# Patient Record
Sex: Female | Born: 1970 | Race: Black or African American | Hispanic: No | Smoking: Never smoker
Health system: Southern US, Community
[De-identification: ages and names within clinical notes are randomized; demographics above are authoritative.]

## PROBLEM LIST (undated history)

## (undated) DIAGNOSIS — B009 Herpesviral infection, unspecified: Secondary | ICD-10-CM

## (undated) DIAGNOSIS — Z9109 Other allergy status, other than to drugs and biological substances: Secondary | ICD-10-CM

## (undated) DIAGNOSIS — F419 Anxiety disorder, unspecified: Secondary | ICD-10-CM

## (undated) DIAGNOSIS — T7840XA Allergy, unspecified, initial encounter: Secondary | ICD-10-CM

## (undated) DIAGNOSIS — M1990 Unspecified osteoarthritis, unspecified site: Secondary | ICD-10-CM

## (undated) DIAGNOSIS — Z8619 Personal history of other infectious and parasitic diseases: Secondary | ICD-10-CM

## (undated) DIAGNOSIS — N898 Other specified noninflammatory disorders of vagina: Secondary | ICD-10-CM

## (undated) DIAGNOSIS — T192XXA Foreign body in vulva and vagina, initial encounter: Secondary | ICD-10-CM

## (undated) DIAGNOSIS — K429 Umbilical hernia without obstruction or gangrene: Secondary | ICD-10-CM

## (undated) DIAGNOSIS — Z862 Personal history of diseases of the blood and blood-forming organs and certain disorders involving the immune mechanism: Secondary | ICD-10-CM

## (undated) DIAGNOSIS — N926 Irregular menstruation, unspecified: Secondary | ICD-10-CM

## (undated) DIAGNOSIS — Z309 Encounter for contraceptive management, unspecified: Secondary | ICD-10-CM

## (undated) DIAGNOSIS — D649 Anemia, unspecified: Secondary | ICD-10-CM

## (undated) HISTORY — DX: Other specified noninflammatory disorders of vagina: N89.8

## (undated) HISTORY — DX: Other allergy status, other than to drugs and biological substances: Z91.09

## (undated) HISTORY — DX: Allergy, unspecified, initial encounter: T78.40XA

## (undated) HISTORY — DX: Foreign body in vulva and vagina, initial encounter: T19.2XXA

## (undated) HISTORY — DX: Anxiety disorder, unspecified: F41.9

## (undated) HISTORY — PX: HERNIA REPAIR: SHX51

## (undated) HISTORY — PX: CERVICAL CERCLAGE: SHX1329

## (undated) HISTORY — DX: Unspecified osteoarthritis, unspecified site: M19.90

## (undated) HISTORY — DX: Umbilical hernia without obstruction or gangrene: K42.9

## (undated) HISTORY — DX: Irregular menstruation, unspecified: N92.6

## (undated) HISTORY — DX: Personal history of diseases of the blood and blood-forming organs and certain disorders involving the immune mechanism: Z86.2

## (undated) HISTORY — DX: Personal history of other infectious and parasitic diseases: Z86.19

## (undated) HISTORY — DX: Encounter for contraceptive management, unspecified: Z30.9

## (undated) HISTORY — DX: Herpesviral infection, unspecified: B00.9

## (undated) HISTORY — DX: Anemia, unspecified: D64.9

---

## 2001-09-03 ENCOUNTER — Emergency Department (HOSPITAL_COMMUNITY): Admission: EM | Admit: 2001-09-03 | Discharge: 2001-09-03 | Payer: Self-pay | Admitting: *Deleted

## 2001-12-27 ENCOUNTER — Emergency Department (HOSPITAL_COMMUNITY): Admission: EM | Admit: 2001-12-27 | Discharge: 2001-12-27 | Payer: Self-pay | Admitting: *Deleted

## 2002-01-31 ENCOUNTER — Inpatient Hospital Stay (HOSPITAL_COMMUNITY): Admission: EM | Admit: 2002-01-31 | Discharge: 2002-01-31 | Payer: Self-pay | Admitting: *Deleted

## 2002-06-12 ENCOUNTER — Encounter: Payer: Self-pay | Admitting: Emergency Medicine

## 2002-06-12 ENCOUNTER — Emergency Department (HOSPITAL_COMMUNITY): Admission: EM | Admit: 2002-06-12 | Discharge: 2002-06-12 | Payer: Self-pay | Admitting: Emergency Medicine

## 2002-07-26 ENCOUNTER — Emergency Department (HOSPITAL_COMMUNITY): Admission: EM | Admit: 2002-07-26 | Discharge: 2002-07-26 | Payer: Self-pay | Admitting: Internal Medicine

## 2003-05-07 ENCOUNTER — Observation Stay (HOSPITAL_COMMUNITY): Admission: AD | Admit: 2003-05-07 | Discharge: 2003-05-08 | Payer: Self-pay | Admitting: Obstetrics and Gynecology

## 2003-05-30 ENCOUNTER — Ambulatory Visit (HOSPITAL_COMMUNITY): Admission: RE | Admit: 2003-05-30 | Discharge: 2003-05-31 | Payer: Self-pay | Admitting: Obstetrics and Gynecology

## 2003-05-31 ENCOUNTER — Ambulatory Visit (HOSPITAL_COMMUNITY): Admission: AD | Admit: 2003-05-31 | Discharge: 2003-05-31 | Payer: Self-pay | Admitting: Obstetrics and Gynecology

## 2003-07-11 ENCOUNTER — Ambulatory Visit (HOSPITAL_COMMUNITY): Admission: AD | Admit: 2003-07-11 | Discharge: 2003-07-11 | Payer: Self-pay | Admitting: Obstetrics and Gynecology

## 2003-08-15 ENCOUNTER — Ambulatory Visit (HOSPITAL_COMMUNITY): Admission: RE | Admit: 2003-08-15 | Discharge: 2003-08-15 | Payer: Self-pay | Admitting: Obstetrics and Gynecology

## 2003-08-31 ENCOUNTER — Inpatient Hospital Stay (HOSPITAL_COMMUNITY): Admission: AD | Admit: 2003-08-31 | Discharge: 2003-09-02 | Payer: Self-pay | Admitting: Obstetrics and Gynecology

## 2004-09-20 ENCOUNTER — Ambulatory Visit (HOSPITAL_COMMUNITY): Admission: RE | Admit: 2004-09-20 | Discharge: 2004-09-20 | Payer: Self-pay | Admitting: Obstetrics and Gynecology

## 2004-10-02 ENCOUNTER — Emergency Department (HOSPITAL_COMMUNITY): Admission: EM | Admit: 2004-10-02 | Discharge: 2004-10-02 | Payer: Self-pay | Admitting: *Deleted

## 2005-02-20 ENCOUNTER — Inpatient Hospital Stay (HOSPITAL_COMMUNITY): Admission: RE | Admit: 2005-02-20 | Discharge: 2005-02-22 | Payer: Self-pay | Admitting: Obstetrics and Gynecology

## 2007-07-25 ENCOUNTER — Other Ambulatory Visit: Admission: RE | Admit: 2007-07-25 | Discharge: 2007-07-25 | Payer: Self-pay | Admitting: Obstetrics and Gynecology

## 2007-12-12 ENCOUNTER — Other Ambulatory Visit: Admission: RE | Admit: 2007-12-12 | Discharge: 2007-12-12 | Payer: Self-pay | Admitting: Obstetrics and Gynecology

## 2009-02-04 ENCOUNTER — Other Ambulatory Visit: Admission: RE | Admit: 2009-02-04 | Discharge: 2009-02-04 | Payer: Self-pay | Admitting: Obstetrics and Gynecology

## 2009-02-13 ENCOUNTER — Emergency Department (HOSPITAL_COMMUNITY): Admission: EM | Admit: 2009-02-13 | Discharge: 2009-02-13 | Payer: Self-pay | Admitting: Emergency Medicine

## 2009-04-16 ENCOUNTER — Ambulatory Visit (HOSPITAL_COMMUNITY): Admission: RE | Admit: 2009-04-16 | Discharge: 2009-04-16 | Payer: Self-pay | Admitting: Obstetrics and Gynecology

## 2009-05-17 ENCOUNTER — Ambulatory Visit: Payer: Self-pay | Admitting: Obstetrics and Gynecology

## 2009-05-17 ENCOUNTER — Inpatient Hospital Stay (HOSPITAL_COMMUNITY): Admission: AD | Admit: 2009-05-17 | Discharge: 2009-05-18 | Payer: Self-pay | Admitting: Obstetrics and Gynecology

## 2009-09-14 ENCOUNTER — Ambulatory Visit: Payer: Self-pay | Admitting: Obstetrics & Gynecology

## 2009-09-14 ENCOUNTER — Inpatient Hospital Stay (HOSPITAL_COMMUNITY): Admission: AD | Admit: 2009-09-14 | Discharge: 2009-09-17 | Payer: Self-pay | Admitting: Obstetrics & Gynecology

## 2010-06-23 ENCOUNTER — Other Ambulatory Visit: Admission: RE | Admit: 2010-06-23 | Discharge: 2010-06-23 | Payer: Self-pay | Admitting: Obstetrics and Gynecology

## 2011-01-11 LAB — CBC
HCT: 31.1 % — ABNORMAL LOW (ref 36.0–46.0)
HCT: 36.5 % (ref 36.0–46.0)
Hemoglobin: 10.3 g/dL — ABNORMAL LOW (ref 12.0–15.0)
MCHC: 32.9 g/dL (ref 30.0–36.0)
Platelets: 197 10*3/uL (ref 150–400)
RBC: 3.54 MIL/uL — ABNORMAL LOW (ref 3.87–5.11)
RBC: 4.2 MIL/uL (ref 3.87–5.11)
WBC: 11.4 10*3/uL — ABNORMAL HIGH (ref 4.0–10.5)

## 2011-01-16 LAB — CBC
Hemoglobin: 12.6 g/dL (ref 12.0–15.0)
MCHC: 34.3 g/dL (ref 30.0–36.0)
MCV: 83.3 fL (ref 78.0–100.0)
RBC: 4.39 MIL/uL (ref 3.87–5.11)

## 2011-01-16 LAB — PROTIME-INR: Prothrombin Time: 13.2 seconds (ref 11.6–15.2)

## 2011-01-18 LAB — URINALYSIS, ROUTINE W REFLEX MICROSCOPIC
Bilirubin Urine: NEGATIVE
Glucose, UA: NEGATIVE mg/dL
Hgb urine dipstick: NEGATIVE
Ketones, ur: NEGATIVE mg/dL
Protein, ur: NEGATIVE mg/dL
Urobilinogen, UA: 0.2 mg/dL (ref 0.0–1.0)

## 2011-02-22 NOTE — Op Note (Signed)
NAMELUCIEL, BRICKMAN NO.:  [id number]   MEDICAL RECORD NO.:  [id number]          PATIENT TYPE:  AMB   LOCATION:  DAY                           FACILITY:  [facility]   PHYSICIAN:  Tilda Burrow, M.D. DATE OF BIRTH:  September 27, 1971   DATE OF PROCEDURE:  04/16/2009  DATE OF DISCHARGE:                               OPERATIVE REPORT   PREOPERATIVE DIAGNOSES:  Pregnancy at [redacted] weeks gestation, history of  cervical incompetency, status post prior pregnancies with McDonald  cerclage.   POSTOPERATIVE DIAGNOSES:  Pregnancy at 37 weeks' gestation, history of  cervical incompetency, status post prior pregnancies with McDonald  cerclage.   PROCEDURE:  McDonald cerclage placement.   SURGEON:  Tilda Burrow, MD   ASSISTANTMarlinda Mike, RN   ANESTHESIA:  Spinal.   COMPLICATIONS:  None.   FINDINGS:  Soft, bulbous, multiparous cervix, visually appears closed.   DETAILS OF THE PROCEDURE:  The patient was taken to the operating room,  prepped and draped for vaginal procedure after spinal anesthesia  introduced.  The legs were placed in high-lithotomy support.  Perineum  was prepped and draped, and after time-out was conducted, procedure was  confirmed, agreed upon by all parties, and IV antibiotics administered.  A Cowles speculum was inserted in the vagina, allowing visualization of  the cervix, which was soft, multiparous, and bulbous.  The cervix was  circumscribed with a clockwise placed suture of 0 Vicryl.  The 0 Vicryl  was tied down at approximately 11 o'clock around the cervix with some  reduction in the multiparous size of the cervix.  The patient tolerated  this well.  Once the suture had been tied down, we inspected for  hemostasis, confirmed once again, there was no significant additional  bleeding.  The amount of light bleeding that occurred less  than 25 mL had been cleansed out of the surgical field.  The patient  tolerated the procedure well with no discomfort  noted in recovery.  Fetal Rh will be confirmed in the recovery prior to discharge.  Received  a single dose of Procardia XL 30 mg prior to discharge.      Tilda Burrow, M.D.  Electronically Signed     JVF/MEDQ  D:  04/16/2009  T:  04/17/2009  Job:  [id number]   cc:   [facility]

## 2011-02-22 NOTE — H&P (Signed)
Donna [name], TAKEMOTO NO.:  [id number]   MEDICAL RECORD NO.:  [id number]          PATIENT TYPE:  AMB   LOCATION:  DAY                           FACILITY:  [facility]   PHYSICIAN:  Tilda Burrow, M.D. DATE OF BIRTH:  Feb 14, 1971   DATE OF ADMISSION:  DATE OF DISCHARGE:  LH                              HISTORY & PHYSICAL   ADMITTING DIAGNOSES:  Pregnancy 17 weeks' gestation, history of cervical  incompetency scheduled for McDonald cerclage.   HISTORY OF PRESENT ILLNESS:  This 40 year old female gravida 6, para 2,  AB 4 with 2 living children is admitted at this time for placement of  McDonald cerclage.  Prenatal course has been followed through [facility] with pregnancy course notable for blood type O positive.  Rubella immunity present.  Varicella immunity present.  Cystic fibrosis  screen negative.  Hemoglobin 12, hematocrit 37, platelets 289,000.  Hepatitis, HIV, RPR, GC, and Chlamydia all negative.  Sickledex is also  negative.  Due to the history of cervical incompetency, the patient is  scheduled for McDonald cerclage.  Prior pregnancies have had the  cerclage placed at 22 weeks with 2004 pregnancy when she was presented  with funneling of the cervix.  She had had a prior late first trimester  miscarriage which presented with bulging bag of waters with minimal  discomfort.  The final pregnancy in 2006 was addressed with McDonald  cerclage prophylactically at 37 weeks and she had the cerclage removed  at 37 weeks and delivered at 38-1/2 weeks.  She is admitted at this time  for McDonald cerclage placement.  The procedure was delayed 2 weeks due  to ultrasound at 14 to 15 weeks which showed the placenta remained low  lying position, not involving the previa.  Ultrasound on April 15, 2009  shows the placenta is at least 4 cm away from the internal os and there  is no funneling present.  The cervical length was 3.73 cm at this time.  McDonald cerclage is  scheduled.   PAST MEDICAL HISTORY:  Benign.   SURGICAL HISTORY:  McDonald cerclage x2.   PHYSICAL EXAMINATION:  GENERAL:  A healthy African American female  alert, oriented x3.  VITAL SIGNS:  Height 5.1, weight 156, blood pressure 120/80, pulse 80.  EYES:  Pupils equal, round, and reactive.  CARDIOVASCULAR:  Unremarkable.  ABDOMEN:  Nontender.  Uterine size consistent 17 weeks.  Ultrasound  confirms vertex presentation.  Fetal heart position noted and feral  activity noted.  The cervix is closed at 3 cm length.   PLAN:  McDonald cerclage single stitch to be placed on April 16, 2009, at  11 a.m.      Tilda Burrow, M.D.  Electronically Signed     JVF/MEDQ  D:  04/15/2009  T:  04/16/2009  Job:  [id number]   cc:   [facility]

## 2011-02-25 NOTE — Op Note (Signed)
NAMEADILENY, Donna [name]                         ACCOUNT NO.:  [id number]   MEDICAL RECORD NO.:  [id number]                   PATIENT TYPE:  INP   LOCATION:  A416                                 FACILITY:  [facility]   PHYSICIAN:  Tilda Burrow, M.D.              DATE OF BIRTH:  Feb 17, 1971   DATE OF PROCEDURE:  08/31/2003  DATE OF DISCHARGE:                                 OPERATIVE REPORT   DELIVERY TIME:  Noon, approximately.   LABOR SUMMARY AND DELIVERY NOTE:  [name] progressed steadily through the  morning in spontaneous labor.  She had a single variable deceleration  shortly after her membrane rupture but otherwise, the heart rate tracing  remained excellent with only mild variables, not clinically of concern.  The  patient progressed to completely dilated and pushed through a brief second  stage of less than 30 minutes, delivering over an intact perineum, direct OA  position, a healthy female infant, weight _______, Apgars 9 and 9.  Amniotic  fluid was clear.  Placenta was then delivered easily, Schultze presentation,  membranes apparently intact.  First-degree lacerations did not require  suture repair.  Delivery was attended by the patient's partner, Dorann Ou  and one other family member with good support and good family interaction.      ___________________________________________                                            Tilda Burrow, M.D.   JVF/MEDQ  D:  08/31/2003  T:  08/31/2003  Job:  [id number]   cc:   Francoise Schaumann. Halm, D.O.  [address]  [place]  [place] [address]  Fax: [phone]

## 2011-02-25 NOTE — Op Note (Signed)
NAMEKEVA, DARTY NO.:  [id number]   MEDICAL RECORD NO.:  [id number]          PATIENT TYPE:  AMB   LOCATION:  DAY                           FACILITY:  [facility]   PHYSICIAN:  Tilda Burrow, M.D. DATE OF BIRTH:  Feb 11, 1971   DATE OF PROCEDURE:  09/20/2004  DATE OF DISCHARGE:                                 OPERATIVE REPORT   PREOPERATIVE DIAGNOSES:  1.  Pregnancy of 17 weeks.  2.  History of cervix incompetency.   POSTOPERATIVE DIAGNOSES:  1.  Pregnancy of 17 weeks.  2.  History of cervix incompetency.   PROCEDURE:  McDonald's cerclage.   SURGEON:  Tilda Burrow, M.D.   ASSISTANT:  Chinita Pester, C.S.T.   ANESTHESIA:  Spinal, Tyrone Nine.   COMPLICATIONS:  None.   ESTIMATED BLOOD LOSS:  Minimal.   FINDINGS:  Cervix soft, closed.   INDICATIONS FOR PROCEDURE:  A 40 year old female with a history of cervical  incompetency requiring a cerclage in the past.   BLOOD TYPE:  O-positive.   DESCRIPTION OF PROCEDURE:  The patient was taken to the operating room.  Spinal anesthesia was introduced.  The patient was prepped and draped with  low lithotomy leg support.  A large Duskin speculum was inserted into the  vagina which had been previously prepped with Betadine solution.  The cervix  was then encircled with an elliptical stitch of #1 Prolene thrown in a  series of pursestring-type sutures, with knot tied down, snugging  up the cervix and showing minimal bleeding associated with the procedure.  It was felt that one stitch was sufficient, and the patient was therefore  allowed to go to the recovery room.  Again, the blood type is O-positive.     John   JVF/MEDQ  D:  09/20/2004  T:  09/20/2004  Job:  [id number]

## 2011-02-25 NOTE — H&P (Signed)
NAME:  Donna [name], Donna [name] NO.:  [id number]   MEDICAL RECORD NO.:  [id number]                   PATIENT TYPE:  INP   LOCATION:  LDR2                                 FACILITY:  [facility]   PHYSICIAN:  Tilda Burrow, M.D.              DATE OF BIRTH:  April 29, 1971   DATE OF ADMISSION:  08/31/2003  DATE OF DISCHARGE:                                HISTORY & PHYSICAL   ADMISSION DIAGNOSES:  Pregnancy 39 weeks' gestation, latent phase labor,  history of McDonald's cerclage removed at 36 weeks.   HISTORY:  This 40 year old female, gravida 4, para 3, AB0 with suspected  cervical incompetency based on funneling noted on ultrasound at 35 weeks'  gestation treated with McDonald cerclage, which was maintained until 36  weeks and removed on August 06, 2003, was admitted on the morning of  August 31, 2003 after presenting with mild uterine contractions, intact  membranes.  Her prenatal course otherwise did well.   The patient was admitted at 3:00 a.m. with admitting evaluation including  cervical dilation to 1 cm, 50%, -2 with irregular contractions noted.  She  has since then progressed nicely.   PAST MEDICAL HISTORY:  Benign surgical history.   ALLERGIES:  No known drug allergies.   SOCIAL HISTORY:  Single, supportive father of the baby with numerous support  of family members.   OBSTETRICAL HISTORY:  Notable for pregnancy loses in the first trimester in  '92, 2002 and 2003.   PHYSICAL EXAMINATION:  GENERAL:  A healthy-appearing African-American  female, alert and oriented x3.  HEENT:  Pupils are equal, round and reactive to light.  NECK:  Supple.  PELVIC:  Nontender uterus at 36 cm at last visit.  Estimated fetal weight 6  pounds. Cervix by initial nursing evaluation was 170, 50%, -2, progressing  to 6 cm, 80%, -2 with bulging bag of waters well past the vertex.  At the  time of my initial examination at 8:50 a.m. where clear fluid was obtained,  fetal heart  rate tracing shows diminished beat to beat but no decelerations  and regular uterine contractions.   PRENATAL LABORATORY DATA:  Blood type 0 positive, Rubella immunity present.  Hemoglobin and hematocrit 12.8 and 39.1.  Hepatitis, HIV, GC, Chlamydia, RPR  all negative.   PLAN:  Vaginal delivery.     ___________________________________________                                         Tilda Burrow, M.D.   JVF/MEDQ  D:  08/31/2003  T:  08/31/2003  Job:  [id number]

## 2011-02-25 NOTE — H&P (Signed)
NAMEDANEAN, MARNER NO.:  [id number]   MEDICAL RECORD NO.:  [id number]          PATIENT TYPE:  INP   LOCATION:  LDR2                          FACILITY:  [facility]   PHYSICIAN:  Lazaro Arms, M.D.   DATE OF BIRTH:  24-Sep-1971   DATE OF ADMISSION:  02/20/2005  DATE OF DISCHARGE:  LH                                HISTORY & PHYSICAL   Donna [name] is a 40 year old African-American female, gravida 5, para 1, abortus  3, with estimated date of delivery of Feb 28, 2005, currently at 38-6/[redacted]  weeks gestation with antepartum course complicated by positive chlamydia  culture on March 24 which was treated.  She also had placement of a McDonald  cerclage on September 20, 2004, due to a history of questionable incompetent  cervix and cervical shortening.  She again had the McDonald cerclage removed  on January 26, 2005.  She comes in today complaining of uterine activity.  She  initially was having uterine activity about every 15 minutes, and her cervix  was about 1-1/2 to 2 cm and thin.  She really did not appear all that  uncomfortable.  Then she had a couple of variable decelerations, and we  decided to keep her.  She then sat up in a chair, and her labor quickly  became more regular, and she was 5 cm.  She is admitted for routine labor  management.   PAST MEDICAL HISTORY:  Negative.   PAST SURGICAL HISTORY:  Negative.   PAST OBSTETRICAL HISTORY:  She had miscarriages in 1992, 2002, and 2003, and  a spontaneous vaginal delivery in 2004 at 39-1/2 weeks, 6 pounds 4 ounces.  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   PRENATAL LABORATORY DATA:  Blood type is O positive. Antibody screen is  negative.  Urine drug screen was negative.  Rubella immune.  Hepatitis B was  negative.  HIV was nonreactive.  Serology was nonreactive.  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  Her Glucola was normal at 120.  Her hemoglobin and  hematocrit were 11.3 and 35.6, respectively.   PHYSICAL EXAMINATION:  HEENT: Unremarkable.  NECK:  Thyroid clear.  HEART:  Regular rate and rhythm without regurgitation or gallop.  BREASTS:  Deferred.  ABDOMEN:  Fundal height in the office was 34 cm.  PELVIC:  Cervix is now 5, 100, and 0 to 1+ station.  EXTREMITIES:  Warm with no edema.   IMPRESSION:  1.  Intrauterine pregnancy at 38-6/[redacted] weeks gestation.  2.  Active labor.   PLAN:  The patient is admitted for labor management.  She will receive IV  pain medications and is requesting an epidural as well.      LHE/MEDQ  D:  02/20/2005  T:  02/20/2005  Job:  [id number]

## 2011-02-25 NOTE — Op Note (Signed)
   NAMEKATANA, BERTHOLD                         ACCOUNT NO.:  [id number]   MEDICAL RECORD NO.:  [id number]                   PATIENT TYPE:  OBV   LOCATION:  A423                                 FACILITY:  [facility]   PHYSICIAN:  Tilda Burrow, M.D.              DATE OF BIRTH:  1971/01/31   DATE OF PROCEDURE:  05/08/2003  DATE OF DISCHARGE:                                 OPERATIVE REPORT   PREOPERATIVE DIAGNOSIS:  Suspected cervical incompetence.   POSTOPERATIVE DIAGNOSIS:  Suspected cervical incompetence.   PROCEDURE:  McDonald's Cerclage   SURGEON:  Tilda Burrow, M.D.   ASSISTANT:  None   ANESTHESIA:  Spinal   COMPLICATIONS:  None.   FINDINGS:  Short cervix, soft with easily inserted once McDonald's Cerclage.   DETAILS OR PROCEDURE:  The patient was taken to the operating room, prepped  and draped after spinal anesthesia was placed and patient allowed to sit for  three minutes to achieve a caudal Dermatome analgesic level.  The patient  had vaginal prepping, and draping, then we placed a large bivalve Vallery  speculum inside the vagina allowing identification of the cervix.  The  cervical edge could be grasped with the Allis clamp sufficiently to begin to  place the stitches using a #1 Prolene suture on a small half circle needle.  We proceeded to weave a pursestring around the cervix as high as possible in  the cervix in the vagina.  We were able to tie the string on and constrict  the cervical tissues nicely.  We tied this down with five or six knots.  The  patient had no suspicion of membrane rupture, no additional problems and was  stable for transfer to the recovery room without difficulty.   The patient remained quite anxious throughout the procedure even after  completed but she had no contractions perceived.                                               Tilda Burrow, M.D.    JVF/MEDQ  D:  05/08/2003  T:  05/09/2003  Job:  [id number]

## 2011-02-25 NOTE — H&P (Signed)
NAMECHARON, Donna [name] NO.:  [id number]   MEDICAL RECORD NO.:  [id number]           PATIENT TYPE:   LOCATION:                                 FACILITY:   PHYSICIAN:  Tilda Burrow, M.D.      DATE OF BIRTH:   DATE OF ADMISSION:  DATE OF DISCHARGE:  LH                                HISTORY & PHYSICAL   ADMITTING DIAGNOSES:  1.  Pregnancy 17 weeks 2 days.  2.  History of preterm cervical dilation necessitating McDonald cerclage.  3.  Recurrent cervical incompentency.   HISTORY OF PRESENT ILLNESS:  This 40 year old female, gravida 5, para 1, AB  3 had her McDonald cerclage, originally scheduled for 09/09/2004,  rescheduled for 09/20/2004.  The patient cancelled in order to keep an  appointment for a job interview and has dome back at this time for  rescheduling. She is seen today 09/17/2004 confirming her continued desired  for cerclage. She has had internal exam showing cervix is unchanged,  posteriorly oriented 2-3 cm long cervix protruding into the vaginal vault  with no visible dilation of the external os.  The risks of the procedure are  reviewed.  Fetal heart tones are 154 and abdomen is nontender.  Will proceed  with McDonald cerclage on 09/20/2004.   PRENATAL LABS:  Include HIV negative, hepatitis B negative, rubella immunity  present, RPR nonreactive, hemoglobin 12, hematocrit 38, blood type A  positive, antibody screen negative, sickle index negative.     John   JVF/MEDQ  D:  09/17/2004  T:  09/17/2004  Job:  [id number]   cc:   Tilda Burrow, M.D.  [address] [place]  [place] [address]  Fax: [phone]

## 2011-02-25 NOTE — H&P (Signed)
Donna [name], RADER NO.:  [id number]   MEDICAL RECORD NO.:  [id number]          PATIENT TYPE:  AMB   LOCATION:  DAY                           FACILITY:  [facility]   PHYSICIAN:  Tilda Burrow, M.D. DATE OF BIRTH:  August 11, 1971   DATE OF ADMISSION:  DATE OF DISCHARGE:  LH                                HISTORY & PHYSICAL   ADMITTING DIAGNOSES:  1.  Pregnancy, 15-1/[redacted] weeks gestation.  2.  History of preterm cervical dilation precipitating McDonald's cerclage.  3.  Recurrent cervical incompetency.   HISTORY OF PRESENT ILLNESS:  This 40 year old female, gravida 5, para 1, AB  3 with a history of McDonald's cerclage, required during her last pregnancy,  which ended at [redacted] weeks gestation after cerclage removal, is admitted at 5-  1/[redacted] weeks gestation with her current pregnancy, for placement of McDonald's  cerclage.  The patient has been seen in our office on November 23 and  September 06, 2004, and cervical ultrasound on September 06, 2004, shows  already a small degree of funneling present at the internal os, with  approximately 2.9 cm of cervix remaining closed.  The opening of the  funneling effect is 1.5 cm in transverse diameter.  The amount of cervix  funneling is less than 1 cm depth.  The patient is aware I believe that  cerclage represents her best chance at carrying the pregnancy to term.  Alternatives such as prolonged bedrest are mentioned, but cerclage is  recommended.  The patient understands this recommendation, including the  specific mention of the risk of membrane rupture and pregnancy loss which is  an inherent risk associated with the surgical procedure.   PAST MEDICAL HISTORY:  Benign.  Surgical history negative.   ALLERGIES:  None.   SOCIAL HISTORY:  She is single.  She lives with the baby's father, Ardine Eng.   PHYSICAL EXAMINATION:  GENERAL:  A health African American female who  appears her stated age.  VITAL SIGNS:  Height 5 feet, 3  inches. Weight 126.  Blood pressure 120/42.  HEENT:  Pupils are equal, round and reactive.  Extraocular movements intact.  NECK:  Supple.  Trachea midline.  CHEST:  Clear to auscultation.  ABDOMEN:  Nontender.  Uterus mid way to the umbilicus.  Fetal heart tones  are noted.   LABORATORY DATA:  Prenatal labs ordered September 01, 2004.   PLAN:  McDonald cerclage, September 09, 2004.     John   JVF/MEDQ  D:  09/07/2004  T:  09/07/2004  Job:  [id number]   cc:   Tilda Burrow, M.D.  [address] [place]  [place] [address]  Fax: [phone]

## 2011-02-25 NOTE — Op Note (Signed)
Donna, [name] NO.:  [id number]   MEDICAL RECORD NO.:  [id number]          PATIENT TYPE:  INP   LOCATION:  A413                          FACILITY:  [facility]   PHYSICIAN:  Lazaro Arms, M.D.   DATE OF BIRTH:  10-12-1970   DATE OF PROCEDURE:  02/20/2005  DATE OF DISCHARGE:                                 OPERATIVE REPORT   PROCEDURE:  Epidural placement note.   INDICATIONS:  Donna [name] is a 40 year old gravida 5, para 1, AB 3, 38-/6/[redacted] weeks  gestation,active phase of labor,  requesting an epidural be placed.   DESCRIPTION OF PROCEDURE:  She was placed in the sitting position.  Betadine  prep was used.  L2 and L3 interspaces identified.  One percent lidocaine was  injected and area sterilely draped.  A 17-gauge Tuohy needle was used and  loss-of-resistance technique employed.  The epidural space was found with  one pass without difficulty.  Bupivacaine 0.125% , 10 mL, was given through  the epidural catheter.  An  additional 10 mL was given to dose the epidural.  The epidural catheter is taped down 5 cm from the epidural space.  Continuous infusion is begun at 12 mL an hour of  0.125% bupivacaine with 4  mcg per mL of fentanyl . The patient is tolerating it well, getting  comfortable.  Fetal heart tracing is stable.      LHE/MEDQ  D:  02/20/2005  T:  02/21/2005  Job:  [id number]

## 2011-02-25 NOTE — Op Note (Signed)
NAMEGIAH, FICKETT NO.:  [id number]   MEDICAL RECORD NO.:  [id number]          PATIENT TYPE:  INP   LOCATION:  A413                          FACILITY:  [facility]   PHYSICIAN:  Lazaro Arms, M.D.   DATE OF BIRTH:  19-Oct-1970   DATE OF PROCEDURE:  02/20/2005  DATE OF DISCHARGE:                                 OPERATIVE REPORT   DELIVERY NOTE:  Norris progressed well through the active phase of labor.  Really did not have the urge to push. We let her just kind of rest for  awhile and finally had her push and she had a great pushing effort.  She  delivered over an intact perineum a viable female at 2118 with Apgars of 9  and 9, weighing 5 pounds 9 ounces.  There was three-vessel cord.  Cord blood  and cord gas were sent.  The perineum was intact.  Interestingly the  placenta did not separate.  It had no gushes of blood.  No lengthening of  cord.  Nothing.  As a result, I injected 100 units of Pitocin into umbilical  cord after 30 minutes of no separation at all.  At that point I waited five  minutes and when that did not work, I proceeded with a manual extraction of  the placenta and, in fact, placenta was anterior and had not separated at  all.  I got a plane between the placenta and the uterus and little by  little, separated placenta off and delivered the placenta.  The uterus was  firm.  There was not excessive bleeding and the placenta was examined  closely and all cotyledons were attached and it appeared to be complete.  The patient will get Rocephin 1 g IV after delivery because of the manual  extraction of the placenta.  The patient will undergo routine postpartum  care.      LHE/MEDQ  D:  02/20/2005  T:  02/21/2005  Job:  [id number]

## 2011-02-25 NOTE — Discharge Summary (Signed)
[facility]  Patient:    Donna [name], Donna [name] Visit Number: [id number] MRN: [id number]          Service Type: OBS Location: 4A A427 [id number] Attending Physician:  Tilda Burrow Dictated by:   Christin Bach, M.D. Admit Date:  01/31/2002 Discharge Date: 01/31/2002                             Discharge Summary  ADMISSION DIAGNOSIS:  Incomplete abortion.  DISCHARGE DIAGNOSIS:  Incomplete abortion, resolved.  CONSULTATIONS:  ER consult, 4 a.m., January 31, 2002, Dr. Zeb Comfort. PROCEDURES:  Removal of retained tissue from uterus in labor and delivery, 10 a.m., on January 31, 2002, Dr. Zeb Comfort.  DISCHARGE MEDICATIONS:  Birth control pills to be begun Sunday.  The patient has three packs at home.  FOLLOW-UP:  In one week, Zerita Boers.  HISTORY OF PRESENT ILLNESS:  This 40 year old female, gravida 2, para 1, at 12+ weeks gestation having sought prenatal care through our office and seen for initial prenatal assessment three days prior to the emergency room visit, was admitted after being evaluated in the emergency room where she was complaining of bleeding.  Seen in the ER by the emergency room physician and accompanying nurse.  At the time of speculum exam she had bulging bag of waters which ruptured upon speculum insertion and expelled a fetus consistent with [redacted] weeks gestation.  The patient had denied any problems and had had full GYN exam including ultrasound and speculum exam three days ago in the office which showed no evidence of cervical dilation or bleeding.  She was found to have fetal heart motion present on ultrasound December 28, 2001.  She was admitted at 4 a.m. for IV oxytocin and consideration of D&C.  PAST MEDICAL HISTORY:  Benign.  ALLERGIES:  No known drug allergies.  PAST SURGICAL HISTORY:  None.  OBSTETRICAL HISTORY:  Spontaneous miscarriage at 10-11 weeks not requiring D&C, felt to be early first-trimester loss, significantly  earlier than this gestation.  PHYSICAL EXAMINATION:  GENERAL:  Slim African-American female.  Alert and oriented x3.  Accompanied by her boyfriend, who is significantly upset by the procedure.  CHEST:  Clear.  ABDOMEN:  Soft.  PELVIC:  External genitalia shows heavy blood.  Cervix 1 cm, with generous blood, 200 cc removed from vault.  Uterus 8-10 weeks size.  LABORATORY DATA:  Blood type O positive.  Hemoglobin 12, hematocrit 37.  ASSESSMENT:  Incomplete abortion.  HOSPITAL COURSE:  During observation after admission at 4 a.m., the patient received IV oxytocin x6 hours and was examined, and the cervix was approximately 2 cm dilated.  Speculum exam allowed visualization of tissue just inside the os, and ring forceps passed inside the cervix would allow grasping the tissue, and the patient was able to Valsalva, with the tissue gradually extracted.  Ring forceps probing of the uterus confirmed that only minimal tissue fragments remained, and those were extracted.  An additional 100 cc of bleeding occurred.  The patient was then observed for two more hours, with cessation of bleeding, resolution of any cramping, and she was discharged home.  She plans birth control pills for at least three months before resuming efforts at conception.  She may require progesterone levels early in pregnancy to assist with evaluation of pregnancy viability.  The possibility of an extremely preterm cervical incompetency cannot be ruled out based on the history of this visit and would  need to be considered in the future.Dictated by:   Christin Bach, M.D. Attending Physician:  Tilda Burrow DD:  01/31/02 TD:  01/31/02 Job: [id number]

## 2011-11-21 ENCOUNTER — Other Ambulatory Visit: Payer: Self-pay | Admitting: Adult Health

## 2011-11-21 ENCOUNTER — Other Ambulatory Visit (HOSPITAL_COMMUNITY)
Admission: RE | Admit: 2011-11-21 | Discharge: 2011-11-21 | Disposition: A | Discharge status: Home / Self Care | Payer: Medicaid Other | Source: Ambulatory Visit | Attending: Obstetrics and Gynecology | Admitting: Obstetrics and Gynecology

## 2011-11-21 DIAGNOSIS — Z01419 Encounter for gynecological examination (general) (routine) without abnormal findings: Secondary | ICD-10-CM | POA: Insufficient documentation

## 2012-01-20 ENCOUNTER — Encounter (HOSPITAL_COMMUNITY)
Admission: RE | Admit: 2012-01-20 | Discharge: 2012-01-20 | Disposition: A | Discharge status: Home / Self Care | Payer: Medicaid Other | Source: Ambulatory Visit | Attending: General Surgery | Admitting: General Surgery

## 2012-01-20 ENCOUNTER — Encounter (HOSPITAL_COMMUNITY): Payer: Self-pay

## 2012-01-20 LAB — BASIC METABOLIC PANEL
BUN: 7 mg/dL (ref 6–23)
CO2: 25 mEq/L (ref 19–32)
Chloride: 106 mEq/L (ref 96–112)
GFR calc non Af Amer: 76 mL/min — ABNORMAL LOW (ref >90)
Glucose, Bld: 95 mg/dL (ref 70–99)
Potassium: 3.8 mEq/L (ref 3.5–5.1)
Sodium: 138 mEq/L (ref 135–145)

## 2012-01-20 LAB — CBC
HCT: 33.3 % — ABNORMAL LOW (ref 36.0–46.0)
Hemoglobin: 10.4 g/dL — ABNORMAL LOW (ref 12.0–15.0)
MCH: 23.9 pg — ABNORMAL LOW (ref 26.0–34.0)
MCV: 76.4 fL — ABNORMAL LOW (ref 78.0–100.0)
RBC: 4.36 MIL/uL (ref 3.87–5.11)

## 2012-01-20 LAB — DIFFERENTIAL
Eosinophils Absolute: 0.2 10*3/uL (ref 0.0–0.7)
Lymphs Abs: 1.8 10*3/uL (ref 0.7–4.0)
Monocytes Absolute: 0.6 10*3/uL (ref 0.1–1.0)
Monocytes Relative: 8 % (ref 3–12)
Neutrophils Relative %: 63 % (ref 43–77)

## 2012-01-20 NOTE — Patient Instructions (Signed)
20 ROYALE LENNARTZ  01/20/2012   Your procedure is scheduled on:   01/30/2012  Report to [facility] at  615  AM.  Call this number if you have problems the morning of surgery: [phone]   Remember:   Do not eat food:After Midnight.  May have clear liquids:until Midnight .  Clear liquids include soda, tea, black coffee, apple or grape juice, broth.  Take these medicines the morning of surgery with A SIP OF WATER:  none   Do not wear jewelry, make-up or nail polish.  Do not wear lotions, powders, or perfumes. You may wear deodorant.  Do not shave 48 hours prior to surgery.  Do not bring valuables to the hospital.  Contacts, dentures or bridgework may not be worn into surgery.  Leave suitcase in the car. After surgery it may be brought to your room.  For patients admitted to the hospital, checkout time is 11:00 AM the day of discharge.   Patients discharged the day of surgery will not be allowed to drive home.  Name and phone number of your driver: family Special Instructions: CHG Shower Use Special Wash: 1/2 bottle night before surgery and 1/2 bottle morning of surgery.   Please read over the following fact sheets that you were given: Pain Booklet, MRSA Information, Surgical Site Infection Prevention, Anesthesia Post-op Instructions and Care and Recovery After Surgery Hernia, Surgical Repair A hernia occurs when an internal organ pushes out through a weak spot in the belly (abdominal) wall muscles. Hernias commonly occur in the groin and around the navel. Hernias often can be pushed back into place (reduced). Most hernias tend to get worse over time. Problems occur when abdominal contents get stuck in the opening (incarcerated hernia). The blood supply gets cut off (strangulated hernia). This is an emergency and needs surgery. Otherwise, hernia repair can be an elective procedure. This means you can schedule this at your convenience when an emergency is not present. Because complications can  occur, if you decide to repair the hernia, it is best to do it soon. When it becomes an emergency procedure, there is increased risk of complications after surgery. CAUSES   Heavy lifting.   Obesity.   Prolonged coughing.   Straining to move your bowels.   Hernias can also occur through a cut (incision) by a surgeonafter an abdominal operation.  HOME CARE INSTRUCTIONS Before the repair:  Bed rest is not required. You may continue your normal activities, but avoid heavy lifting (more than 10 pounds) or straining. Cough gently. If you are a smoker, it is best to stop. Even the best hernia repair can break down with the continual strain of coughing.   Do not wear anything tight over your hernia. Do not try to keep it in with an outside bandage or truss. These can damage abdominal contents if they are trapped in the hernia sac.   Eat a normal diet. Avoid constipation. Straining over long periods of time to have a bowel movement will increase hernia size. It also can breakdown repairs. If you cannot do this with diet alone, laxatives or stool softeners may be used.  PRIOR TO SURGERY, SEEK IMMEDIATE MEDICAL CARE IF: You have problems (symptoms) of a trapped (incarcerated) hernia. Symptoms include:  An oral temperature above 102 F (38.9 C) develops, or as your caregiver suggests.   Increasing abdominal pain.   Feeling sick to your stomach(nausea) and vomiting.   You stop passing gas or stool.   The hernia is  stuck outside the abdomen, looks discolored, feels hard, or is tender.   You have any changes in your bowel habits or in the hernia that is unusual for you.  LET YOUR CAREGIVERS KNOW ABOUT THE FOLLOWING:  Allergies.   Medications taken including herbs, eye drops, over the counter medications, and creams.   Use of steroids (by mouth or creams).   Family or personal history of problems with anesthetics or Novocaine.   Possibility of pregnancy, if this applies.   Personal  history of blood clots (thrombophlebitis).   Family or personal history of bleeding or blood problems.   Previous surgery.   Other health problems.  BEFORE THE PROCEDURE You should be present 1 hour prior to your procedure, or as directed by your caregiver.  AFTER THE PROCEDURE After surgery, you will be taken to the recovery area. A nurse will watch and check your progress there. Once you are awake, stable, and taking fluids well, you will be allowed to go home as long as there are no problems. Once home, an ice pack (wrapped in a light towel) applied to your operative site may help with discomfort. It may also keep the swelling down. Do not lift anything heavier than 10 pounds (4.55 kilograms). Take showers not baths. Do not drive while taking narcotics. Follow instructions as suggested by your caregiver.  SEEK IMMEDIATE MEDICAL CARE IF: After surgery:  There is redness, swelling, or increasing pain in the wound.   There is pus coming from the wound.   There is drainage from a wound lasting longer than 1 day.   An unexplained oral temperature above 102 F (38.9 C) develops.   You notice a foul smell coming from the wound or dressing.   There is a breaking open of a wound (edged not staying together) after the sutures have been removed.   You notice increasing pain in the shoulders (shoulder strap areas).   You develop dizzy episodes or fainting while standing.   You develop persistent nausea or vomiting.   You develop a rash.   You have difficulty breathing.   You develop any reaction or side effects to medications given.  MAKE SURE YOU:   Understand these instructions.   Will watch your condition.   Will get help right away if you are not doing well or get worse.  Document Released: 03/22/2001 Document Revised: 09/15/2011 Document Reviewed: 02/12/2008 [facility] Patient Information 2012 [place], [place].PATIENT INSTRUCTIONS POST-ANESTHESIA  IMMEDIATELY FOLLOWING  SURGERY:  Do not drive or operate machinery for the first twenty four hours after surgery.  Do not make any important decisions for twenty four hours after surgery or while taking narcotic pain medications or sedatives.  If you develop intractable nausea and vomiting or a severe headache please notify your doctor immediately.  FOLLOW-UP:  Please make an appointment with your surgeon as instructed. You do not need to follow up with anesthesia unless specifically instructed to do so.  WOUND CARE INSTRUCTIONS (if applicable):  Keep a dry clean dressing on the anesthesia/puncture wound site if there is drainage.  Once the wound has quit draining you may leave it open to air.  Generally you should leave the bandage intact for twenty four hours unless there is drainage.  If the epidural site drains for more than 36-48 hours please call the anesthesia department.  QUESTIONS?:  Please feel free to call your physician or the hospital operator if you have any questions, and they will be happy to assist you.  [facility] Anesthesia Department [address] [place] [phone]

## 2012-01-22 NOTE — H&P (Signed)
  NTS SOAP Note  Vital Signs:  Vitals as of: 12/06/2011: Systolic 134: Diastolic 78: Heart Rate 78: Temp 96.35F: Height 10ft 0in: Weight 161Lbs 0 Ounces: OFC 0in: Respiratory Rate 0: O2 Saturation 0: Pain Level 0: BMI 31  BMI : 31.44 kg/m2  Subjective: This 41 Years 39 Months old Female presents forof Abdominal bulge at her bellybutton.Patient states she had presented to her OB/GYN for routine examination. At that time he had identified a possible umbilical hernia. She does state occasionally noting a bulge in this area with some discomfort. She denies any significant increase in size. No fevers or chills. No significant symptomatology consistent with incarceration or strangulation. Patient is otherwise healthy in discussion.  Review of Symptoms:  Constitutional:unremarkable Headaches Eyes:unremarkable Rhinorrhea Cardiovascular:unremarkable Respiratory:unremarkable Gastrointestinal:unremarkable Genitourinary:unremarkable Back pain Skin:unremarkable Breast:unremarkable Hematolgic/Lymphatic:unremarkable Allergic/Immunologic:unremarkable   Past Medical History:Obtained   Past Medical History  Pregnancy Gravida: 3 Pregnancy Para: 1 Surgical History: Cesarean Medical Problems: none Psychiatric History: none Allergies: no known drug allergies Medications: multivitamin   Social History:Obtained   Social History  Preferred Language: English ([place]) Race:  Black or African American Ethnicity: Not Hispanic / Latino Age: 41 Years 10 Months Marital Status:  S Alcohol: None Recreational drug(s): none   Smoking Status: Never smoker reviewed on 12/07/2011  Family History:Obtained   Family History  Is there a family history WU:[id number]    Objective Information: General:Well appearing, well nourished in no distress.Moderately obese Skin:no rash or prominent lesions Head:Atraumatic; no masses; no  abnormalities Eyes:conjunctiva clear, EOM intact, PERRL Mouth:Mucous membranes moist, no mucosal lesions. Throat:no erythema, exudates or lesions. Neck:Supple without lymphadenopathy.  Heart:RRR, no murmur Lungs:CTA bilaterally, no wheezes, rhonchi, rales.  Breathing unlabored. Abdomen:Soft, NT/ND, no HSM, no masses.Reducible small umbilical hernia. Extremities:No deformities, clubbing, cyanosis, or edema.   Assessment:  Diagnosis &amp; Procedure: DiagnosisCode: 553.1, ProcedureCode: 82956,    Plan: Umbilical hernia. Surgical options were discussed at length the patient. Patient will plan to proceed at her convenience. Signs and symptoms of incarceration and strangulation were discussed at length the patient. She understands presented to the emergency department should she develop any symptoms.  Patient Education:Alternative treatments to surgery were discussed with patient (and family).Risks and benefits  of procedure were fully explained to the patient (and family) who gave informed consent. Patient/family questions were addressed.  Follow-up:Pending Surgery                                     Active Diagnosis and Procedures: 553.1 Umbilical hernia without mention of obstruction or gangrene   21308 - OFFICE OUTPATIENT NEW 30 MINUTES

## 2012-01-30 ENCOUNTER — Ambulatory Visit (HOSPITAL_COMMUNITY): Payer: Medicaid Other | Admitting: Anesthesiology

## 2012-01-30 ENCOUNTER — Encounter (HOSPITAL_COMMUNITY): Payer: Self-pay | Admitting: Anesthesiology

## 2012-01-30 ENCOUNTER — Encounter (HOSPITAL_COMMUNITY)
Admission: RE | Disposition: A | Discharge status: Home / Self Care | Payer: Self-pay | Source: Ambulatory Visit | Attending: General Surgery

## 2012-01-30 ENCOUNTER — Ambulatory Visit (HOSPITAL_COMMUNITY)
Admission: RE | Admit: 2012-01-30 | Discharge: 2012-01-30 | Disposition: A | Discharge status: Home / Self Care | Payer: Medicaid Other | Source: Ambulatory Visit | Attending: General Surgery | Admitting: General Surgery

## 2012-01-30 ENCOUNTER — Encounter (HOSPITAL_COMMUNITY): Payer: Self-pay | Admitting: *Deleted

## 2012-01-30 DIAGNOSIS — Z01812 Encounter for preprocedural laboratory examination: Secondary | ICD-10-CM | POA: Insufficient documentation

## 2012-01-30 DIAGNOSIS — K429 Umbilical hernia without obstruction or gangrene: Secondary | ICD-10-CM | POA: Insufficient documentation

## 2012-01-30 HISTORY — PX: UMBILICAL HERNIA REPAIR: SHX196

## 2012-01-30 SURGERY — REPAIR, HERNIA, UMBILICAL, ADULT
Anesthesia: General | Wound class: Clean

## 2012-01-30 MED ORDER — FENTANYL CITRATE 0.05 MG/ML IJ SOLN
INTRAMUSCULAR | Status: AC
  Filled 2012-01-30: qty 2

## 2012-01-30 MED ORDER — FENTANYL CITRATE 0.05 MG/ML IJ SOLN
25.0000 ug | INTRAMUSCULAR | Status: DC | PRN
  Administered 2012-01-30 (×2): 50 ug via INTRAVENOUS

## 2012-01-30 MED ORDER — MIDAZOLAM HCL 2 MG/2ML IJ SOLN
INTRAMUSCULAR | Status: AC
  Administered 2012-01-30: 2 mg via INTRAVENOUS
  Filled 2012-01-30: qty 2

## 2012-01-30 MED ORDER — FENTANYL CITRATE 0.05 MG/ML IJ SOLN
INTRAMUSCULAR | Status: DC | PRN
  Administered 2012-01-30: 50 ug via INTRAVENOUS
  Administered 2012-01-30 (×2): 25 ug via INTRAVENOUS
  Administered 2012-01-30: 50 ug via INTRAVENOUS
  Administered 2012-01-30 (×2): 25 ug via INTRAVENOUS

## 2012-01-30 MED ORDER — SODIUM CHLORIDE 0.9 % IR SOLN
Status: DC | PRN
  Administered 2012-01-30: 1000 mL

## 2012-01-30 MED ORDER — MIDAZOLAM HCL 5 MG/5ML IJ SOLN
INTRAMUSCULAR | Status: DC | PRN
  Administered 2012-01-30: 2 mg via INTRAVENOUS

## 2012-01-30 MED ORDER — FENTANYL CITRATE 0.05 MG/ML IJ SOLN
INTRAMUSCULAR | Status: AC
  Filled 2012-01-30: qty 5

## 2012-01-30 MED ORDER — FENTANYL CITRATE 0.05 MG/ML IJ SOLN
INTRAMUSCULAR | Status: AC
  Administered 2012-01-30: 50 ug via INTRAVENOUS
  Filled 2012-01-30: qty 2

## 2012-01-30 MED ORDER — CEFAZOLIN SODIUM 1-5 GM-% IV SOLN
INTRAVENOUS | Status: DC | PRN
  Administered 2012-01-30: 1 g via INTRAVENOUS

## 2012-01-30 MED ORDER — BUPIVACAINE HCL (PF) 0.5 % IJ SOLN
INTRAMUSCULAR | Status: AC
  Filled 2012-01-30: qty 30

## 2012-01-30 MED ORDER — MIDAZOLAM HCL 2 MG/2ML IJ SOLN
1.0000 mg | INTRAMUSCULAR | Status: DC | PRN
  Administered 2012-01-30: 2 mg via INTRAVENOUS

## 2012-01-30 MED ORDER — ROCURONIUM BROMIDE 50 MG/5ML IV SOLN
INTRAVENOUS | Status: AC
  Filled 2012-01-30: qty 1

## 2012-01-30 MED ORDER — LACTATED RINGERS IV SOLN
INTRAVENOUS | Status: DC
  Administered 2012-01-30: 1000 mL via INTRAVENOUS

## 2012-01-30 MED ORDER — PROPOFOL 10 MG/ML IV EMUL
INTRAVENOUS | Status: AC
  Filled 2012-01-30: qty 20

## 2012-01-30 MED ORDER — HYDROCODONE-ACETAMINOPHEN 5-325 MG PO TABS: 1.0000 | ORAL_TABLET | ORAL | Status: AC | PRN

## 2012-01-30 MED ORDER — CHLORHEXIDINE GLUCONATE 4 % EX LIQD
1.0000 "application " | Freq: Once | CUTANEOUS | Status: DC
  Filled 2012-01-30: qty 15

## 2012-01-30 MED ORDER — CEFAZOLIN SODIUM 1-5 GM-% IV SOLN
INTRAVENOUS | Status: AC
  Filled 2012-01-30: qty 50

## 2012-01-30 MED ORDER — PROPOFOL 10 MG/ML IV BOLUS
INTRAVENOUS | Status: DC | PRN
  Administered 2012-01-30: 150 mg via INTRAVENOUS

## 2012-01-30 MED ORDER — ONDANSETRON HCL 4 MG/2ML IJ SOLN: 4.0000 mg | Freq: Once | INTRAMUSCULAR | Status: DC | PRN

## 2012-01-30 MED ORDER — CEFAZOLIN SODIUM 1-5 GM-% IV SOLN: 1.0000 g | INTRAVENOUS | Status: DC

## 2012-01-30 MED ORDER — MIDAZOLAM HCL 2 MG/2ML IJ SOLN
INTRAMUSCULAR | Status: AC
  Filled 2012-01-30: qty 2

## 2012-01-30 MED ORDER — BUPIVACAINE HCL (PF) 0.5 % IJ SOLN
INTRAMUSCULAR | Status: DC | PRN
  Administered 2012-01-30: 10 mL

## 2012-01-30 MED ORDER — LIDOCAINE HCL 1 % IJ SOLN
INTRAMUSCULAR | Status: DC | PRN
  Administered 2012-01-30: 50 mg via INTRADERMAL

## 2012-01-30 MED ORDER — LIDOCAINE HCL (PF) 1 % IJ SOLN
INTRAMUSCULAR | Status: AC
  Filled 2012-01-30: qty 5

## 2012-01-30 SURGICAL SUPPLY — 36 items
APL SKNCLS STERI-STRIP NONHPOA (GAUZE/BANDAGES/DRESSINGS) ×1
BAG HAMPER (MISCELLANEOUS) ×2 IMPLANT
BENZOIN TINCTURE PRP APPL 2/3 (GAUZE/BANDAGES/DRESSINGS) ×2 IMPLANT
CLOTH BEACON ORANGE TIMEOUT ST (SAFETY) ×2 IMPLANT
COVER SURGICAL LIGHT HANDLE (MISCELLANEOUS) ×4 IMPLANT
DECANTER SPIKE VIAL GLASS SM (MISCELLANEOUS) ×2 IMPLANT
DURAPREP 26ML APPLICATOR (WOUND CARE) ×2 IMPLANT
ELECT REM PT RETURN 9FT ADLT (ELECTROSURGICAL) ×2
ELECTRODE REM PT RTRN 9FT ADLT (ELECTROSURGICAL) ×1 IMPLANT
GLOVE BIOGEL PI IND STRL 7.0 (GLOVE) IMPLANT
GLOVE BIOGEL PI IND STRL 7.5 (GLOVE) ×1 IMPLANT
GLOVE BIOGEL PI INDICATOR 7.0 (GLOVE) ×2
GLOVE BIOGEL PI INDICATOR 7.5 (GLOVE) ×1
GLOVE ECLIPSE 6.5 STRL STRAW (GLOVE) ×2 IMPLANT
GLOVE ECLIPSE 7.0 STRL STRAW (GLOVE) ×1 IMPLANT
GOWN STRL REIN XL XLG (GOWN DISPOSABLE) ×6 IMPLANT
INST SET MINOR GENERAL (KITS) ×2 IMPLANT
KIT ROOM TURNOVER APOR (KITS) ×2 IMPLANT
MANIFOLD NEPTUNE II (INSTRUMENTS) ×2 IMPLANT
NDL HYPO 25X1 1.5 SAFETY (NEEDLE) ×1 IMPLANT
NEEDLE HYPO 25X1 1.5 SAFETY (NEEDLE) ×2 IMPLANT
NS IRRIG 1000ML POUR BTL (IV SOLUTION) ×2 IMPLANT
PACK MINOR (CUSTOM PROCEDURE TRAY) ×2 IMPLANT
PAD ARMBOARD 7.5X6 YLW CONV (MISCELLANEOUS) ×2 IMPLANT
PATCH VENTRAL SMALL 4.3 (Mesh Specialty) ×2 IMPLANT
SET BASIN LINEN APH (SET/KITS/TRAYS/PACK) ×2 IMPLANT
STRIP CLOSURE SKIN 1/2X4 (GAUZE/BANDAGES/DRESSINGS) ×2 IMPLANT
SUT ETHIBOND CT1 BRD 2-0 30IN (SUTURE) IMPLANT
SUT ETHIBOND NAB MO 7 #0 18IN (SUTURE) ×1 IMPLANT
SUT MNCRL AB 4-0 PS2 18 (SUTURE) ×2 IMPLANT
SUT VIC AB 2-0 CT2 27 (SUTURE) IMPLANT
SUT VIC AB 3-0 SH 27 (SUTURE) ×2
SUT VIC AB 3-0 SH 27X BRD (SUTURE) ×1 IMPLANT
SUT VICRYL AB 3 0 TIES (SUTURE) IMPLANT
SYR BULB IRRIGATION 50ML (SYRINGE) IMPLANT
SYR CONTROL 10ML LL (SYRINGE) ×2 IMPLANT

## 2012-01-30 NOTE — Interval H&P Note (Signed)
History and Physical Interval Note:  01/30/2012 7:43 AM  Donna [name]  has presented today for surgery, with the diagnosis of Umbilical hernia  The various methods of treatment have been discussed with the patient and family. After consideration of risks, benefits and other options for treatment, the patient has consented to  Procedure(s) (LRB): HERNIA REPAIR UMBILICAL ADULT (N/A) as a surgical intervention .  The patients' history has been reviewed, patient examined, no change in status, stable for surgery.  I have reviewed the patients' chart and labs.  Questions were answered to the patient's satisfaction.     [name]

## 2012-01-30 NOTE — Op Note (Signed)
Patient:  Donna [name]  DOB:  02/06/1971  MRN:  [id number]   Preop Diagnosis:  Umbilical hernia  Postop Diagnosis:  The same  Procedure:  Umbilical hernia repair with mesh  Surgeon:  Dr. Tilford Pillar  Anes:  General via laryngeal mask airway  Indications:  Patient is a 41 year old female presented to my office with a history of a abdominal wall just her bellybutton. Workup and evaluation was consistent with an umbilical hernia risks benefits alternatives of umbilical hernia repair with mesh were discussed at length the patient including but not limited to risk of bleeding, infection, infection and mesh requiring removal and subsequent repair, intraoperative cardiac and pulmonary events. Patient's questions and concerns addressed the patient was consented for the planned procedure.  Procedure note:  Patient is taken to the operating room was placed in the supine position on the OR table which time the general anesthesia was administered. At this point a larger mask airway placed by the nurse anesthetist. Patient's abdomen is prepped with DuraPrep solution and draped in standard fashion. An infraumbilical semilunar incision was created with a 15 blade scalpel with additional dissection down to subcuticular tissue carried out using electrocautery. This is carried out down to the fascia. The hemostat was then utilized to dissect around the umbilical stalk and hernia sac. Careful dissection is carried out to dissect the umbilical stalk off the hernia sac. During this dissection I did enter into the hernia sac and encountered a large amount of omental fat. I attempted to reduce his however being unable to completely reduce the omental fat I did excise some of the excess tissue and disposed of. Hemostasis is excellent and the remaining omental fat was teased back into the abdominal cavity. A medium ventral patch was brought to the field and was placed into the fascial defect. 0 Ethibond sutures were  utilized in simple or fashion to reapproximate the fascia laterally over the ventral plug. The mesh plug was secured to the fascia with several additional 0 Ethibond sutures. The repair was satisfactory and the wound was irrigated. Local anesthetic was instilled. A 3-0 Vicryl was then utilized to reapproximate the umbilical stalk to the fascia. A 3-0 Vicryl was then utilized to reapproximate the deep subcuticular tissue. A 4-0 Monocryl utilized reapproximate the skin edges in a running subcutaneous suture. The skin was washed dried moist dry towel. Benzoin is applied around incision. Half-inch are suture placed. The drapes removed the patient left come out of general anesthetic was transferred to the postanesthetic care unit in stable condition. At the conclusion of procedure all instrument, sponge, needle counts are correct. Patient tolerated procedure extremely well.  Complications:  None  EBL:  Minimal  Specimen:  None

## 2012-01-30 NOTE — Discharge Instructions (Signed)
Hernia  A hernia occurs when an internal organ pushes out through a weak spot in the abdominal wall. Hernias most commonly occur in the groin and around the navel. Hernias often can be pushed back into place (reduced). Most hernias tend to get worse over time. Some abdominal hernias can get stuck in the opening (irreducible or incarcerated hernia) and cannot be reduced. An irreducible abdominal hernia which is tightly squeezed into the opening is at risk for impaired blood supply (strangulated hernia). A strangulated hernia is a medical emergency. Because of the risk for an irreducible or strangulated hernia, surgery may be recommended to repair a hernia.  CAUSES    Heavy lifting.   Prolonged coughing.   Straining to have a bowel movement.   A cut (incision) made during an abdominal surgery.  HOME CARE INSTRUCTIONS    Bed rest is not required. You may continue your normal activities.   Avoid lifting more than 10 pounds (4.5 kg) or straining.   Cough gently. If you are a smoker it is best to stop. Even the best hernia repair can break down with the continual strain of coughing. Even if you do not have your hernia repaired, a cough will continue to aggravate the problem.   Do not wear anything tight over your hernia. Do not try to keep it in with an outside bandage or truss. These can damage abdominal contents if they are trapped within the hernia sac.   Eat a normal diet.   Avoid constipation. Straining over long periods of time will increase hernia size and encourage breakdown of repairs. If you cannot do this with diet alone, stool softeners may be used.  SEEK IMMEDIATE MEDICAL CARE IF:    You have a fever.   You develop increasing abdominal pain.   You feel nauseous or vomit.   Your hernia is stuck outside the abdomen, looks discolored, feels hard, or is tender.   You have any changes in your bowel habits or in the hernia that are unusual for you.   You have increased pain or swelling around the  hernia.   You cannot push the hernia back in place by applying gentle pressure while lying down.  MAKE SURE YOU:    Understand these instructions.   Will watch your condition.   Will get help right away if you are not doing well or get worse.  Document Released: 09/26/2005 Document Revised: 09/15/2011 Document Reviewed: 05/15/2008  [employer] Patient Information 2012 [employer].

## 2012-01-30 NOTE — Addendum Note (Signed)
Addendum  created 01/30/12 1610 by Despina Hidden, CRNA   Modules edited:Anesthesia Medication Administration

## 2012-01-30 NOTE — Addendum Note (Signed)
Addendum  created 01/30/12 0851 by Terryn Rosenkranz J Gema Ringold, CRNA   Modules edited:Anesthesia Medication Administration    

## 2012-01-30 NOTE — Anesthesia Preprocedure Evaluation (Signed)
Anesthesia Evaluation  Patient identified by MRN, date of birth, ID band Patient awake    Reviewed: Allergy & Precautions, H&P , NPO status , Patient's Chart, lab work & pertinent test results  History of Anesthesia Complications Negative for: history of anesthetic complications  Airway Mallampati: II      Dental  (+) Teeth Intact   Pulmonary neg pulmonary ROS,  breath sounds clear to auscultation        Cardiovascular negative cardio ROS  Rhythm:Regular Rate:Normal     Neuro/Psych    GI/Hepatic   Endo/Other    Renal/GU      Musculoskeletal   Abdominal   Peds  Hematology   Anesthesia Other Findings   Reproductive/Obstetrics                           Anesthesia Physical Anesthesia Plan  ASA: II  Anesthesia Plan: General   Post-op Pain Management:    Induction: Intravenous  Airway Management Planned: LMA  Additional Equipment:   Intra-op Plan:   Post-operative Plan: Extubation in OR  Informed Consent: I have reviewed the patients History and Physical, chart, labs and discussed the procedure including the risks, benefits and alternatives for the proposed anesthesia with the patient or authorized representative who has indicated his/her understanding and acceptance.     Plan Discussed with:   Anesthesia Plan Comments:         Anesthesia Quick Evaluation

## 2012-01-30 NOTE — Anesthesia Postprocedure Evaluation (Signed)
  Anesthesia Post-op Note  Patient: Donna [name]  Procedure(s) Performed: Procedure(s) (LRB): HERNIA REPAIR UMBILICAL ADULT (N/A)  Patient Location: PACU  Anesthesia Type: General  Level of Consciousness: awake, alert , oriented and patient cooperative  Airway and Oxygen Therapy: Patient Spontanous Breathing  Post-op Pain: 2 /10, mild  Post-op Assessment: Post-op Vital signs reviewed, Patient's Cardiovascular Status Stable, Respiratory Function Stable, Patent Airway, No signs of Nausea or vomiting and Pain level controlled  Post-op Vital Signs: Reviewed and stable  Complications: No apparent anesthesia complications

## 2012-01-30 NOTE — Anesthesia Procedure Notes (Signed)
Procedure Name: LMA Insertion Date/Time: 01/30/2012 7:55 AM Performed by: Despina Hidden Pre-anesthesia Checklist: Patient identified, Patient being monitored, Emergency Drugs available and Suction available Patient Re-evaluated:Patient Re-evaluated prior to inductionOxygen Delivery Method: Circle system utilized Preoxygenation: Pre-oxygenation with 100% oxygen Intubation Type: IV induction Ventilation: Mask ventilation without difficulty LMA Size: 3.0 Number of attempts: 1 Placement Confirmation: ETT inserted through vocal cords under direct vision,  positive ETCO2 and breath sounds checked- equal and bilateral Tube secured with: Tape Dental Injury: Teeth and Oropharynx as per pre-operative assessment

## 2012-01-30 NOTE — Transfer of Care (Signed)
Immediate Anesthesia Transfer of Care Note  Patient: Donna [name]  Procedure(s) Performed: Procedure(s) (LRB): HERNIA REPAIR UMBILICAL ADULT (N/A)  Patient Location: PACU  Anesthesia Type: General  Level of Consciousness: awake and patient cooperative  Airway & Oxygen Therapy: Patient Spontanous Breathing and Patient connected to face mask oxygen  Post-op Assessment: Report given to PACU RN, Post -op Vital signs reviewed and stable and Patient moving all extremities  Post vital signs: Reviewed and stable  Complications: No apparent anesthesia complications

## 2012-02-01 ENCOUNTER — Encounter (HOSPITAL_COMMUNITY): Payer: Self-pay | Admitting: General Surgery

## 2012-11-26 ENCOUNTER — Other Ambulatory Visit (HOSPITAL_COMMUNITY)
Admission: RE | Admit: 2012-11-26 | Discharge: 2012-11-26 | Disposition: A | Discharge status: Home / Self Care | Payer: Medicaid Other | Source: Ambulatory Visit | Attending: Obstetrics and Gynecology | Admitting: Obstetrics and Gynecology

## 2012-11-26 ENCOUNTER — Other Ambulatory Visit: Payer: Self-pay | Admitting: Adult Health

## 2012-11-26 DIAGNOSIS — Z1151 Encounter for screening for human papillomavirus (HPV): Secondary | ICD-10-CM | POA: Insufficient documentation

## 2012-11-26 DIAGNOSIS — Z01419 Encounter for gynecological examination (general) (routine) without abnormal findings: Secondary | ICD-10-CM | POA: Insufficient documentation

## 2013-12-24 ENCOUNTER — Other Ambulatory Visit: Payer: Self-pay | Admitting: Adult Health

## 2014-01-16 ENCOUNTER — Other Ambulatory Visit: Payer: Self-pay | Admitting: Adult Health

## 2014-01-24 ENCOUNTER — Other Ambulatory Visit: Payer: Self-pay | Admitting: Pediatrics

## 2014-01-24 MED ORDER — POLYMYXIN B-TRIMETHOPRIM 10000-0.1 UNIT/ML-% OP SOLN: 1.0000 [drp] | OPHTHALMIC | Status: AC

## 2014-01-24 NOTE — Progress Notes (Signed)
Sibling seen with pink eye. Mom states that pt has similar symptoms, so drops called in.

## 2014-02-19 ENCOUNTER — Other Ambulatory Visit: Payer: Self-pay | Admitting: Advanced Practice Midwife

## 2014-03-04 ENCOUNTER — Other Ambulatory Visit: Payer: Self-pay | Admitting: Advanced Practice Midwife

## 2014-03-04 ENCOUNTER — Encounter: Payer: Self-pay | Admitting: *Deleted

## 2014-04-21 ENCOUNTER — Encounter: Payer: Self-pay | Admitting: Adult Health

## 2014-04-21 ENCOUNTER — Ambulatory Visit (INDEPENDENT_AMBULATORY_CARE_PROVIDER_SITE_OTHER): Payer: Medicaid Other | Admitting: Adult Health

## 2014-04-21 VITALS — BP 120/80 | HR 78 | Ht 60.5 in | Wt 164.0 lb

## 2014-04-21 DIAGNOSIS — Z8619 Personal history of other infectious and parasitic diseases: Secondary | ICD-10-CM | POA: Insufficient documentation

## 2014-04-21 DIAGNOSIS — Z0000 Encounter for general adult medical examination without abnormal findings: Secondary | ICD-10-CM

## 2014-04-21 DIAGNOSIS — Z3041 Encounter for surveillance of contraceptive pills: Secondary | ICD-10-CM

## 2014-04-21 DIAGNOSIS — Z01419 Encounter for gynecological examination (general) (routine) without abnormal findings: Secondary | ICD-10-CM

## 2014-04-21 DIAGNOSIS — Z309 Encounter for contraceptive management, unspecified: Secondary | ICD-10-CM

## 2014-04-21 HISTORY — DX: Personal history of other infectious and parasitic diseases: Z86.19

## 2014-04-21 HISTORY — DX: Encounter for contraceptive management, unspecified: Z30.9

## 2014-04-21 MED ORDER — NORETHINDRONE 0.35 MG PO TABS: 1.0000 | ORAL_TABLET | Freq: Every day | ORAL | Status: DC

## 2014-04-21 MED ORDER — VALACYCLOVIR HCL 1 G PO TABS: 1000.0000 mg | ORAL_TABLET | Freq: Every day | ORAL | Status: DC

## 2014-04-21 NOTE — Patient Instructions (Signed)
Physical in  1 year Mammogram  Labs fasting in near future

## 2014-04-21 NOTE — Progress Notes (Signed)
Patient ID: Donna [name], female   DOB: 14-Sep-1971, 43 y.o.   MRN: [id number] History of Present Illness: Donna [name] is a 43 year old black female in for a physical. She had a normal pap with negative HPV 11/26/12.Complains of back itching at times.Is using Dove soap.Happy with her OCs.   Current Medications, Allergies, Past Medical History, Past Surgical History, Family History and Social History were reviewed in [employer] record.     Review of Systems: Patient denies any headaches, blurred vision, shortness of breath, chest pain, abdominal pain, problems with bowel movements, urination, or intercourse. No joint pain or mood swings.See HPI for positives.    Physical Exam:BP 120/80  Pulse 78  Ht 5' 0.5" (1.537 m)  Wt 164 lb (74.39 kg)  BMI 31.49 kg/m2  LMP 04/10/2014 General:  Well developed, well nourished, no acute distress Skin:  Warm and dry, no lesions on back Neck:  Midline trachea, normal thyroid Lungs; Clear to auscultation bilaterally Breast:  No dominant palpable mass, retraction, or nipple discharge Cardiovascular: Regular rate and rhythm Abdomen:  Soft, non tender, no hepatosplenomegaly Pelvic:  External genitalia is normal in appearance.  The vagina is normal in appearance.   The cervix is bulbous.  Uterus is felt to be normal size, shape, and contour.  No  adnexal masses or tenderness noted. Rectal: Good sphincter tone, no polyps, or hemorrhoids felt.  Hemoccult negative. Extremities:  No swelling or varicosities noted Psych:  No mood changes, alert and cooperative, seems happy   Impression: Yearly gyn exam no pap Contraceptive management History of herpes    Plan: Refilled micronor x 1 year Refilled valtrex x 1 year Physical in  1 year Mammogram yearly  Get fasting labs in near future, CBC,CMP,TSH and lipids

## 2014-05-21 ENCOUNTER — Encounter: Payer: Self-pay | Admitting: Obstetrics and Gynecology

## 2014-06-25 ENCOUNTER — Other Ambulatory Visit: Payer: Medicaid Other

## 2014-06-25 DIAGNOSIS — Z1329 Encounter for screening for other suspected endocrine disorder: Secondary | ICD-10-CM

## 2014-06-25 DIAGNOSIS — Z0000 Encounter for general adult medical examination without abnormal findings: Secondary | ICD-10-CM

## 2014-06-25 DIAGNOSIS — Z13220 Encounter for screening for lipoid disorders: Secondary | ICD-10-CM

## 2014-06-26 ENCOUNTER — Telehealth: Payer: Self-pay | Admitting: *Deleted

## 2014-06-26 LAB — LIPID PANEL
CHOL/HDL RATIO: 2.6 ratio
Cholesterol: 121 mg/dL (ref 0–200)
HDL: 46 mg/dL (ref >39)
LDL CALC: 58 mg/dL (ref 0–99)
TRIGLYCERIDES: 85 mg/dL (ref <150)
VLDL: 17 mg/dL (ref 0–40)

## 2014-06-26 LAB — COMPREHENSIVE METABOLIC PANEL
ALT: 19 U/L (ref 0–35)
AST: 19 U/L (ref 0–37)
Albumin: 3.6 g/dL (ref 3.5–5.2)
Alkaline Phosphatase: 58 U/L (ref 39–117)
BILIRUBIN TOTAL: 0.2 mg/dL (ref 0.2–1.2)
BUN: 9 mg/dL (ref 6–23)
CO2: 25 mEq/L (ref 19–32)
CREATININE: 0.87 mg/dL (ref 0.50–1.10)
Calcium: 8.5 mg/dL (ref 8.4–10.5)
Chloride: 106 mEq/L (ref 96–112)
Glucose, Bld: 90 mg/dL (ref 70–99)
Potassium: 4.1 mEq/L (ref 3.5–5.3)
Sodium: 137 mEq/L (ref 135–145)
Total Protein: 6.3 g/dL (ref 6.0–8.3)

## 2014-06-26 LAB — CBC
HEMATOCRIT: 37.3 % (ref 36.0–46.0)
Hemoglobin: 11.6 g/dL — ABNORMAL LOW (ref 12.0–15.0)
MCH: 25.5 pg — ABNORMAL LOW (ref 26.0–34.0)
MCHC: 31.1 g/dL (ref 30.0–36.0)
MCV: 82 fL (ref 78.0–100.0)
Platelets: 307 10*3/uL (ref 150–400)
RBC: 4.55 MIL/uL (ref 3.87–5.11)
RDW: 15.8 % — ABNORMAL HIGH (ref 11.5–15.5)
WBC: 7.4 10*3/uL (ref 4.0–10.5)

## 2014-06-26 LAB — TSH: TSH: 1.924 u[IU]/mL (ref 0.350–4.500)

## 2014-06-26 NOTE — Telephone Encounter (Signed)
Pt informed of WNL labs from 06/25/2014 per Derrek Monaco, NP.

## 2014-08-11 ENCOUNTER — Encounter: Payer: Self-pay | Admitting: Adult Health

## 2014-12-23 ENCOUNTER — Ambulatory Visit: Payer: Medicaid Other | Admitting: Adult Health

## 2014-12-30 ENCOUNTER — Ambulatory Visit (INDEPENDENT_AMBULATORY_CARE_PROVIDER_SITE_OTHER): Payer: Medicaid Other | Admitting: Adult Health

## 2014-12-30 ENCOUNTER — Encounter: Payer: Self-pay | Admitting: Adult Health

## 2014-12-30 VITALS — BP 138/80 | HR 84 | Ht 63.0 in | Wt 169.0 lb

## 2014-12-30 DIAGNOSIS — K429 Umbilical hernia without obstruction or gangrene: Secondary | ICD-10-CM | POA: Diagnosis not present

## 2014-12-30 DIAGNOSIS — Z91048 Other nonmedicinal substance allergy status: Secondary | ICD-10-CM | POA: Diagnosis not present

## 2014-12-30 DIAGNOSIS — Z9109 Other allergy status, other than to drugs and biological substances: Secondary | ICD-10-CM | POA: Insufficient documentation

## 2014-12-30 HISTORY — DX: Other allergy status, other than to drugs and biological substances: Z91.09

## 2014-12-30 HISTORY — DX: Umbilical hernia without obstruction or gangrene: K42.9

## 2014-12-30 MED ORDER — CETIRIZINE HCL 10 MG PO TABS
10.0000 mg | ORAL_TABLET | Freq: Every day | ORAL | Status: DC
Start: 2014-12-30 — End: 2015-11-18

## 2014-12-30 NOTE — Progress Notes (Signed)
Subjective:     Patient ID: Donna [name], female   DOB: [birth year], 44 y.o.   MRN: [id number]  HPI Donna [name] is a  44 year old black female in complaining of swelling to right of belly button and itchy skin and headaches and sneezing, fells better with allegra at times, she is working at [employer] and it is dusty.  Review of Systems +sneezing,+itchy skin,+bulge at navel  Reviewed past medical,surgical, social and family history. Reviewed medications and allergies.     Objective:   Physical Exam BP 138/80 mmHg  Pulse 84  Ht 5\' 3"  (1.6 m)  Wt 169 lb (76.658 kg)  BMI 29.94 kg/m2  LMP 03/07/2016Skin warm and dry, abdomen soft, non tender has small bulge to the right of navel,has had hernia repair in past.    Assessment:    Recurrent umbilical hernia Environmental allergies    Plan:     Take zyrtec 10 mg 1 daily, if not better will refer to Dr Ishmael Holter Will refer to Dr Arnoldo Morale to evaluate recurrent umbilical hernia Follow up prn

## 2014-12-30 NOTE — Patient Instructions (Signed)

## 2015-04-23 ENCOUNTER — Other Ambulatory Visit: Payer: Self-pay | Admitting: Adult Health

## 2015-05-05 ENCOUNTER — Encounter: Payer: Self-pay | Admitting: Adult Health

## 2015-05-05 ENCOUNTER — Ambulatory Visit (INDEPENDENT_AMBULATORY_CARE_PROVIDER_SITE_OTHER): Payer: Medicaid Other | Admitting: Adult Health

## 2015-05-05 VITALS — BP 116/80 | HR 76 | Ht 62.0 in | Wt 162.0 lb

## 2015-05-05 DIAGNOSIS — Z8619 Personal history of other infectious and parasitic diseases: Secondary | ICD-10-CM

## 2015-05-05 DIAGNOSIS — Z01419 Encounter for gynecological examination (general) (routine) without abnormal findings: Secondary | ICD-10-CM | POA: Diagnosis not present

## 2015-05-05 DIAGNOSIS — N898 Other specified noninflammatory disorders of vagina: Secondary | ICD-10-CM | POA: Insufficient documentation

## 2015-05-05 DIAGNOSIS — Z3041 Encounter for surveillance of contraceptive pills: Secondary | ICD-10-CM

## 2015-05-05 DIAGNOSIS — T192XXA Foreign body in vulva and vagina, initial encounter: Secondary | ICD-10-CM

## 2015-05-05 DIAGNOSIS — Z0000 Encounter for general adult medical examination without abnormal findings: Secondary | ICD-10-CM | POA: Diagnosis not present

## 2015-05-05 DIAGNOSIS — R300 Dysuria: Secondary | ICD-10-CM

## 2015-05-05 DIAGNOSIS — W448XXA Other foreign body entering into or through a natural orifice, initial encounter: Secondary | ICD-10-CM | POA: Insufficient documentation

## 2015-05-05 DIAGNOSIS — N926 Irregular menstruation, unspecified: Secondary | ICD-10-CM

## 2015-05-05 DIAGNOSIS — Z3202 Encounter for pregnancy test, result negative: Secondary | ICD-10-CM | POA: Diagnosis not present

## 2015-05-05 HISTORY — DX: Foreign body in vulva and vagina, initial encounter: T19.2XXA

## 2015-05-05 HISTORY — DX: Other specified noninflammatory disorders of vagina: N89.8

## 2015-05-05 HISTORY — DX: Irregular menstruation, unspecified: N92.6

## 2015-05-05 LAB — POCT URINE PREGNANCY: PREG TEST UR: NEGATIVE

## 2015-05-05 LAB — POCT URINALYSIS DIPSTICK
Glucose, UA: NEGATIVE
Ketones, UA: NEGATIVE
LEUKOCYTES UA: NEGATIVE
Nitrite, UA: NEGATIVE
Protein, UA: NEGATIVE

## 2015-05-05 MED ORDER — VALACYCLOVIR HCL 1 G PO TABS: 1000.0000 mg | ORAL_TABLET | Freq: Every day | ORAL | Status: DC

## 2015-05-05 MED ORDER — NORETHINDRONE 0.35 MG PO TABS: 1.0000 | ORAL_TABLET | Freq: Every day | ORAL | Status: DC

## 2015-05-05 MED ORDER — METRONIDAZOLE 0.75 % VA GEL: VAGINAL | Status: DC

## 2015-05-05 NOTE — Patient Instructions (Signed)
Pap and physical in 1 year Mammogram yearly Use metrogel  Use pad for now

## 2015-05-05 NOTE — Progress Notes (Signed)
Patient ID: Donna [name], female   DOB: 1971/10/02, 44 y.o.   MRN: [id number] History of Present Illness: Donna [name] is a 44 year old black female in for a well woman gyn exam and is complaining of vaginal discharge and irregular bleeding.Last pap 11/26/12 normal with negative HPV.She admits to having been late taking a pill or skipped one.And burns with urination at times, has been working long hours.   Current Medications, Allergies, Past Medical History, Past Surgical History, Family History and Social History were reviewed in [employer] record.     Review of Systems: Patient denies any headaches, hearing loss, fatigue, blurred vision, shortness of breath, chest pain, abdominal pain, problems with bowel movements, or intercourse. No joint pain or mood swings.See HPI for positives.    Physical Exam:BP 116/80 mmHg  Pulse 76  Ht 5\' 2"  (1.575 m)  Wt 162 lb (73.483 kg)  BMI 29.62 kg/m2  LMP 04/24/2015 (Exact Date)UPT negative, urine +blood General:  Well developed, well nourished, no acute distress Skin:  Warm and dry Neck:  Midline trachea, normal thyroid, good ROM, no lymphadenopathy Lungs; Clear to auscultation bilaterally Breast:  No dominant palpable mass, retraction, or nipple discharge Cardiovascular: Regular rate and rhythm Abdomen:  Soft, non tender, no hepatosplenomegaly Pelvic:  External genitalia is normal in appearance, no lesions.  The vagina is normal in appearance, except has retained tampon, which was removed with ring forceps. Urethra has no lesions or masses. The cervix is bulbous.  Uterus is felt to be normal size, shape, and contour.  No adnexal masses or tenderness noted.Bladder is non tender, no masses felt. Rectal: Good sphincter tone, no polyps, or hemorrhoids felt.  Hemoccult negative. Extremities/musculoskeletal:  No swelling or varicosities noted, no clubbing or cyanosis Psych:  No mood changes, alert and cooperative,seems  happy   Impression: Well woman gyn exam no pap Retained tampon  Burning with urination Irregular bleeding  Vaginal discharge Contraceptive management History of herpes    Plan: Rx metrogel use 1 applicator in vagina at bedtime for 5 nights with 1 refill Refilled micronor x 1 year Refilled valtex 1 gm 1 daily x 1 year Pap and physical in 1 year Mammogram yearly Use pads for now

## 2015-06-09 ENCOUNTER — Encounter: Payer: Self-pay | Admitting: Obstetrics and Gynecology

## 2015-09-07 ENCOUNTER — Telehealth: Payer: Self-pay | Admitting: Adult Health

## 2015-09-07 NOTE — Telephone Encounter (Signed)
Spoke with pt. Pt was having vaginal itching on Saturday. She tried Monistat and that seemed to have helped. She started bleeding today and just had a period the beginning of the month. Pt concerned. I advised she would need to be seen. Pt voiced understanding.Call transferred to front desk for appt. [place]

## 2015-09-08 ENCOUNTER — Encounter: Payer: Self-pay | Admitting: Women's Health

## 2015-09-08 ENCOUNTER — Ambulatory Visit (INDEPENDENT_AMBULATORY_CARE_PROVIDER_SITE_OTHER): Payer: Medicaid Other | Admitting: Women's Health

## 2015-09-08 VITALS — BP 118/70 | HR 80 | Wt 157.0 lb

## 2015-09-08 DIAGNOSIS — L292 Pruritus vulvae: Secondary | ICD-10-CM | POA: Diagnosis not present

## 2015-09-08 LAB — POCT WET PREP (WET MOUNT): CLUE CELLS WET PREP WHIFF POC: NEGATIVE

## 2015-09-08 NOTE — Progress Notes (Signed)
Patient ID: KANDA ELRICK, female   DOB: [birth year], 44 y.o.   MRN: [id number]   [facility] Visit  Patient name: Donna [name] MRN [id number]  Date of birth: Jan 10, 1971  CC & HPI:  Donna [name] is a 44 y.o. [phone] African American female presenting today for report of vulvar itching that began last week, used otc monistat 1 which helped, then couple days later itching returned, so used monistat 3 which again resolved itching, but then began having vaginal bleeding on 11/26. Her LMP was 11/2, states they are usually very regular w/ normal flow, so wasn't sure if this was period of something related to the itching. She stopped taking her birth control pills at the beginning of the month hoping to get pregnant. Not taking pnv. Doesn't smoke/drink/drugs, no chronic medical problems.  Patient's last menstrual period was 08/12/2015. The current method of family planning is none. Last pap 2014 neg w/ -HRHPV  Pertinent History Reviewed:  Medical & Surgical Hx:   Past Medical History  Diagnosis Date  . Herpes   . Contraceptive management 04/21/2014  . History of herpes simplex infection 04/21/2014  . Umbilical hernia 0000000  . Environmental allergies 12/30/2014  . Irregular bleeding 05/05/2015  . Vaginal discharge 05/05/2015  . Retained tampon 05/05/2015   Past Surgical History  Procedure Laterality Date  . Cervical cerclage  2010 and 2004    [employer] cerclage  . Cesarean section  2012    [facility]  . Umbilical hernia repair  01/30/2012    Procedure: HERNIA REPAIR UMBILICAL ADULT;  Surgeon: Donato Heinz, MD;  Location: [facility];  Service: General;  Laterality: N/A;  With mesh   Medications: Reviewed & Updated - see associated section Social History: Reviewed -  reports that she has never smoked. She has never used smokeless tobacco.  Objective Findings:  Vitals: BP 118/70 mmHg  Pulse 80  Wt 157 lb (71.215 kg)  LMP 08/12/2015 Body mass index is 28.71 kg/(m^2).  Physical  Examination: General appearance - alert, well appearing, and in no distress Pelvic - normal external genitalia, large amount menstrual colored blood in vault- cleared away w/ fox swab and 4x4, cx appears normal, slightly opened, trickle of menstrual colored blood coming from os, no CMT Bimanual: uterus normal size/contour, no tenderness, bilateral adnexa normal w/o tenderness  Results for orders placed or performed in visit on 09/08/15 (from the past 24 hour(s))  POCT Wet Prep [facility] [place])   Collection Time: 09/08/15 11:43 AM  Result Value Ref Range   Source Wet Prep POC vaginal    WBC, Wet Prep HPF POC none    Bacteria Wet Prep HPF POC None None, Few, Too numerous to count   BACTERIA WET PREP MORPHOLOGY POC     Clue Cells Wet Prep HPF POC None None, Too numerous to count   Clue Cells Wet Prep Whiff POC Negative Whiff    Yeast Wet Prep HPF POC None    KOH Wet Prep POC     Trichomonas Wet Prep HPF POC none     UPT: neg  Assessment & Plan:  A:   Slightly early menses  Resolved vaginitis  P:  If desires pregnancy, to begin pnv asap  Will also check gc/ct today  Return for July , Pap & physical.  Tawnya Crook CNM, [facility] 09/08/2015 12:34 PM

## 2015-09-09 ENCOUNTER — Ambulatory Visit: Payer: Medicaid Other | Admitting: Women's Health

## 2015-09-10 ENCOUNTER — Ambulatory Visit: Payer: Medicaid Other | Admitting: Adult Health

## 2015-09-10 LAB — GC/CHLAMYDIA PROBE AMP
Chlamydia trachomatis, NAA: NEGATIVE
NEISSERIA GONORRHOEAE BY PCR: NEGATIVE

## 2015-11-18 ENCOUNTER — Other Ambulatory Visit: Payer: Self-pay | Admitting: Adult Health

## 2016-05-05 ENCOUNTER — Ambulatory Visit (INDEPENDENT_AMBULATORY_CARE_PROVIDER_SITE_OTHER): Payer: Medicaid Other | Admitting: Adult Health

## 2016-05-05 ENCOUNTER — Other Ambulatory Visit (HOSPITAL_COMMUNITY)
Admission: RE | Admit: 2016-05-05 | Discharge: 2016-05-05 | Disposition: A | Discharge status: Home / Self Care | Payer: Medicaid Other | Source: Ambulatory Visit | Attending: Adult Health | Admitting: Adult Health

## 2016-05-05 ENCOUNTER — Encounter: Payer: Self-pay | Admitting: Adult Health

## 2016-05-05 VITALS — BP 120/80 | HR 72 | Ht 60.0 in | Wt 162.5 lb

## 2016-05-05 DIAGNOSIS — Z3009 Encounter for other general counseling and advice on contraception: Secondary | ICD-10-CM

## 2016-05-05 DIAGNOSIS — Z01419 Encounter for gynecological examination (general) (routine) without abnormal findings: Secondary | ICD-10-CM | POA: Insufficient documentation

## 2016-05-05 DIAGNOSIS — Z308 Encounter for other contraceptive management: Secondary | ICD-10-CM | POA: Diagnosis not present

## 2016-05-05 DIAGNOSIS — Z1151 Encounter for screening for human papillomavirus (HPV): Secondary | ICD-10-CM | POA: Insufficient documentation

## 2016-05-05 DIAGNOSIS — Z1211 Encounter for screening for malignant neoplasm of colon: Secondary | ICD-10-CM

## 2016-05-05 DIAGNOSIS — Z113 Encounter for screening for infections with a predominantly sexual mode of transmission: Secondary | ICD-10-CM | POA: Insufficient documentation

## 2016-05-05 DIAGNOSIS — Z862 Personal history of diseases of the blood and blood-forming organs and certain disorders involving the immune mechanism: Secondary | ICD-10-CM

## 2016-05-05 DIAGNOSIS — N898 Other specified noninflammatory disorders of vagina: Secondary | ICD-10-CM

## 2016-05-05 DIAGNOSIS — L298 Other pruritus: Secondary | ICD-10-CM

## 2016-05-05 DIAGNOSIS — Z3041 Encounter for surveillance of contraceptive pills: Secondary | ICD-10-CM

## 2016-05-05 HISTORY — DX: Personal history of diseases of the blood and blood-forming organs and certain disorders involving the immune mechanism: Z86.2

## 2016-05-05 HISTORY — DX: Other specified noninflammatory disorders of vagina: N89.8

## 2016-05-05 LAB — HEMOCCULT GUIAC POC 1CARD (OFFICE): FECAL OCCULT BLD: NEGATIVE

## 2016-05-05 LAB — POCT WET PREP (WET MOUNT): CLUE CELLS WET PREP WHIFF POC: NEGATIVE

## 2016-05-05 MED ORDER — VALACYCLOVIR HCL 1 G PO TABS: 1000.0000 mg | ORAL_TABLET | Freq: Every day | ORAL | 11 refills | Status: DC

## 2016-05-05 MED ORDER — NORETHINDRONE 0.35 MG PO TABS: 1.0000 | ORAL_TABLET | Freq: Every day | ORAL | 11 refills | Status: DC

## 2016-05-05 NOTE — Patient Instructions (Signed)
Physical in 1 year, pap in 3 if normal Mammogram yearly

## 2016-05-05 NOTE — Progress Notes (Addendum)
Patient ID: Donna [name], female   DOB: 1971-09-05, 45 y.o.   MRN: [id number] History of Present Illness: Gretchen is a 45 year old black female, in for a well woman gyn exam and pap,she is complaining of vaginal irritation,and itch and has used monistat with some relief, and was seen in ER and told was anemic. She is happy with micronor.  Current Medications, Allergies, Past Medical History, Past Surgical History, Family History and Social History were reviewed in [employer] record.     Review of Systems:  Patient denies any headaches, hearing loss, fatigue, blurred vision, shortness of breath, chest pain, abdominal pain, problems with bowel movements, urination, or intercourse. No joint pain or mood swings. See HPI for positives.  Physical Exam: General:  Well developed, well nourished, no acute distress Skin:  Warm and dry Neck:  Midline trachea, normal thyroid, good ROM, no lymphadenopathy Lungs; Clear to auscultation bilaterally Breast:  No dominant palpable mass, retraction, or nipple discharge Cardiovascular: Regular rate and rhythm Abdomen:  Soft, non tender, no hepatosplenomegaly Pelvic:  External genitalia is normal in appearance, no lesions.  The vagina is normal in appearance,white discharge without odor. Urethra has no lesions or masses. The cervix is bulbous. Pap with HPV,CG/CHL performed. Uterus is felt to be normal size, shape, and contour.  No adnexal masses or tenderness noted.Bladder is non tender, no masses felt.Wet prep:few WBC Rectal: Good sphincter tone, no polyps, or hemorrhoids felt.  Hemoccult negative. Extremities/musculoskeletal:  No swelling or varicosities noted, no clubbing or cyanosis Psych:  No mood changes, alert and cooperative,seems happy   Impression: Well woman gyn exam and pap Family planning  Itching in vaginal area Contraceptive management Hx anemia     Plan: Check CBC,HIV and RPR Refilled micronor x 1 year, take 1  daily Refilled valtrex 1 gm x 1 year take 1 daily  Physical in 1 year, pap in 3 if normal Mammogram yearly Will talk when labs back, sign up for my chart

## 2016-05-06 LAB — CBC
HEMATOCRIT: 37.3 % (ref 34.0–46.6)
Hemoglobin: 11.5 g/dL (ref 11.1–15.9)
MCH: 25.5 pg — ABNORMAL LOW (ref 26.6–33.0)
MCHC: 30.8 g/dL — AB (ref 31.5–35.7)
MCV: 83 fL (ref 79–97)
Platelets: 327 10*3/uL (ref 150–379)
RBC: 4.51 x10E6/uL (ref 3.77–5.28)
RDW: 18.1 % — AB (ref 12.3–15.4)
WBC: 8.2 10*3/uL (ref 3.4–10.8)

## 2016-05-06 LAB — HIV ANTIBODY (ROUTINE TESTING W REFLEX): HIV Screen 4th Generation wRfx: NONREACTIVE

## 2016-05-06 LAB — RPR: RPR Ser Ql: NONREACTIVE

## 2016-05-09 ENCOUNTER — Telehealth: Payer: Self-pay | Admitting: Adult Health

## 2016-05-09 LAB — CYTOLOGY - PAP

## 2016-05-09 NOTE — Telephone Encounter (Signed)
Left message I called, if she calls back let her know labs normal

## 2016-05-30 ENCOUNTER — Encounter: Payer: Self-pay | Admitting: Obstetrics and Gynecology

## 2017-05-27 ENCOUNTER — Other Ambulatory Visit: Payer: Self-pay | Admitting: Adult Health

## 2017-06-14 ENCOUNTER — Other Ambulatory Visit: Payer: Medicaid Other | Admitting: Adult Health

## 2017-06-27 ENCOUNTER — Telehealth: Payer: Self-pay | Admitting: Adult Health

## 2017-06-27 ENCOUNTER — Encounter: Payer: Self-pay | Admitting: Adult Health

## 2017-06-27 ENCOUNTER — Ambulatory Visit (INDEPENDENT_AMBULATORY_CARE_PROVIDER_SITE_OTHER): Payer: Medicaid Other | Admitting: Adult Health

## 2017-06-27 VITALS — BP 110/80 | HR 78 | Ht 60.0 in | Wt 167.0 lb

## 2017-06-27 DIAGNOSIS — Z1211 Encounter for screening for malignant neoplasm of colon: Secondary | ICD-10-CM

## 2017-06-27 DIAGNOSIS — Z113 Encounter for screening for infections with a predominantly sexual mode of transmission: Secondary | ICD-10-CM

## 2017-06-27 DIAGNOSIS — Z1212 Encounter for screening for malignant neoplasm of rectum: Secondary | ICD-10-CM | POA: Diagnosis not present

## 2017-06-27 DIAGNOSIS — Z01419 Encounter for gynecological examination (general) (routine) without abnormal findings: Secondary | ICD-10-CM | POA: Diagnosis not present

## 2017-06-27 DIAGNOSIS — Z0000 Encounter for general adult medical examination without abnormal findings: Secondary | ICD-10-CM | POA: Diagnosis not present

## 2017-06-27 LAB — HEMOCCULT GUIAC POC 1CARD (OFFICE): Fecal Occult Blood, POC: NEGATIVE

## 2017-06-27 MED ORDER — METRONIDAZOLE 0.75 % VA GEL: VAGINAL | 0 refills | Status: DC

## 2017-06-27 MED ORDER — VALACYCLOVIR HCL 1 G PO TABS: 1000.0000 mg | ORAL_TABLET | Freq: Every day | ORAL | 11 refills | Status: DC

## 2017-06-27 NOTE — Telephone Encounter (Signed)
Patient called stating that she just seen Donna [name] about an hour ago and she needs refill of her valtrex and metrogel. Pt states that she forgot to ask because she is completely out. Please contact pt

## 2017-06-27 NOTE — Progress Notes (Signed)
Patient ID: Donna [name], female   DOB: [birth year], 46 y.o.   MRN: [id number] History of Present Illness:  Donna [name] is a 46 year old black female in for well woman gyn exam, she had normal pap with negative HPV 05/05/16. PCP is Dr Moshe Cipro.   Current Medications, Allergies, Past Medical History, Past Surgical History, Family History and Social History were reviewed in [employer] record.     Review of Systems:  Patient denies any headaches, hearing loss, fatigue, blurred vision, shortness of breath, chest pain, abdominal pain, problems with bowel movements, urination, or intercourse. No joint pain or mood swings. Has ? About umbilical hernia.  Physical Exam:BP 110/80 (BP Location: Left Arm, Patient Position: Sitting, Cuff Size: Small)   Pulse 78   Ht 5' (1.524 m)   Wt 167 lb (75.8 kg)   LMP 06/12/2017   BMI 32.61 kg/m  General:  Well developed, well nourished, no acute distress Skin:  Warm and dry Neck:  Midline trachea, normal thyroid, good ROM, no lymphadenopathy Lungs; Clear to auscultation bilaterally Breast:  No dominant palpable mass, retraction, or nipple discharge Cardiovascular: Regular rate and rhythm Abdomen:  Soft, non tender, no hepatosplenomegaly Pelvic:  External genitalia is normal in appearance, no lesions.  The vagina is normal in appearance. Urethra has no lesions or masses. The cervix is bulbous.  Uterus is felt to be normal size, shape, and contour.  No adnexal masses or tenderness noted.Bladder is non tender, no masses felt.GC/CHL obtained.On rectal exam has good tone, no masses, hemoccult negative.  Extremities/musculoskeletal:  No swelling or varicosities noted, no clubbing or cyanosis Psych:  No mood changes, alert and cooperative,seems happy PHQ 2 score 0.  Impression: 1. Encounter for well woman exam   2. Screening for colorectal cancer   3. Screening examination for STD (sexually transmitted disease)       Plan: GC/CHL  sent Check HIV and RPR Physical in 1 year,pap in 2020

## 2017-06-27 NOTE — Telephone Encounter (Signed)
Will refill valtrex and metrogel.

## 2017-06-28 LAB — HIV ANTIBODY (ROUTINE TESTING W REFLEX): HIV Screen 4th Generation wRfx: NONREACTIVE

## 2017-06-28 LAB — RPR: RPR: NONREACTIVE

## 2017-06-29 DIAGNOSIS — Z1231 Encounter for screening mammogram for malignant neoplasm of breast: Secondary | ICD-10-CM | POA: Diagnosis not present

## 2017-06-29 LAB — GC/CHLAMYDIA PROBE AMP
Chlamydia trachomatis, NAA: NEGATIVE
NEISSERIA GONORRHOEAE BY PCR: NEGATIVE

## 2017-09-06 ENCOUNTER — Encounter: Payer: Self-pay | Admitting: Women's Health

## 2017-09-06 ENCOUNTER — Ambulatory Visit: Payer: Medicaid Other | Admitting: Women's Health

## 2017-09-06 VITALS — BP 130/80 | HR 92 | Ht 61.0 in | Wt 167.0 lb

## 2017-09-06 DIAGNOSIS — N926 Irregular menstruation, unspecified: Secondary | ICD-10-CM | POA: Diagnosis not present

## 2017-09-06 DIAGNOSIS — N852 Hypertrophy of uterus: Secondary | ICD-10-CM | POA: Diagnosis not present

## 2017-09-06 LAB — POCT WET PREP (WET MOUNT)
Clue Cells Wet Prep Whiff POC: NEGATIVE
Trichomonas Wet Prep HPF POC: ABSENT

## 2017-09-06 NOTE — Progress Notes (Signed)
   GYN VISIT Patient name: Donna [name] MRN [id number]  Date of birth: 1971-01-31 Chief Complaint:   gyn visit (irregular bleeding/ on 08-26-19 and back on 11-26 still on)  History of Present Illness:   Donna [name] is a 46 y.o. [id number] African American female being seen today for report of irregular heavy periods. States she is on micronor and has not missed/skipped any pills, periods are usually q 24 days and last for 7 days. However this month her period began a little early on 11/16 and lasted until 11/21. The started again 11/25 and is still on. And has been cramping the entire time- which is unusual for her. Also with small quarter-sized clots. 1st couple of days are heavy and changes tampon q 3-4hours, however when period restarted 2nd time, she bled through tampon as soon as she inserted it. This has not happened before.  Denies abnormal discharge, itching/odor/irritation.  Denies recent change in sex partners.   Patient's last menstrual period was 08/25/2017. The current method of family planning is oral progesterone-only contraceptive. Last pap 05/05/16. Results were:  normal Review of Systems:   Pertinent items are noted in HPI Denies fever/chills, dizziness, headaches, visual disturbances, fatigue, shortness of breath, chest pain, abdominal pain, vomiting, abnormal vaginal discharge/itching/odor/irritation, problems with periods, bowel movements, urination, or intercourse unless otherwise stated above.  Pertinent History Reviewed:  Reviewed past medical,surgical, social, obstetrical and family history.  Reviewed problem list, medications and allergies. Physical Assessment:   Vitals:   09/06/17 1614  BP: 130/80  Pulse: 92  Weight: 167 lb (75.8 kg)  Height: 5\' 1"  (1.549 m)  Body mass index is 31.55 kg/m.       Physical Examination:   General appearance: alert, well appearing, and in no distress  Mental status: alert, oriented to person, place, and time  Skin: warm & dry     Cardiovascular: normal heart rate noted  Respiratory: normal respiratory effort, no distress  Abdomen: soft, non-tender   Pelvic: VULVA: normal appearing vulva with no masses, tenderness or lesions, VAGINA: normal appearing vagina with normal color and discharge, no lesions, small amt non-odorous menstrual blood, CERVIX: normal appearing cervix without discharge or lesions, UTERUS: feels enlarged ~ 15-16wk size, ADNEXA: normal adnexa in size, nontender and no masses  Extremities: no edema   Results for orders placed or performed in visit on 09/06/17 (from the past 24 hour(s))  POCT Wet Prep [facility] [place])   Collection Time: 09/06/17  4:56 PM  Result Value Ref Range   Source Wet Prep POC vaginal    WBC, Wet Prep HPF POC none    Bacteria Wet Prep HPF POC None (A) Few   BACTERIA WET PREP MORPHOLOGY POC     Clue Cells Wet Prep HPF POC None None   Clue Cells Wet Prep Whiff POC Negative Whiff    Yeast Wet Prep HPF POC None    KOH Wet Prep POC     Trichomonas Wet Prep HPF POC Absent Absent    Assessment & Plan:  1) Irregular periods> normal wet prep, will send gc/ct  2) Enlarged uterus> will get pelvic u/s  Orders Placed This Encounter  Procedures  . GC/Chlamydia Probe Amp  . US PELVIS (TRANSABDOMINAL ONLY)  . US PELVIS TRANSVANGINAL NON-OB (TV ONLY)  . POCT Wet Prep [facility])    Return for 1st available pelvic u/s and f/u w/ me.  Tawnya Crook CNM, [facility] 09/06/2017 4:57 PM

## 2017-09-07 ENCOUNTER — Other Ambulatory Visit: Payer: Self-pay | Admitting: *Deleted

## 2017-09-07 ENCOUNTER — Other Ambulatory Visit: Payer: Self-pay | Admitting: Adult Health

## 2017-09-07 MED ORDER — CETIRIZINE HCL 10 MG PO TABS: 10.0000 mg | ORAL_TABLET | Freq: Every day | ORAL | 99 refills | Status: DC

## 2017-09-08 LAB — GC/CHLAMYDIA PROBE AMP
CHLAMYDIA, DNA PROBE: NEGATIVE
NEISSERIA GONORRHOEAE BY PCR: NEGATIVE

## 2017-09-08 MED ORDER — NORETHINDRONE 0.35 MG PO TABS: 1.0000 | ORAL_TABLET | Freq: Every day | ORAL | 11 refills | Status: DC

## 2017-09-11 ENCOUNTER — Other Ambulatory Visit: Payer: Self-pay

## 2017-09-11 ENCOUNTER — Encounter: Payer: Self-pay | Admitting: Women's Health

## 2017-09-11 ENCOUNTER — Ambulatory Visit: Payer: Medicaid Other | Admitting: Women's Health

## 2017-09-11 ENCOUNTER — Ambulatory Visit (INDEPENDENT_AMBULATORY_CARE_PROVIDER_SITE_OTHER): Payer: Medicaid Other

## 2017-09-11 VITALS — BP 120/84 | HR 76 | Ht 61.0 in | Wt 156.0 lb

## 2017-09-11 DIAGNOSIS — N921 Excessive and frequent menstruation with irregular cycle: Secondary | ICD-10-CM | POA: Diagnosis not present

## 2017-09-11 DIAGNOSIS — N926 Irregular menstruation, unspecified: Secondary | ICD-10-CM | POA: Diagnosis not present

## 2017-09-11 DIAGNOSIS — N852 Hypertrophy of uterus: Secondary | ICD-10-CM

## 2017-09-11 DIAGNOSIS — D259 Leiomyoma of uterus, unspecified: Secondary | ICD-10-CM | POA: Diagnosis not present

## 2017-09-11 NOTE — Progress Notes (Signed)
   GYN VISIT Patient name: Donna [name] MRN [id number]  Date of birth: October 24, 1970 Chief Complaint:   Follow-up (u/s today)  History of Present Illness:   Donna [name] is a 46 y.o. [id number] African American female being seen today for f/u after pelvic u/s for heavy irregular periods. Is on micronor and has not missed/skipped any pills, periods are usually q 24 days and last for 7 days. However in Nov her period began a little early on 11/16 and lasted until 11/21. The started again 11/25 and is still on. And has been cramping the entire time- which is unusual for her. Also with small quarter-sized clots. 1st couple of days are heavy and changes tampon q 3-4hours, however when period restarted 2nd time, she bled through tampon as soon as she inserted it. This has not happened before.  Denies abnormal discharge, itching/odor/irritation.  Denies recent change in sex partners. She was seen 09/06/17, and had neg wet prep and gc/ct. Uterus was enlarged on exam.   Patient's last menstrual period was 08/25/2017. The current method of family planning is oral progesterone-only contraceptive. Last pap 05/05/16. Results were:  neg w/ -HRHPV Review of Systems:   Pertinent items are noted in HPI Denies fever/chills, dizziness, headaches, visual disturbances, fatigue, shortness of breath, chest pain, abdominal pain, vomiting, abnormal vaginal discharge/itching/odor/irritation, problems with periods, bowel movements, urination, or intercourse unless otherwise stated above.  Pertinent History Reviewed:  Reviewed past medical,surgical, social, obstetrical and family history.  Reviewed problem list, medications and allergies. Physical Assessment:   Vitals:   09/11/17 1521  BP: 120/84  Pulse: 76  Weight: 156 lb (70.8 kg)  Height: 5\' 1"  (1.549 m)  Body mass index is 29.48 kg/m.       Physical Examination:   General appearance: alert, well appearing, and in no distress  Mental status: alert, oriented to  person, place, and time  Skin: warm & dry   Cardiovascular: normal heart rate noted  Respiratory: normal respiratory effort, no distress  Abdomen: soft, non-tender   Pelvic: examination not indicated  Extremities: no edema   PELVIC US TA/TV: heterogeneous anteverted uterus w/mult.fibroids,largest fibroids :(#1) posterior intramural fibroid 3.1 x 3.2 x 2.6 cm (#2) posterior right intramural 1.8 x 1.5 x 1.9 cm,EEC 7.6 mm,normal ovaries bilat,ovaries appear mobile,no free fluid,no pain during ultrasound   No results found for this or any previous visit (from the past 24 hour(s)).  Assessment & Plan:  1) Heavy irregular periods  2) Enlarged uterus w/ multiple fibroids on today's u/s> discussed w/ Dr. Elonda Husky, recommends she see one of them to discuss options. Gave printed info on fibroids.   No orders of the defined types were placed in this encounter.   Return for next week w/ Dr. Elonda Husky for visit.  Tawnya Crook CNM, [facility] 09/11/2017 4:21 PM

## 2017-09-11 NOTE — Patient Instructions (Signed)
Uterine Fibroids Uterine fibroids are tissue masses (tumors) that can develop in the womb (uterus). They are also called leiomyomas. This type of tumor is not cancerous (benign) and does not spread to other parts of the body outside of the pelvic area, which is between the hip bones. Occasionally, fibroids may develop in the fallopian tubes, in the cervix, or on the support structures (ligaments) that surround the uterus. You can have one or many fibroids. Fibroids can vary in size, weight, and where they grow in the uterus. Some can become quite large. Most fibroids do not require medical treatment. What are the causes? A fibroid can develop when a single uterine cell keeps growing (replicating). Most cells in the human body have a control mechanism that keeps them from replicating without control. What are the signs or symptoms? Symptoms may include:  Heavy bleeding during your period.  Bleeding or spotting between periods.  Pelvic pain and pressure.  Bladder problems, such as needing to urinate more often (urinary frequency) or urgently.  Inability to reproduce offspring (infertility).  Miscarriages.  How is this diagnosed? Uterine fibroids are diagnosed through a physical exam. Your health care provider may feel the lumpy tumors during a pelvic exam. Ultrasonography and an MRI may be done to determine the size, location, and number of fibroids. How is this treated? Treatment may include:  Watchful waiting. This involves getting the fibroid checked by your health care provider to see if it grows or shrinks. Follow your health care provider's recommendations for how often to have this checked.  Hormone medicines. These can be taken by mouth or given through an intrauterine device (IUD).  Surgery. ? Removing the fibroids (myomectomy) or the uterus (hysterectomy). ? Removing blood supply to the fibroids (uterine artery embolization).  If fibroids interfere with your fertility and you  want to become pregnant, your health care provider may recommend having the fibroids removed. Follow these instructions at home:  Keep all follow-up visits as directed by your health care provider. This is important.  Take over-the-counter and prescription medicines only as told by your health care provider. ? If you were prescribed a hormone treatment, take the hormone medicines exactly as directed.  Ask your health care provider about taking iron pills and increasing the amount of dark green, leafy vegetables in your diet. These actions can help to boost your blood iron levels, which may be affected by heavy menstrual bleeding.  Pay close attention to your period and tell your health care provider about any changes, such as: ? Increased blood flow that requires you to use more pads or tampons than usual per month. ? A change in the number of days that your period lasts per month. ? A change in symptoms that are associated with your period, such as abdominal cramping or back pain. Contact a health care provider if:  You have pelvic pain, back pain, or abdominal cramps that cannot be controlled with medicines.  You have an increase in bleeding between and during periods.  You soak tampons or pads in a half hour or less.  You feel lightheaded, extra tired, or weak. Get help right away if:  You faint.  You have a sudden increase in pelvic pain. This information is not intended to replace advice given to you by your health care provider. Make sure you discuss any questions you have with your health care provider. Document Released: 09/23/2000 Document Revised: 05/26/2016 Document Reviewed: 03/25/2014 Elsevier Interactive Patient Education  2018 [employer]  

## 2017-09-11 NOTE — Progress Notes (Addendum)
PELVIC US TA/TV: heterogeneous anteverted uterus w/mult.fibroids,largest fibroids :(#1) posterior intramural fibroid 3.1 x 3.2 x 2.6 cm (#2) posterior right intramural 1.8 x 1.5 x 1.9 cm,EEC 7.6 mm,normal ovaries bilat,ovaries appear mobile,no free fluid,no pain during ultrasound

## 2017-09-21 ENCOUNTER — Ambulatory Visit: Payer: Medicaid Other | Admitting: Obstetrics & Gynecology

## 2017-11-20 ENCOUNTER — Other Ambulatory Visit: Payer: Self-pay | Admitting: Women's Health

## 2018-11-26 ENCOUNTER — Other Ambulatory Visit: Payer: Self-pay | Admitting: Adult Health

## 2019-06-24 ENCOUNTER — Telehealth: Payer: Self-pay | Admitting: Internal Medicine

## 2019-06-24 NOTE — Telephone Encounter (Signed)
We have you scheduled for an upcoming appointment at our office. At this time, patients are encouraged to come alone to their visits whenever possible, however, a support person, over age 47, may accompany you to your appointment if assistance is needed for safety or care concerns. Otherwise, support persons should remain outside until the visit is complete.   We ask if you have had any exposure to anyone suspected or confirmed of having COVID-19 or if you are experiencing any of the following, to call and reschedule your appointment: fever, cough, shortness of breath, muscle pain, diarrhea, rash, vomiting, abdominal pain, red eye, weakness, bruising, bleeding, joint pain, or a severe headache.   Please know we will ask you these questions or similar questions when you arrive for your appointment and again its how we are keeping everyone safe.    Also,to keep you safe, please use the provided hand sanitizer when you enter the office. We are asking everyone in the office to wear a mask to help prevent the spread of germs. If you have a mask of your own, please wear it to your appointment, if not, we are happy to provide one for you.  Thank you for understanding and your cooperation.    [facility] Staff

## 2019-06-25 ENCOUNTER — Ambulatory Visit (INDEPENDENT_AMBULATORY_CARE_PROVIDER_SITE_OTHER): Payer: Self-pay | Admitting: Obstetrics and Gynecology

## 2019-06-25 ENCOUNTER — Encounter: Payer: Self-pay | Admitting: Obstetrics and Gynecology

## 2019-06-25 ENCOUNTER — Other Ambulatory Visit: Payer: Self-pay

## 2019-06-25 VITALS — BP 134/72 | HR 102 | Ht 61.0 in | Wt 152.8 lb

## 2019-06-25 DIAGNOSIS — D649 Anemia, unspecified: Secondary | ICD-10-CM

## 2019-06-25 LAB — POCT HEMOGLOBIN: Hemoglobin: 7.1 g/dL — AB (ref 11–14.6)

## 2019-06-25 NOTE — Progress Notes (Signed)
Patient ID: Donna [name], female   DOB: Jul 20, 1971, 48 y.o.   MRN: [id number]    [facility] Visit  @DATE @            Patient name: Donna [name] MRN [id number]  Date of birth: 1971-07-22  CC & HPI:  Donna [name] is a 48 y.o. female presenting today for. Says been taking iron supplements to get iron up. Last period was 06/18/2019 Wednesday night to Thursday morning with heavy bleeding with blood clots. On Friday noticed she was passing blood clots every time she went to void. Has had spotting since Saturday. Is used to having heavy periods with bleeding but this past period was abnormal with the amount of blood and clots. Is not bleeding at present time of appt.  ROS:  ROS +Umbilical hernia +uterine fibroids +anemia -fever -chills  Pertinent History Reviewed:   Reviewed: Significant for Medical         Past Medical History:  Diagnosis Date  . Anemia   . Contraceptive management 04/21/2014  . Environmental allergies 12/30/2014  . Herpes   . History of anemia 05/05/2016  . History of herpes simplex infection 04/21/2014  . Irregular bleeding 05/05/2015  . Itching in the vaginal area 05/05/2016  . Retained tampon 05/05/2015  . Umbilical hernia 0000000  . Vaginal discharge 05/05/2015                              Surgical Hx:    Past Surgical History:  Procedure Laterality Date  . CERVICAL CERCLAGE  2010 and 2004   [employer] cerclage  . [facility]AN SECTION  2012   [facility]  . UMBILICAL HERNIA REPAIR  01/30/2012   Procedure: HERNIA REPAIR UMBILICAL ADULT;  Surgeon: Donato Heinz, MD;  Location: [facility];  Service: General;  Laterality: N/A;  With mesh   Medications: Reviewed & Updated - see associated section                       Current Outpatient Medications:  .  cetirizine (ZYRTEC) 10 MG tablet, Take 1 tablet (10 mg total) by mouth daily., Disp: 30 tablet, Rfl: prn .  HEATHER 0.35 MG tablet, TAKE 1 TABLET BY MOUTH ONCE DAILY., Disp: 28 tablet, Rfl: 7 .  IRON PO,  Take by mouth daily., Disp: , Rfl:  .  Multiple Vitamin (MULTIVITAMIN) tablet, Take 1 tablet by mouth daily., Disp: , Rfl:  .  valACYclovir (VALTREX) 1000 MG tablet, TAKE (1) TABLET BY MOUTH ONCE DAILY., Disp: 30 tablet, Rfl: 0   Social History: Reviewed -  reports that she has never smoked. She has never used smokeless tobacco.  Objective Findings:  Vitals: Blood pressure 134/72, pulse (!) 102, height 5\' 1"  (1.549 m), weight 152 lb 12.8 oz (69.3 kg), last menstrual period 06/19/2019.  PHYSICAL EXAMINATION General appearance - alert, well appearing, and in no distress Mental status - alert, oriented to person, place, and time, normal mood, behavior, speech, dress, motor activity, and thought processes, affect appropriate to mood Abdomen -  3 cm area of fullness to the para-umbilicus, some tenderness when coughing due to hernia. Has had hernial repair.  PELVIC Vagina - small blood clots 1 cm in size  Cervix - appears normal Uterus - palpated to normal size, retroverted  Assessment & Plan:   A:  1. Para-umbilical hernia 2. fibroids  P:  1.  anemia panel 2. Continuous  ocp  3. F/u 3 mo or prn    By signing my name below, I, Donna [name], attest that this documentation has been prepared under the direction and in the presence of Jonnie Kind, MD. Electronically Signed: [place]. 06/25/19. 3:11 PM.  I personally performed the services described in this documentation, which was SCRIBED in my presence. The recorded information has been reviewed and considered accurate. It has been edited as necessary during review. Jonnie Kind, MD

## 2019-06-26 ENCOUNTER — Telehealth: Payer: Self-pay | Admitting: *Deleted

## 2019-06-26 LAB — CBC
Hematocrit: 24.5 % — ABNORMAL LOW (ref 34.0–46.6)
Hemoglobin: 6.7 g/dL — CL (ref 11.1–15.9)
MCH: 18.7 pg — ABNORMAL LOW (ref 26.6–33.0)
MCHC: 27.3 g/dL — ABNORMAL LOW (ref 31.5–35.7)
MCV: 68 fL — ABNORMAL LOW (ref 79–97)
Platelets: 351 10*3/uL (ref 150–450)
RBC: 3.59 x10E6/uL — ABNORMAL LOW (ref 3.77–5.28)
RDW: 18.8 % — ABNORMAL HIGH (ref 11.7–15.4)
WBC: 6 10*3/uL (ref 3.4–10.8)

## 2019-06-26 LAB — IRON AND TIBC
Iron Saturation: 10 % — ABNORMAL LOW (ref 15–55)
Iron: 37 ug/dL (ref 27–159)
Total Iron Binding Capacity: 389 ug/dL (ref 250–450)
UIBC: 352 ug/dL (ref 131–425)

## 2019-06-26 LAB — FERRITIN: Ferritin: 15 ng/mL (ref 15–150)

## 2019-06-26 LAB — FOLATE: Folate: 12.8 ng/mL (ref >3.0)

## 2019-06-26 LAB — VITAMIN B12: Vitamin B-12: 1265 pg/mL — ABNORMAL HIGH (ref 232–1245)

## 2019-06-26 NOTE — Telephone Encounter (Signed)
Pt says that she needs a note because she didn't go to work today due to her anemia. States that the dr she saw yesterday told her to call for a note if she needs it.

## 2019-06-26 NOTE — Telephone Encounter (Signed)
Work note given for 06/26/19. Pt to come by and pick up. [place]

## 2019-08-06 ENCOUNTER — Ambulatory Visit: Payer: Medicaid Other | Admitting: Adult Health

## 2019-08-08 ENCOUNTER — Telehealth: Payer: Self-pay | Admitting: Adult Health

## 2019-08-08 NOTE — Telephone Encounter (Signed)
Tried to reach the patient to remind her of her appointment/restrictions, call can not be completed at this time. °

## 2019-08-09 ENCOUNTER — Ambulatory Visit: Payer: Medicaid Other | Admitting: Adult Health

## 2019-09-23 ENCOUNTER — Telehealth: Payer: Self-pay | Admitting: Advanced Practice Midwife

## 2019-09-23 NOTE — Telephone Encounter (Signed)

## 2019-09-25 ENCOUNTER — Ambulatory Visit: Payer: Medicaid Other | Admitting: Obstetrics and Gynecology

## 2019-09-25 ENCOUNTER — Ambulatory Visit: Payer: Medicaid Other | Admitting: Adult Health

## 2019-10-01 ENCOUNTER — Ambulatory Visit: Payer: Medicaid Other | Admitting: Adult Health

## 2019-10-08 ENCOUNTER — Telehealth: Payer: Self-pay | Admitting: Adult Health

## 2019-10-08 NOTE — Telephone Encounter (Signed)
Called patient regarding appointment and the following message was left:   We have you scheduled for an upcoming appointment at our office. At this time, we are still not allowing visitors or children during the appointment, however, a support person, over age 47, may accompany you to your appointment if assistance is needed for safety or care concerns. Otherwise, support persons should remain outside until the visit is complete.   We ask if you have had any exposure to anyone suspected or confirmed of having COVID-19, are awaiting test results for COVID-19 or if you are experiencing any of the following, to call and reschedule your appointment: fever, cough, shortness of breath, muscle pain, diarrhea, rash, vomiting, abdominal pain, red eye, weakness, bruising, bleeding, joint pain, or a severe headache.   Please know we will ask you these questions or similar questions when you arrive for your appointment and again it's how we are keeping everyone safe.    Also,to keep you safe, please use the provided hand sanitizer when you enter the office. We are asking everyone in the office to wear a mask to help prevent the spread of germs. If you have a mask of your own, please wear it to your appointment, if not, we are happy to provide one for you.  Thank you for understanding and your cooperation.    [facility] Staff      

## 2019-10-09 ENCOUNTER — Encounter: Payer: Self-pay | Admitting: Adult Health

## 2019-10-09 ENCOUNTER — Other Ambulatory Visit: Payer: Self-pay

## 2019-10-09 ENCOUNTER — Other Ambulatory Visit (HOSPITAL_COMMUNITY)
Admission: RE | Admit: 2019-10-09 | Discharge: 2019-10-09 | Disposition: A | Discharge status: Home / Self Care | Payer: Medicaid Other | Source: Ambulatory Visit | Attending: Obstetrics and Gynecology | Admitting: Obstetrics and Gynecology

## 2019-10-09 ENCOUNTER — Ambulatory Visit: Payer: Self-pay | Admitting: Adult Health

## 2019-10-09 VITALS — BP 138/93 | HR 74 | Ht 61.0 in | Wt 153.0 lb

## 2019-10-09 DIAGNOSIS — D500 Iron deficiency anemia secondary to blood loss (chronic): Secondary | ICD-10-CM | POA: Insufficient documentation

## 2019-10-09 DIAGNOSIS — N898 Other specified noninflammatory disorders of vagina: Secondary | ICD-10-CM

## 2019-10-09 DIAGNOSIS — Z113 Encounter for screening for infections with a predominantly sexual mode of transmission: Secondary | ICD-10-CM | POA: Insufficient documentation

## 2019-10-09 DIAGNOSIS — N939 Abnormal uterine and vaginal bleeding, unspecified: Secondary | ICD-10-CM

## 2019-10-09 DIAGNOSIS — Z3202 Encounter for pregnancy test, result negative: Secondary | ICD-10-CM

## 2019-10-09 DIAGNOSIS — D259 Leiomyoma of uterus, unspecified: Secondary | ICD-10-CM

## 2019-10-09 DIAGNOSIS — K429 Umbilical hernia without obstruction or gangrene: Secondary | ICD-10-CM

## 2019-10-09 DIAGNOSIS — R030 Elevated blood-pressure reading, without diagnosis of hypertension: Secondary | ICD-10-CM | POA: Insufficient documentation

## 2019-10-09 LAB — POCT HEMOGLOBIN: Hemoglobin: 11 g/dL (ref 11–14.6)

## 2019-10-09 LAB — POCT URINE PREGNANCY: Preg Test, Ur: NEGATIVE

## 2019-10-09 MED ORDER — VALACYCLOVIR HCL 1 G PO TABS: ORAL_TABLET | ORAL | 12 refills | Status: DC

## 2019-10-09 NOTE — Progress Notes (Signed)
Patient ID: Donna [name], female   DOB: [birth year], 48 y.o.   MRN: [id number] History of Present Illness: Donna [name] is a 48 year old black female, [id number] in complaining of vaginal bleeding at times and vaginal odor. She had had heavy bleeding and was placed on Donna [name] by Dr Glo Herring in September, but she has missed some pills lately.She has history of fibroids. She has Umbilical hernia that causes pain at times too, has had repaired in the past. PCP is Dr Gaspar Garbe.   Current Medications, Allergies, Past Medical History, Past Surgical History, Family History and Social History were reviewed in [employer] record.     Review of Systems: Patient denies any headaches, dizziness, fatigue, blurred vision, shortness of breath, chest pain, abdominal pain, problems with bowel movements, urination, or intercourse. No joint pain or mood swings. See HPI for positives.   Physical Exam:BP (!) 138/93 (BP Location: Left Arm, Patient Position: Sitting, Cuff Size: Normal)   Pulse 74   Ht 5\' 1"  (1.549 m)   Wt 153 lb (69.4 kg)   LMP 10/02/2019 (Exact Date)   BMI 28.91 kg/m   HGB 11 UPT is negative. Fall risk is low PHQ 2 score is 0. General:  Well developed, well nourished, no acute distress Skin:  Warm and dry Lungs; Clear to auscultation bilaterally Cardiovascular: Regular rate and rhythm Abdomen:  Soft, non tender, no hepatosplenomegaly, has 4 cm protrusion to right of umbilicus that is tender Pelvic:  External genitalia is normal in appearance, no lesions.  The vagina is normal in appearance. Urethra has no lesions or masses. The cervix is bulbous.  Uterus is felt to be about 14 week size.  No adnexal masses or tenderness noted.Bladder is non tender, no masses felt. CV swab obtained.  Extremities/musculoskeletal:  No swelling or varicosities noted, no clubbing or cyanosis Psych:  No mood changes, alert and cooperative,seems happy Examination chaperoned by Donna Infante  LPN.  Impression and Plan:  1. Vaginal bleeding Start taking Donna [name] regularly Will get GYN [place]   2. Vaginal odor CV swab sent   3. Uterine leiomyoma, unspecified location Get GYN [place] in 2 weeks   4. Umbilical hernia without obstruction and without gangrene Referred to Dr Constance Haw to assess   5. Screening examination for STD (sexually transmitted disease) CV swab sent for GC/CHL and trich   6. Elevated BP without diagnosis of hypertension Recheck BP in 2 weeks Decrease salt and sugar Review handout on DASH diet  7. Iron deficiency anemia due to chronic blood loss Continue iron   Refilled valtrex at her request.  Meds ordered this encounter  Medications  . valACYclovir (VALTREX) 1000 MG tablet    Sig: TAKE (1) TABLET BY MOUTH ONCE DAILY.    Dispense:  30 tablet    Refill:  12    Order Specific Question:   Supervising Provider    Answer:   Tania Ade H [[id number]]

## 2019-10-09 NOTE — Patient Instructions (Signed)

## 2019-10-15 ENCOUNTER — Telehealth: Payer: Self-pay | Admitting: Adult Health

## 2019-10-15 DIAGNOSIS — A599 Trichomoniasis, unspecified: Secondary | ICD-10-CM | POA: Insufficient documentation

## 2019-10-15 LAB — CERVICOVAGINAL ANCILLARY ONLY
Chlamydia: NEGATIVE
Comment: NEGATIVE
Comment: NEGATIVE
Comment: NORMAL
Neisseria Gonorrhea: NEGATIVE
Trichomonas: POSITIVE — AB

## 2019-10-15 MED ORDER — METRONIDAZOLE 500 MG PO TABS: 500.0000 mg | ORAL_TABLET | Freq: Two times a day (BID) | ORAL | 0 refills | Status: DC

## 2019-10-15 NOTE — Telephone Encounter (Signed)
Left message that CV swab negative GC/chlamydia but +trich, will rx flagyl for pt and no sex and can do POC self swab when gets [place] around 1/13. Partner needs to be treated too, can see PCP or clinic.

## 2019-10-21 ENCOUNTER — Telehealth: Payer: Self-pay | Admitting: Obstetrics & Gynecology

## 2019-10-21 NOTE — Telephone Encounter (Signed)
Tried to reach the patient to remind her of her appointment/restrictions, no answer, no voicemail.

## 2019-10-23 ENCOUNTER — Other Ambulatory Visit: Payer: Medicaid Other

## 2019-10-24 ENCOUNTER — Ambulatory Visit: Payer: Medicaid Other | Admitting: General Surgery

## 2019-11-07 ENCOUNTER — Ambulatory Visit: Payer: Medicaid Other | Admitting: General Surgery

## 2020-01-14 ENCOUNTER — Emergency Department (HOSPITAL_COMMUNITY)
Admission: EM | Admit: 2020-01-14 | Discharge: 2020-01-14 | Disposition: A | Discharge status: Home / Self Care | Payer: BC Managed Care – PPO | Attending: Emergency Medicine | Admitting: Emergency Medicine

## 2020-01-14 ENCOUNTER — Encounter (HOSPITAL_COMMUNITY): Payer: Self-pay

## 2020-01-14 ENCOUNTER — Other Ambulatory Visit: Payer: Self-pay

## 2020-01-14 DIAGNOSIS — J0111 Acute recurrent frontal sinusitis: Secondary | ICD-10-CM | POA: Diagnosis not present

## 2020-01-14 DIAGNOSIS — R519 Headache, unspecified: Secondary | ICD-10-CM | POA: Diagnosis present

## 2020-01-14 DIAGNOSIS — Z79899 Other long term (current) drug therapy: Secondary | ICD-10-CM | POA: Insufficient documentation

## 2020-01-14 MED ORDER — LORATADINE 10 MG PO TABS
10.0000 mg | ORAL_TABLET | Freq: Every day | ORAL | Status: DC
  Administered 2020-01-14: 10 mg via ORAL
  Filled 2020-01-14: qty 1

## 2020-01-14 MED ORDER — PSEUDOEPHEDRINE HCL 60 MG PO TABS
60.0000 mg | ORAL_TABLET | Freq: Once | ORAL | Status: AC
  Administered 2020-01-14: 60 mg via ORAL
  Filled 2020-01-14: qty 1

## 2020-01-14 MED ORDER — CETIRIZINE-PSEUDOEPHEDRINE ER 5-120 MG PO TB12: 1.0000 | ORAL_TABLET | Freq: Every day | ORAL | 0 refills | Status: AC

## 2020-01-14 NOTE — ED Triage Notes (Signed)
Pt presents to ED with complaints of headache, and sinus pressure since yesterday. Pt denies fever, ST.

## 2020-01-15 NOTE — ED Provider Notes (Signed)
[facility] EMERGENCY DEPARTMENT Provider Note   CSN: [id number] Arrival date & time: 01/14/20  2120     History Chief Complaint  Patient presents with  . Headache    Donna [name] is a 49 y.o. female.   Headache Pain location:  Frontal Quality:  Dull Radiates to:  Does not radiate Severity currently:  5/10 Severity at highest:  5/10 Duration:  2 days Timing:  Intermittent Chronicity:  Recurrent Similar to prior headaches: yes   Relieved by:  None tried Worsened by:  Nothing Ineffective treatments:  NSAIDs Associated symptoms: cough and URI   Associated symptoms: no abdominal pain, no facial pain, no fatigue and no fever        Past Medical History:  Diagnosis Date  . Anemia   . Contraceptive management 04/21/2014  . Environmental allergies 12/30/2014  . Herpes   . History of anemia 05/05/2016  . History of herpes simplex infection 04/21/2014  . Irregular bleeding 05/05/2015  . Itching in the vaginal area 05/05/2016  . Retained tampon 05/05/2015  . Umbilical hernia 0000000  . Vaginal discharge 05/05/2015    Patient Active Problem List   Diagnosis Date Noted  . Trichimoniasis 10/15/2019  . Vaginal odor 10/09/2019  . Vaginal bleeding 10/09/2019  . Screening examination for STD (sexually transmitted disease) 10/09/2019  . Elevated BP without diagnosis of hypertension 10/09/2019  . Iron deficiency anemia due to chronic blood loss 10/09/2019  . Uterine fibroids 09/11/2017  . Screening for colorectal cancer 06/27/2017  . History of anemia 05/05/2016  . Irregular bleeding 05/05/2015  . Retained tampon 05/05/2015  . Umbilical hernia [id number]  . Environmental allergies 12/30/2014  . History of herpes simplex infection 04/21/2014    Past Surgical History:  Procedure Laterality Date  . CERVICAL CERCLAGE  2010 and 2004   [employer] cerclage  . [facility]AN SECTION  2012   [facility]  . UMBILICAL HERNIA REPAIR  01/30/2012   Procedure: HERNIA REPAIR UMBILICAL ADULT;   Surgeon: Donato Heinz, MD;  Location: [facility];  Service: General;  Laterality: N/A;  With mesh     OB History    Gravida  5   Para  3   Term      Preterm      AB  2   Living  3     SAB  2   TAB      Ectopic      Multiple      Live Births              Family History  Problem Relation Age of Onset  . Diabetes Mother   . Hypertension Mother   . Diabetes Father   . Hypertension Father   . Asthma Daughter   . Cancer Maternal Grandfather        prostate  . Asthma Daughter   . Anesthesia problems Neg Hx   . Hypotension Neg Hx   . Malignant hyperthermia Neg Hx   . Pseudochol deficiency Neg Hx     Social History   Tobacco Use  . Smoking status: Never Smoker  . Smokeless tobacco: Never Used  Substance Use Topics  . Alcohol use: No  . Drug use: No    Home Medications Prior to Admission medications   Medication Sig Start Date End Date Taking? Authorizing Provider  aspirin-sod bicarb-citric acid (ALKA-SELTZER) 325 MG TBEF tablet Take 325 mg by mouth once as needed (for headache).   Yes [provider]  cetirizine (ZYRTEC) 10  MG tablet Take 1 tablet (10 mg total) by mouth daily. Patient taking differently: Take 10 mg by mouth daily as needed for allergies.  09/07/17  Yes Derrek Monaco A, NP  ferrous sulfate 325 (65 FE) MG EC tablet Take 325 mg by mouth daily.   Yes [provider]  fluticasone (FLONASE) 50 MCG/ACT nasal spray Place 2 sprays into both nostrils daily.   Yes [provider]  guaiFENesin (MUCINEX) 600 MG 12 hr tablet Take 600 mg by mouth once as needed for to loosen phlegm.   Yes [provider]  HEATHER 0.35 MG tablet TAKE 1 TABLET BY MOUTH ONCE DAILY. Patient taking differently: Take 1 tablet by mouth daily.  11/23/17  Yes Roma Schanz, CNM  Multiple Vitamin (MULTIVITAMIN) tablet Take 1 tablet by mouth daily.   Yes [provider]  valACYclovir (VALTREX) 1000 MG tablet TAKE (1) TABLET BY  MOUTH ONCE DAILY. Patient taking differently: Take 500 mg by mouth daily as needed (for outbreak).  10/09/19  Yes Derrek Monaco A, NP  cetirizine-pseudoephedrine (ZYRTEC-D) 5-120 MG tablet Take 1 tablet by mouth daily for 7 days. 01/14/20 01/21/20  Asherah Lavoy, Corene Cornea, MD    Allergies    Sulfa antibiotics  Review of Systems   Review of Systems  Constitutional: Negative for fatigue and fever.  Respiratory: Positive for cough.   Gastrointestinal: Negative for abdominal pain.  Neurological: Positive for headaches.  All other systems reviewed and are negative.   Physical Exam Updated Vital Signs BP (!) 138/100 (BP Location: Right Arm)   Pulse 83   Temp 97.6 F (36.4 C) (Oral)   Resp 16   Ht 5\' 1"  (1.549 m)   Wt 65.8 kg   LMP 01/10/2020   SpO2 100%   BMI 27.40 kg/m   Physical Exam Vitals and nursing note reviewed.  Constitutional:      Appearance: She is well-developed.  HENT:     Head: Normocephalic and atraumatic.     Nose: Congestion present. No rhinorrhea.     Mouth/Throat:     Mouth: Mucous membranes are moist.  Eyes:     Extraocular Movements: Extraocular movements intact.     Conjunctiva/sclera: Conjunctivae normal.     Pupils: Pupils are equal, round, and reactive to light.  Cardiovascular:     Rate and Rhythm: Normal rate and regular rhythm.  Pulmonary:     Effort: Respiratory distress present.     Breath sounds: No stridor.  Abdominal:     General: There is no distension.  Musculoskeletal:     Cervical back: Normal range of motion.  Skin:    General: Skin is warm and dry.     Coloration: Skin is not jaundiced or pale.  Neurological:     General: No focal deficit present.     Mental Status: She is alert.     Comments: No altered mental status, able to give full seemingly accurate history.  Face is symmetric, EOM's intact, pupils equal and reactive, vision intact, tongue and uvula midline without deviation. Upper and Lower extremity motor 5/5, intact pain  perception in distal extremities, 2+ reflexes in biceps, patella and achilles tendons. Able to perform finger to nose normal with both hands. Walks without assistance or evident ataxia.      ED Results / Procedures / Treatments   Labs (all labs ordered are listed, but only abnormal results are displayed) Labs Reviewed - No data to display  EKG None  Radiology No results found.  Procedures  Procedures (including critical care time)  Medications Ordered in ED Medications  loratadine (CLARITIN) tablet 10 mg (10 mg Oral Given 01/14/20 2341)  pseudoephedrine (SUDAFED) tablet 60 mg (60 mg Oral Given 01/14/20 2341)    ED Course  I have reviewed the triage vital signs and the nursing notes.  Pertinent labs & imaging results that were available during my care of the patient were reviewed by me and considered in my medical decision making (see chart for details).    MDM Rules/Calculators/A&P                      Likely sinusitis. No red flags to indicate imaging. Plan for symptomatic/supportive care at home. PCP follow up for symptoms longer than a week. No indication for antibiotics.   Final Clinical Impression(s) / ED Diagnoses Final diagnoses:  Acute recurrent frontal sinusitis    Rx / DC Orders ED Discharge Orders         Ordered    cetirizine-pseudoephedrine (ZYRTEC-D) 5-120 MG tablet  Daily     01/14/20 2328           Brittni Hult, Corene Cornea, MD 01/15/20 [phone]

## 2020-01-17 ENCOUNTER — Encounter (HOSPITAL_COMMUNITY): Payer: Self-pay

## 2020-01-17 ENCOUNTER — Emergency Department (HOSPITAL_COMMUNITY)
Admission: EM | Admit: 2020-01-17 | Discharge: 2020-01-17 | Disposition: A | Discharge status: Home / Self Care | Payer: BC Managed Care – PPO | Attending: Emergency Medicine | Admitting: Emergency Medicine

## 2020-01-17 ENCOUNTER — Other Ambulatory Visit: Payer: Self-pay

## 2020-01-17 DIAGNOSIS — R519 Headache, unspecified: Secondary | ICD-10-CM | POA: Diagnosis not present

## 2020-01-17 MED ORDER — DIPHENHYDRAMINE HCL 25 MG PO CAPS
25.0000 mg | ORAL_CAPSULE | Freq: Once | ORAL | Status: AC
  Administered 2020-01-17: 13:00:00 25 mg via ORAL
  Filled 2020-01-17: qty 1

## 2020-01-17 MED ORDER — NAPROXEN 250 MG PO TABS
500.0000 mg | ORAL_TABLET | Freq: Once | ORAL | Status: AC
  Administered 2020-01-17: 13:00:00 500 mg via ORAL
  Filled 2020-01-17: qty 2

## 2020-01-17 MED ORDER — DEXAMETHASONE SODIUM PHOSPHATE 10 MG/ML IJ SOLN
10.0000 mg | Freq: Once | INTRAMUSCULAR | Status: AC
  Administered 2020-01-17: 13:00:00 10 mg via INTRAMUSCULAR
  Filled 2020-01-17: qty 1

## 2020-01-17 MED ORDER — METOCLOPRAMIDE HCL 10 MG PO TABS
10.0000 mg | ORAL_TABLET | Freq: Once | ORAL | Status: AC
  Administered 2020-01-17: 10 mg via ORAL
  Filled 2020-01-17: qty 1

## 2020-01-17 NOTE — ED Provider Notes (Signed)
[facility] EMERGENCY DEPARTMENT Provider Note   CSN: [id number] Arrival date & time: 01/17/20  1040     History Chief Complaint  Patient presents with  . Headache    Donna [name] is a 49 y.o. female.  Donna [name] is a 49 y.o. female with history of seasonal allergies, anemia, who presents to the emergency department for evaluation of recurrent headaches.  She states for the past 2 weeks she has been getting frequent headaches, they are often located over her right temple and are described as a constant dull throbbing ache.  She reports if she takes things like Excedrin Migraine these headaches will go away but they keep recurring on an almost daily basis.  She was initially seen for the same 2 days ago and this was felt to likely be related to seasonal allergies or sinusitis.  She was started on Zyrtec and Sudafed which she says she has been taking intermittently, but has not been taking on a daily basis, she also states she has been using Flonase intermittently.  She states that she feels like her sinus congestion is starting to improve little bit but she is continuing to have headaches so wanted to get checked out again.  She denies any associated fevers or chills.  No cough or sore throat.  No neck pain or stiffness.  She states yesterday and today with her headache she started to have some light sensitivity but no vision changes.  Was mildly nauseated today with no vomiting.  Is concerned she could have migraines.  No associated numbness tingling or weakness in her extremities.  No chest pain or shortness of breath.  No abdominal pain.  Current headache is located over her right temple.        Past Medical History:  Diagnosis Date  . Anemia   . Contraceptive management 04/21/2014  . Environmental allergies 12/30/2014  . Herpes   . History of anemia 05/05/2016  . History of herpes simplex infection 04/21/2014  . Irregular bleeding 05/05/2015  . Itching in the vaginal area  05/05/2016  . Retained tampon 05/05/2015  . Umbilical hernia 0000000  . Vaginal discharge 05/05/2015    Patient Active Problem List   Diagnosis Date Noted  . Trichimoniasis 10/15/2019  . Vaginal odor 10/09/2019  . Vaginal bleeding 10/09/2019  . Screening examination for STD (sexually transmitted disease) 10/09/2019  . Elevated BP without diagnosis of hypertension 10/09/2019  . Iron deficiency anemia due to chronic blood loss 10/09/2019  . Uterine fibroids 09/11/2017  . Screening for colorectal cancer 06/27/2017  . History of anemia 05/05/2016  . Irregular bleeding 05/05/2015  . Retained tampon 05/05/2015  . Umbilical hernia [id number]  . Environmental allergies 12/30/2014  . History of herpes simplex infection 04/21/2014    Past Surgical History:  Procedure Laterality Date  . CERVICAL CERCLAGE  2010 and 2004   [employer] cerclage  . C[facility]AN SECTION  2012   [facility]  . UMBILICAL HERNIA REPAIR  01/30/2012   Procedure: HERNIA REPAIR UMBILICAL ADULT;  Surgeon: Donato Heinz, MD;  Location: [facility];  Service: General;  Laterality: N/A;  With mesh     OB History    Gravida  5   Para  3   Term      Preterm      AB  2   Living  3     SAB  2   TAB      Ectopic      Multiple  Live Births              Family History  Problem Relation Age of Onset  . Diabetes Mother   . Hypertension Mother   . Diabetes Father   . Hypertension Father   . Asthma Daughter   . Cancer Maternal Grandfather        prostate  . Asthma Daughter   . Anesthesia problems Neg Hx   . Hypotension Neg Hx   . Malignant hyperthermia Neg Hx   . Pseudochol deficiency Neg Hx     Social History   Tobacco Use  . Smoking status: Never Smoker  . Smokeless tobacco: Never Used  Substance Use Topics  . Alcohol use: No  . Drug use: No    Home Medications Prior to Admission medications   Medication Sig Start Date End Date Taking? Authorizing Provider  aspirin-sod bicarb-citric acid  (ALKA-SELTZER) 325 MG TBEF tablet Take 325 mg by mouth once as needed (for headache).    [provider]  cetirizine (ZYRTEC) 10 MG tablet Take 1 tablet (10 mg total) by mouth daily. Patient taking differently: Take 10 mg by mouth daily as needed for allergies.  09/07/17   Estill Dooms, NP  cetirizine-pseudoephedrine (ZYRTEC-D) 5-120 MG tablet Take 1 tablet by mouth daily for 7 days. 01/14/20 01/21/20  Mesner, Corene Cornea, MD  ferrous sulfate 325 (65 FE) MG EC tablet Take 325 mg by mouth daily.    [provider]  fluticasone (FLONASE) 50 MCG/ACT nasal spray Place 2 sprays into both nostrils daily.    [provider]  guaiFENesin (MUCINEX) 600 MG 12 hr tablet Take 600 mg by mouth once as needed for to loosen phlegm.    [provider]  HEATHER 0.35 MG tablet TAKE 1 TABLET BY MOUTH ONCE DAILY. Patient taking differently: Take 1 tablet by mouth daily.  11/23/17   Roma Schanz, CNM  Multiple Vitamin (MULTIVITAMIN) tablet Take 1 tablet by mouth daily.    [provider]  valACYclovir (VALTREX) 1000 MG tablet TAKE (1) TABLET BY MOUTH ONCE DAILY. Patient taking differently: Take 500 mg by mouth daily as needed (for outbreak).  10/09/19   Estill Dooms, NP    Allergies    Sulfa antibiotics  Review of Systems   Review of Systems  Constitutional: Negative for chills and fever.  HENT: Positive for congestion and rhinorrhea. Negative for ear pain, sinus pressure and sinus pain.   Eyes: Positive for photophobia. Negative for visual disturbance.  Respiratory: Negative for cough and shortness of breath.   Cardiovascular: Negative for chest pain.  Gastrointestinal: Positive for nausea. Negative for abdominal pain, constipation, diarrhea and vomiting.  Genitourinary: Negative for dysuria and frequency.  Musculoskeletal: Negative for arthralgias, myalgias, neck pain and neck stiffness.  Skin: Negative for color change and rash.  Neurological: Positive  for headaches.    Physical Exam Updated Vital Signs BP (!) 147/95 (BP Location: Right Arm)   Pulse 85   Temp 98.2 F (36.8 C) (Oral)   Resp 16   Ht 5\' 1"  (1.549 m)   Wt 65 kg   LMP 01/10/2020   SpO2 100%   BMI 27.08 kg/m   Physical Exam Vitals and nursing note reviewed.  Constitutional:      General: She is not in acute distress.    Appearance: Normal appearance. She is well-developed. She is not ill-appearing or diaphoretic.  HENT:     Head: Normocephalic and atraumatic.     Nose: Congestion  present.     Comments: Nasal congestion noted, no tenderness to palpation over the sinuses    Mouth/Throat:     Mouth: Mucous membranes are moist.     Pharynx: Oropharynx is clear.  Eyes:     General:        Right eye: No discharge.        Left eye: No discharge.     Extraocular Movements: Extraocular movements intact.     Pupils: Pupils are equal, round, and reactive to light.  Neck:     Meningeal: Kernig's sign absent.     Comments: No rigidity, full range of motion in all directions Cardiovascular:     Rate and Rhythm: Normal rate and regular rhythm.     Heart sounds: Normal heart sounds.  Pulmonary:     Effort: Pulmonary effort is normal. No respiratory distress.     Breath sounds: Normal breath sounds. No wheezing or rales.  Abdominal:     General: Bowel sounds are normal. There is no distension.     Palpations: Abdomen is soft. There is no mass.     Tenderness: There is no abdominal tenderness. There is no guarding.  Musculoskeletal:        General: No deformity.     Cervical back: Neck supple. No rigidity.  Skin:    General: Skin is warm and dry.     Capillary Refill: Capillary refill takes less than 2 seconds.  Neurological:     Mental Status: She is alert.     GCS: GCS eye subscore is 4. GCS verbal subscore is 5. GCS motor subscore is 6.     Coordination: Coordination normal.     Comments: Speech is clear, able to follow commands CN III-XII intact Normal  strength in upper and lower extremities bilaterally including dorsiflexion and plantar flexion, strong and equal grip strength Sensation normal to light and sharp touch Moves extremities without ataxia, coordination intact  Psychiatric:        Mood and Affect: Mood normal.        Behavior: Behavior normal.     ED Results / Procedures / Treatments   Labs (all labs ordered are listed, but only abnormal results are displayed) Labs Reviewed - No data to display  EKG None  Radiology No results found.  Procedures Procedures (including critical care time)  Medications Ordered in ED Medications  naproxen (NAPROSYN) tablet 500 mg (500 mg Oral Given 01/17/20 1250)  metoCLOPramide (REGLAN) tablet 10 mg (10 mg Oral Given 01/17/20 1257)  diphenhydrAMINE (BENADRYL) capsule 25 mg (25 mg Oral Given 01/17/20 1250)  dexamethasone (DECADRON) injection 10 mg (10 mg Intramuscular Given 01/17/20 1250)    ED Course  I have reviewed the triage vital signs and the nursing notes.  Pertinent labs & imaging results that were available during my care of the patient were reviewed by me and considered in my medical decision making (see chart for details).    MDM Rules/Calculators/A&P                      Pt HA treated and improved while in ED.  Presentation is like pts typical HA,suspect it is related to sinuses versus migraine. Non concerning for [facility], ICH, Meningitis, or temporal arteritis. Pt is afebrile with no focal neuro deficits, nuchal rigidity, or change in vision.  Had long discussion with patient about appropriate medication management for headaches as well as consistent medication for seasonal allergies.  Pt is to follow  up with PCP to discuss prophylactic medication. Pt verbalizes understanding and is agreeable with plan to dc.   Final Clinical Impression(s) / ED Diagnoses Final diagnoses:  Acute nonintractable headache, unspecified headache type    Rx / DC Orders ED Discharge Orders    None        Janet Berlin 01/17/20 1322    Milton Ferguson, MD 01/17/20 1349

## 2020-01-17 NOTE — ED Triage Notes (Signed)
C/o headache onset seen this week for same, states it is continuing

## 2020-01-17 NOTE — Discharge Instructions (Signed)
Take Zyrtec and Flonase daily for your seasonal allergies, it is important that you use these medications consistently for them to be most effective.  If you have return headaches you can use Excedrin Migraine combined with Tylenol.  Please follow-up with your primary care doctor regarding these headaches and allergy symptoms.

## 2020-04-03 ENCOUNTER — Other Ambulatory Visit: Payer: Self-pay | Admitting: Adult Health

## 2020-05-12 ENCOUNTER — Other Ambulatory Visit: Payer: Self-pay

## 2020-05-12 ENCOUNTER — Encounter (HOSPITAL_COMMUNITY): Payer: Self-pay | Admitting: Emergency Medicine

## 2020-05-12 ENCOUNTER — Emergency Department (HOSPITAL_COMMUNITY)
Admission: EM | Admit: 2020-05-12 | Discharge: 2020-05-12 | Disposition: A | Discharge status: Home / Self Care | Payer: BC Managed Care – PPO | Attending: Emergency Medicine | Admitting: Emergency Medicine

## 2020-05-12 ENCOUNTER — Emergency Department (HOSPITAL_COMMUNITY): Payer: BC Managed Care – PPO

## 2020-05-12 DIAGNOSIS — R030 Elevated blood-pressure reading, without diagnosis of hypertension: Secondary | ICD-10-CM | POA: Insufficient documentation

## 2020-05-12 DIAGNOSIS — Z23 Encounter for immunization: Secondary | ICD-10-CM | POA: Diagnosis not present

## 2020-05-12 DIAGNOSIS — Z7982 Long term (current) use of aspirin: Secondary | ICD-10-CM | POA: Diagnosis not present

## 2020-05-12 DIAGNOSIS — Z79899 Other long term (current) drug therapy: Secondary | ICD-10-CM | POA: Insufficient documentation

## 2020-05-12 DIAGNOSIS — W25XXXA Contact with sharp glass, initial encounter: Secondary | ICD-10-CM | POA: Diagnosis not present

## 2020-05-12 DIAGNOSIS — S61210A Laceration without foreign body of right index finger without damage to nail, initial encounter: Secondary | ICD-10-CM | POA: Diagnosis not present

## 2020-05-12 DIAGNOSIS — Y9289 Other specified places as the place of occurrence of the external cause: Secondary | ICD-10-CM | POA: Diagnosis not present

## 2020-05-12 DIAGNOSIS — Y9389 Activity, other specified: Secondary | ICD-10-CM | POA: Insufficient documentation

## 2020-05-12 DIAGNOSIS — Y990 Civilian activity done for income or pay: Secondary | ICD-10-CM | POA: Diagnosis not present

## 2020-05-12 DIAGNOSIS — S6991XA Unspecified injury of right wrist, hand and finger(s), initial encounter: Secondary | ICD-10-CM | POA: Diagnosis present

## 2020-05-12 MED ORDER — HYDROCODONE-ACETAMINOPHEN 5-325 MG PO TABS
1.0000 | ORAL_TABLET | Freq: Once | ORAL | Status: AC
  Administered 2020-05-12: 1 via ORAL
  Filled 2020-05-12: qty 1

## 2020-05-12 MED ORDER — TETANUS-DIPHTH-ACELL PERTUSSIS 5-2.5-18.5 LF-MCG/0.5 IM SUSP
0.5000 mL | Freq: Once | INTRAMUSCULAR | Status: AC
  Administered 2020-05-12: 0.5 mL via INTRAMUSCULAR
  Filled 2020-05-12: qty 0.5

## 2020-05-12 MED ORDER — DOUBLE ANTIBIOTIC 500-10000 UNIT/GM EX OINT: TOPICAL_OINTMENT | Freq: Once | CUTANEOUS | Status: DC

## 2020-05-12 MED ORDER — LIDOCAINE HCL (PF) 1 % IJ SOLN
10.0000 mL | Freq: Once | INTRAMUSCULAR | Status: DC
  Filled 2020-05-12: qty 30

## 2020-05-12 MED ORDER — BACITRACIN ZINC 500 UNIT/GM EX OINT
TOPICAL_OINTMENT | Freq: Once | CUTANEOUS | Status: DC
  Filled 2020-05-12: qty 0.9

## 2020-05-12 NOTE — ED Notes (Signed)
Patient transported to X-ray 

## 2020-05-12 NOTE — ED Provider Notes (Signed)
[facility] EMERGENCY DEPARTMENT Provider Note   CSN: [id number] Arrival date & time: 05/12/20  2109     History Chief Complaint  Patient presents with  . Laceration    right index finger    Donna [name] is a 49 y.o. female who presents for evaluation of laceration to right index finger that occurred 20 minutes prior to ED arrival.  She reports that she was trying to put something in the sink and thinks that she hit it on a broken glass.  She states that she does not know when her last tetanus shot was.  She is not on blood thinners.  She denies any numbness/weakness.  The history is provided by the patient.       Past Medical History:  Diagnosis Date  . Anemia   . Contraceptive management 04/21/2014  . Environmental allergies 12/30/2014  . Herpes   . History of anemia 05/05/2016  . History of herpes simplex infection 04/21/2014  . Irregular bleeding 05/05/2015  . Itching in the vaginal area 05/05/2016  . Retained tampon 05/05/2015  . Umbilical hernia 11/23/863  . Vaginal discharge 05/05/2015    Patient Active Problem List   Diagnosis Date Noted  . Trichimoniasis 10/15/2019  . Vaginal odor 10/09/2019  . Vaginal bleeding 10/09/2019  . Screening examination for STD (sexually transmitted disease) 10/09/2019  . Elevated BP without diagnosis of hypertension 10/09/2019  . Iron deficiency anemia due to chronic blood loss 10/09/2019  . Uterine fibroids 09/11/2017  . Screening for colorectal cancer 06/27/2017  . History of anemia 05/05/2016  . Irregular bleeding 05/05/2015  . Retained tampon 05/05/2015  . Umbilical hernia 78/46/9629  . Environmental allergies 12/30/2014  . History of herpes simplex infection 04/21/2014    Past Surgical History:  Procedure Laterality Date  . CERVICAL CERCLAGE  2010 and 2004   [employer] cerclage  . CESAREAN SECTION  2012   [facility]  . UMBILICAL HERNIA REPAIR  01/30/2012   Procedure: HERNIA REPAIR UMBILICAL ADULT;  Surgeon: Donato Heinz,  MD;  Location: [facility];  Service: General;  Laterality: N/A;  With mesh     OB History    Gravida  5   Para  3   Term      Preterm      AB  2   Living  3     SAB  2   TAB      Ectopic      Multiple      Live Births              Family History  Problem Relation Age of Onset  . Diabetes Mother   . Hypertension Mother   . Diabetes Father   . Hypertension Father   . Asthma Daughter   . Cancer Maternal Grandfather        prostate  . Asthma Daughter   . Anesthesia problems Neg Hx   . Hypotension Neg Hx   . Malignant hyperthermia Neg Hx   . Pseudochol deficiency Neg Hx     Social History   Tobacco Use  . Smoking status: Never Smoker  . Smokeless tobacco: Never Used  Vaping Use  . Vaping Use: Never used  Substance Use Topics  . Alcohol use: No  . Drug use: No    Home Medications Prior to Admission medications   Medication Sig Start Date End Date Taking? Authorizing Provider  aspirin-sod bicarb-citric acid (ALKA-SELTZER) 325 MG TBEF tablet Take 325 mg by mouth once  as needed (for headache).    [provider]  cetirizine (ZYRTEC) 10 MG tablet Take 1 tablet (10 mg total) by mouth daily. Patient taking differently: Take 10 mg by mouth daily as needed for allergies.  09/07/17   Estill Dooms, NP  ferrous sulfate 325 (65 FE) MG EC tablet Take 325 mg by mouth daily.    [provider]  fluticasone (FLONASE) 50 MCG/ACT nasal spray Place 2 sprays into both nostrils daily.    [provider]  guaiFENesin (MUCINEX) 600 MG 12 hr tablet Take 600 mg by mouth once as needed for to loosen phlegm.    [provider]  HEATHER 0.35 MG tablet TAKE 1 TABLET BY MOUTH ONCE DAILY. Patient taking differently: Take 1 tablet by mouth daily.  11/23/17   Roma Schanz, CNM  metroNIDAZOLE (FLAGYL) 500 MG tablet TAKE 1 TABLET BY MOUTH TWICE DAILY 04/06/20   Estill Dooms, NP  Multiple Vitamin (MULTIVITAMIN) tablet Take 1 tablet by  mouth daily.    [provider]  valACYclovir (VALTREX) 1000 MG tablet TAKE (1) TABLET BY MOUTH ONCE DAILY. Patient taking differently: Take 500 mg by mouth daily as needed (for outbreak).  10/09/19   Estill Dooms, NP    Allergies    Sulfa antibiotics  Review of Systems   Review of Systems  Skin: Positive for wound.  Neurological: Negative for weakness and numbness.  All other systems reviewed and are negative.   Physical Exam Updated Vital Signs BP (!) 151/89 (BP Location: Right Arm)   Pulse 72   Temp 98.7 F (37.1 C) (Oral)   Resp 18   Ht 5\' 2"  (1.575 m)   Wt 73.9 kg   LMP 04/27/2020   SpO2 100%   BMI 29.81 kg/m   Physical Exam Vitals and nursing note reviewed.  Constitutional:      Appearance: She is well-developed.  HENT:     Head: Normocephalic and atraumatic.  Eyes:     General: No scleral icterus.       Right eye: No discharge.        Left eye: No discharge.     Conjunctiva/sclera: Conjunctivae normal.  Cardiovascular:     Pulses:          Radial pulses are 2+ on the right side and 2+ on the left side.  Pulmonary:     Effort: Pulmonary effort is normal.  Musculoskeletal:     Comments: Flexion/extension of all of right index finger intact any difficulty.  She has full flexion of both the DIP and the PIP.  Skin:    General: Skin is warm and dry.     Comments: 2 cm laceration noted to the dorsal aspect of the index finger just proximal to the MCP. Good distal cap refill.  RUE is not dusky in appearance or cool to touch.  Neurological:     Mental Status: She is alert.     Comments: Sensation intact along major nerve distributions of BUE  Psychiatric:        Speech: Speech normal.        Behavior: Behavior normal.     ED Results / Procedures / Treatments   Labs (all labs ordered are listed, but only abnormal results are displayed) Labs Reviewed - No data to display  EKG None  Radiology DG Finger Index Right  Result Date:  05/12/2020 CLINICAL DATA:  Laceration EXAM: RIGHT INDEX FINGER 2+V COMPARISON:  None. FINDINGS: There is no  evidence of fracture or dislocation. There is no evidence of arthropathy or other focal bone abnormality. Soft tissues are unremarkable. IMPRESSION: Negative. Electronically Signed   By: Constance Holster M.D.   On: 05/12/2020 22:31    Procedures .Marland KitchenLaceration Repair  Date/Time: 05/12/2020 11:01 PM Performed by: Volanda Napoleon, PA-C Authorized by: Volanda Napoleon, PA-C   Consent:    Consent obtained:  Verbal   Consent given by:  Patient   Risks discussed:  Infection, need for additional repair, pain, poor cosmetic result and poor wound healing   Alternatives discussed:  No treatment and delayed treatment Universal protocol:    Procedure explained and questions answered to patient or proxy's satisfaction: yes     Relevant documents present and verified: yes     Test results available and properly labeled: yes     Imaging studies available: yes     Required blood products, implants, devices, and special equipment available: yes     Site/side marked: yes     Immediately prior to procedure, a time out was called: yes     Patient identity confirmed:  Verbally with patient Anesthesia (see MAR for exact dosages):    Anesthesia method:  Local infiltration   Local anesthetic:  Lidocaine 1% w/o epi Laceration details:    Location:  Finger   Finger location:  R index finger   Length (cm):  2 Repair type:    Repair type:  Intermediate Pre-procedure details:    Preparation:  Patient was prepped and draped in usual sterile fashion Exploration:    Hemostasis achieved with:  Direct pressure   Wound exploration: wound explored through full range of motion     Wound extent: no foreign bodies/material noted   Treatment:    Area cleansed with:  Betadine   Amount of cleaning:  Extensive   Irrigation solution:  Sterile saline   Irrigation method:  Syringe   Visualized foreign  bodies/material removed: no   Skin repair:    Repair method:  Sutures   Suture size:  5-0   Suture material:  Nylon   Suture technique:  Simple interrupted   Number of sutures:  5 Approximation:    Approximation:  Close Post-procedure details:    Dressing:  Antibiotic ointment, non-adherent dressing and splint for protection   Patient tolerance of procedure:  Tolerated well, no immediate complications   (including critical care time)  Medications Ordered in ED Medications  lidocaine (PF) (XYLOCAINE) 1 % injection 10 mL (has no administration in time range)  bacitracin ointment (has no administration in time range)  polymixin-bacitracin (POLYSPORIN) ointment (has no administration in time range)  HYDROcodone-acetaminophen (NORCO/VICODIN) 5-325 MG per tablet 1 tablet (1 tablet Oral Given 05/12/20 2208)  Tdap (BOOSTRIX) injection 0.5 mL (0.5 mLs Intramuscular Given 05/12/20 2209)    ED Course  I have reviewed the triage vital signs and the nursing notes.  Pertinent labs & imaging results that were available during my care of the patient were reviewed by me and considered in my medical decision making (see chart for details).    MDM Rules/Calculators/A&P                          49 year old female who presents for evaluation of laceration to right index finger that occurred 20 minutes prior to ED arrival.  Reports that she think she had it on a broken glass.  Tetanus is not up-to-date.  She is not currently on blood  thinners.  On initially arrival, she has a 2 cm laceration noted to the dorsal right index finger. Plan for wound care, TDAP, and XR for evaluation of any acute bony abnormality.   Once the wound was anesthetized, sterilely simply irrigated with sterile saline.  I had her fully range of motion the finger which showed no signs of tendon involvement.  Exam not concerning for tendon disruption.  Laceration repaired as documented above.  Patient tolerated procedure well. At this  time, patient exhibits no emergent life-threatening condition that require further evaluation in ED or admission. Patient had ample opportunity for questions and discussion. All patient's questions were answered with full understanding. Strict return precautions discussed. Patient expresses understanding and agreement to plan.   Portions of this note were generated with [employer]obby[employer]. Dictation errors may occur despite best attempts at proofreading.  Final Clinical Impression(s) / ED Diagnoses Final diagnoses:  Laceration of right index finger without foreign body without damage to nail, initial encounter    Rx / DC Orders ED Discharge Orders    None       Desma Mcgregor 05/12/20 2302    Virgel Manifold, MD 05/14/20 [phone]

## 2020-05-12 NOTE — Discharge Instructions (Signed)
Keep the wound clean and dry for the first 24 hours. After that you may gently clean the wound with soap and water. Make sure to pat dry the wound before covering it with any dressing. You can use topical antibiotic ointment and bandage. Ice and elevate for pain relief.  ° °You can take Tylenol or Ibuprofen as directed for pain. You can alternate Tylenol and Ibuprofen every 4 hours for additional pain relief.  ° °Return to the Emergency Department, your primary care doctor, or the [facility] in 5-7 days for suture removal.  ° °Monitor closely for any signs of infection. Return to the Emergency Department for any worsening redness/swelling of the area that begins to spread, drainage from the site, worsening pain, fever or any other worsening or concerning symptoms.  ° ° °

## 2020-05-12 NOTE — ED Triage Notes (Signed)
Patient cut her right index finger on dishes in the sink. Patient has a bout a 1 inch laceration to her right index finger.

## 2020-05-20 ENCOUNTER — Other Ambulatory Visit: Payer: Self-pay

## 2020-05-20 ENCOUNTER — Ambulatory Visit
Admission: EM | Admit: 2020-05-20 | Discharge: 2020-05-20 | Disposition: A | Discharge status: Home / Self Care | Payer: BC Managed Care – PPO

## 2020-05-20 NOTE — ED Triage Notes (Signed)
Suture removal

## 2020-06-01 ENCOUNTER — Other Ambulatory Visit: Payer: Self-pay | Admitting: Adult Health

## 2020-07-16 ENCOUNTER — Other Ambulatory Visit: Payer: Self-pay

## 2020-08-31 DIAGNOSIS — N6324 Unspecified lump in the left breast, lower inner quadrant: Secondary | ICD-10-CM | POA: Diagnosis not present

## 2020-08-31 DIAGNOSIS — R928 Other abnormal and inconclusive findings on diagnostic imaging of breast: Secondary | ICD-10-CM | POA: Diagnosis not present

## 2020-08-31 DIAGNOSIS — N6323 Unspecified lump in the left breast, lower outer quadrant: Secondary | ICD-10-CM | POA: Diagnosis not present

## 2020-09-08 ENCOUNTER — Other Ambulatory Visit: Payer: Self-pay

## 2020-09-08 ENCOUNTER — Encounter (HOSPITAL_COMMUNITY): Payer: Self-pay | Admitting: Emergency Medicine

## 2020-09-08 DIAGNOSIS — M25562 Pain in left knee: Secondary | ICD-10-CM | POA: Diagnosis not present

## 2020-09-08 DIAGNOSIS — Z5321 Procedure and treatment not carried out due to patient leaving prior to being seen by health care provider: Secondary | ICD-10-CM | POA: Insufficient documentation

## 2020-09-08 NOTE — ED Triage Notes (Signed)
Pt c/o swelling and pressure in her left knee.

## 2020-09-09 ENCOUNTER — Emergency Department (HOSPITAL_COMMUNITY)
Admission: EM | Admit: 2020-09-09 | Discharge: 2020-09-09 | Disposition: A | Discharge status: Home / Self Care | Payer: BC Managed Care – PPO | Attending: Emergency Medicine | Admitting: Emergency Medicine

## 2020-09-09 ENCOUNTER — Encounter (HOSPITAL_COMMUNITY): Payer: Self-pay | Admitting: Emergency Medicine

## 2020-09-09 ENCOUNTER — Other Ambulatory Visit: Payer: Self-pay

## 2020-09-09 ENCOUNTER — Emergency Department (HOSPITAL_COMMUNITY): Payer: BC Managed Care – PPO

## 2020-09-09 ENCOUNTER — Emergency Department (HOSPITAL_COMMUNITY)
Admission: EM | Admit: 2020-09-09 | Discharge: 2020-09-10 | Disposition: A | Discharge status: Home / Self Care | Payer: BC Managed Care – PPO | Source: Home / Self Care | Attending: Emergency Medicine | Admitting: Emergency Medicine

## 2020-09-09 DIAGNOSIS — M25562 Pain in left knee: Secondary | ICD-10-CM

## 2020-09-09 NOTE — ED Triage Notes (Signed)
Pt c/o swelling and pressure in her left knee.

## 2020-09-10 NOTE — ED Notes (Signed)
Patient discharged to home.  All discharge instructions reviewed.  Patient able to demonstrate understanding via teachback method.  Wheelchair out of ED.  

## 2020-09-10 NOTE — Discharge Instructions (Signed)
Apply ice for 30 minutes at a time, 3-4 times a day, as needed.  Continue taking naproxen (Aleve) twice a day as needed.  Follow-up with the orthopedic physician.

## 2020-09-10 NOTE — ED Provider Notes (Signed)
[facility] EMERGENCY DEPARTMENT Provider Note   CSN: [id number] Arrival date & time: 09/09/20  2138   History Chief Complaint  Patient presents with  . Knee Pain    Donna [name] is a 49 y.o. female.  The history is provided by the patient.  Knee Pain She complains of pain in her left knee for about the last 3 weeks.  Pain bothers her most when she has been standing for period of time and is aching when she is going to sleep.  Pain is well localized and is just medial to the patella.  She has not noted any swelling.  When she takes naproxen, pain goes away.  She denies any unusual activity or trauma, but she does have history of being bowlegged.  She has not had knee problems previously.  Past Medical History:  Diagnosis Date  . Anemia   . Contraceptive management 04/21/2014  . Environmental allergies 12/30/2014  . Herpes   . History of anemia 05/05/2016  . History of herpes simplex infection 04/21/2014  . Irregular bleeding 05/05/2015  . Itching in the vaginal area 05/05/2016  . Retained tampon 05/05/2015  . Umbilical hernia 7/82/9562  . Vaginal discharge 05/05/2015    Patient Active Problem List   Diagnosis Date Noted  . Trichimoniasis 10/15/2019  . Vaginal odor 10/09/2019  . Vaginal bleeding 10/09/2019  . Screening examination for STD (sexually transmitted disease) 10/09/2019  . Elevated BP without diagnosis of hypertension 10/09/2019  . Iron deficiency anemia due to chronic blood loss 10/09/2019  . Uterine fibroids 09/11/2017  . Screening for colorectal cancer 06/27/2017  . History of anemia 05/05/2016  . Irregular bleeding 05/05/2015  . Retained tampon 05/05/2015  . Umbilical hernia 13/05/6577  . Environmental allergies 12/30/2014  . History of herpes simplex infection 04/21/2014    Past Surgical History:  Procedure Laterality Date  . CERVICAL CERCLAGE  2010 and 2004   [employer] cerclage  . CESAREAN SECTION  2012   [facility]  . UMBILICAL HERNIA REPAIR   01/30/2012   Procedure: HERNIA REPAIR UMBILICAL ADULT;  Surgeon: Donato Heinz, MD;  Location: [facility];  Service: General;  Laterality: N/A;  With mesh     OB History    Gravida  5   Para  3   Term      Preterm      AB  2   Living  3     SAB  2   TAB      Ectopic      Multiple      Live Births              Family History  Problem Relation Age of Onset  . Diabetes Mother   . Hypertension Mother   . Diabetes Father   . Hypertension Father   . Asthma Daughter   . Cancer Maternal Grandfather        prostate  . Asthma Daughter   . Anesthesia problems Neg Hx   . Hypotension Neg Hx   . Malignant hyperthermia Neg Hx   . Pseudochol deficiency Neg Hx     Social History   Tobacco Use  . Smoking status: Never Smoker  . Smokeless tobacco: Never Used  Vaping Use  . Vaping Use: Never used  Substance Use Topics  . Alcohol use: No  . Drug use: No    Home Medications Prior to Admission medications   Medication Sig Start Date End Date Taking? Authorizing Provider  aspirin-sod bicarb-citric  acid (ALKA-SELTZER) 325 MG TBEF tablet Take 325 mg by mouth once as needed (for headache).    [provider]  cetirizine (ZYRTEC) 10 MG tablet Take 1 tablet (10 mg total) by mouth daily. Patient taking differently: Take 10 mg by mouth daily as needed for allergies.  09/07/17   Estill Dooms, NP  ferrous sulfate 325 (65 FE) MG EC tablet Take 325 mg by mouth daily.    [provider]  fluticasone (FLONASE) 50 MCG/ACT nasal spray Place 2 sprays into both nostrils daily.    [provider]  guaiFENesin (MUCINEX) 600 MG 12 hr tablet Take 600 mg by mouth once as needed for to loosen phlegm.    [provider]  HEATHER 0.35 MG tablet TAKE 1 TABLET BY MOUTH ONCE DAILY. Patient taking differently: Take 1 tablet by mouth daily.  11/23/17   Roma Schanz, CNM  metroNIDAZOLE (FLAGYL) 500 MG tablet TAKE 1 TABLET BY MOUTH TWICE DAILY 06/01/20    Estill Dooms, NP  Multiple Vitamin (MULTIVITAMIN) tablet Take 1 tablet by mouth daily.    [provider]  valACYclovir (VALTREX) 1000 MG tablet TAKE (1) TABLET BY MOUTH ONCE DAILY. Patient taking differently: Take 500 mg by mouth daily as needed (for outbreak).  10/09/19   Estill Dooms, NP    Allergies    Sulfa antibiotics  Review of Systems   Review of Systems  All other systems reviewed and are negative.   Physical Exam Updated Vital Signs BP (!) 123/91 (BP Location: Right Arm)   Pulse 90   Temp 98.5 F (36.9 C) (Oral)   Resp 18   Ht 5\' 2"  (1.575 m)   Wt 64.9 kg   LMP 08/17/2020   SpO2 100%   BMI 26.16 kg/m   Physical Exam Vitals and nursing note reviewed.   49 year old female, resting comfortably and in no acute distress. Vital signs are significant for mildly elevated blood pressure. Oxygen saturation is 100%, which is normal. Head is normocephalic and atraumatic. PERRLA, EOMI. Oropharynx is clear. Neck is nontender and supple without adenopathy or JVD. Back is nontender and there is no CVA tenderness. Lungs are clear without rales, wheezes, or rhonchi. Chest is nontender. Heart has regular rate and rhythm without murmur. Abdomen is soft, flat, nontender without masses or hepatosplenomegaly and peristalsis is normoactive. Extremities: There is no swelling of the left knee.  There is an area of tenderness just medial to the distal aspect of the left patella.  There is no instability on valgus or varus stress.  Lachman and McMurray's tests are normal. Skin is warm and dry without rash. Neurologic: Mental status is normal, cranial nerves are intact, there are no motor or sensory deficits.  ED Results / Procedures / Treatments    Radiology DG Knee Complete 4 Views Left  Result Date: 09/09/2020 CLINICAL DATA:  Left knee pain and swelling EXAM: LEFT KNEE - COMPLETE 4+ VIEW COMPARISON:  None. FINDINGS: Frontal, oblique, lateral, and sunrise views  of the left knee are obtained. No fracture, subluxation, or dislocation. Joint spaces are well preserved. No joint effusion. IMPRESSION: 1. No acute bony abnormality. Electronically Signed   By: Randa Ngo M.D.   On: 09/09/2020 23:03    Procedures Procedures   Medications Ordered in ED Medications - No data to display  ED Course  I have reviewed the triage vital signs and the nursing notes.  Pertinent imaging results that were available during my care of  the patient were reviewed by me and considered in my medical decision making (see chart for details).  MDM Rules/Calculators/A&P Pain left knee.  X-rays are normal.  I suspect this is a minor strain, no evidence of significant ligamentous injury or meniscus injury.  She is advised to apply ice and continue using naproxen.  She is referred to orthopedics for follow-up.  Final Clinical Impression(s) / ED Diagnoses Final diagnoses:  Acute pain of left knee    Rx / DC Orders ED Discharge Orders    None       Delora Fuel, MD 00/17/49 0126

## 2020-09-17 ENCOUNTER — Other Ambulatory Visit: Payer: Self-pay | Admitting: Adult Health

## 2020-10-14 ENCOUNTER — Ambulatory Visit: Payer: BC Managed Care – PPO | Admitting: Orthopedic Surgery

## 2020-10-16 ENCOUNTER — Other Ambulatory Visit: Payer: Self-pay

## 2020-10-16 ENCOUNTER — Ambulatory Visit (INDEPENDENT_AMBULATORY_CARE_PROVIDER_SITE_OTHER): Payer: BC Managed Care – PPO | Admitting: Orthopedic Surgery

## 2020-10-16 ENCOUNTER — Encounter: Payer: Self-pay | Admitting: Orthopedic Surgery

## 2020-10-16 VITALS — BP 137/92 | HR 87 | Ht 62.0 in | Wt 157.0 lb

## 2020-10-16 DIAGNOSIS — M1712 Unilateral primary osteoarthritis, left knee: Secondary | ICD-10-CM | POA: Diagnosis not present

## 2020-10-16 MED ORDER — DICLOFENAC-MISOPROSTOL 50-0.2 MG PO TBEC: 1.0000 | DELAYED_RELEASE_TABLET | Freq: Two times a day (BID) | ORAL | 0 refills | Status: DC

## 2020-10-16 NOTE — Progress Notes (Signed)
New Patient Visit  Assessment: Donna [name] is a 50 y.o. female with the following: Left knee arthritis; mild symptoms.   Plan: Donna [name] has arthritis in her left knee.  We reviewed the radiographs in clinic today, and I outlined the natural progression.  We had an extensive discussion regarding all potential treatment options, including continuing with the current treatment. NSAIDs are the most appropriate medications, and these are available OTC or via prescription.  I have urged them to remain active, and they can continue with activities on their own, or we can refer them to physical therapy.  We can also consider a brace, or compression sleeve. If the pain is severe enough, we can consider a steroid injection.    After discussing all of these options, the patient has elected to proceed with prescription medication, including arthrotec to control her symptoms.    Follow-up: Return if symptoms worsen or fail to improve.  Subjective:  Chief Complaint  Patient presents with  . Knee Pain    Left knee pain, Pt reports some tenderness on right inside of the knee,     History of Present Illness: Donna [name] is a 50 y.o. female who presents for evaluation of her left knee.  She has had progressively worsening left knee pain, with some mild symptoms in her right knee also.  She denies a history of an injury.  Pain started with prolonged standing.  Symptoms improve with NSAIDs.  No PT or previous injections.  No mechanical symptoms.    Review of Systems: No fevers or chills No numbness or tingling No chest pain No shortness of breath No bowel or bladder dysfunction No GI distress No headaches   Medical History:  Past Medical History:  Diagnosis Date  . Anemia   . Contraceptive management 04/21/2014  . Environmental allergies 12/30/2014  . Herpes   . History of anemia 05/05/2016  . History of herpes simplex infection 04/21/2014  . Irregular bleeding 05/05/2015  .  Itching in the vaginal area 05/05/2016  . Retained tampon 05/05/2015  . Umbilical hernia 9/93/7169  . Vaginal discharge 05/05/2015    Past Surgical History:  Procedure Laterality Date  . CERVICAL CERCLAGE  2010 and 2004   [employer] cerclage  . CESAREAN SECTION  2012   [facility]  . UMBILICAL HERNIA REPAIR  01/30/2012   Procedure: HERNIA REPAIR UMBILICAL ADULT;  Surgeon: Donato Heinz, MD;  Location: [facility];  Service: General;  Laterality: N/A;  With mesh    Family History  Problem Relation Age of Onset  . Diabetes Mother   . Hypertension Mother   . Diabetes Father   . Hypertension Father   . Asthma Daughter   . Cancer Maternal Grandfather        prostate  . Asthma Daughter   . Anesthesia problems Neg Hx   . Hypotension Neg Hx   . Malignant hyperthermia Neg Hx   . Pseudochol deficiency Neg Hx    Social History   Tobacco Use  . Smoking status: Never Smoker  . Smokeless tobacco: Never Used  Vaping Use  . Vaping Use: Never used  Substance Use Topics  . Alcohol use: No  . Drug use: No    Allergies  Allergen Reactions  . Sulfa Antibiotics Rash    Current Meds  Medication Sig  . aspirin-sod bicarb-citric acid (ALKA-SELTZER) 325 MG TBEF tablet Take 325 mg by mouth once as needed (for headache).  . cetirizine (ZYRTEC) 10 MG tablet Take  1 tablet (10 mg total) by mouth daily as needed for allergies.  . Diclofenac-miSOPROStol 50-0.2 MG TBEC Take 1 tablet by mouth 2 (two) times daily.  . ferrous sulfate 325 (65 FE) MG EC tablet Take 325 mg by mouth daily.  . fluticasone (FLONASE) 50 MCG/ACT nasal spray Place 2 sprays into both nostrils daily.  Marland Kitchen HEATHER 0.35 MG tablet TAKE 1 TABLET BY MOUTH ONCE DAILY. (Patient taking differently: Take 1 tablet by mouth daily.)  . Multiple Vitamin (MULTIVITAMIN) tablet Take 1 tablet by mouth daily.  . valACYclovir (VALTREX) 1000 MG tablet TAKE (1) TABLET BY MOUTH ONCE DAILY. (Patient taking differently: Take 500 mg by mouth daily as needed  (for outbreak).)    Objective: BP (!) 137/92   Pulse 87   Ht 5\' 2"  (1.575 m)   Wt 157 lb (71.2 kg)   BMI 28.72 kg/m   Physical Exam:  General: alert and oriented, no acute distress.  Gait: Normal  Bilateral varus alignment.  TTP medial joint line.  Negative McMurray's.  Negative Lachman.  Full ROM.  No pain with patellar grind.  Mild crepitus with ROM    IMAGING: I personally reviewed images previously obtained from the ED   XR left knee shows varus alignment.  Patellar tilt.  Mild loss of joint space medial compartment.  Small osteophytes patella.    New Medications:  Meds ordered this encounter  Medications  . Diclofenac-miSOPROStol 50-0.2 MG TBEC    Sig: Take 1 tablet by mouth 2 (two) times daily.    Dispense:  60 tablet    Refill:  0      Mordecai Rasmussen, MD  10/16/2020 10:57 PM

## 2020-12-03 ENCOUNTER — Other Ambulatory Visit: Payer: BC Managed Care – PPO | Admitting: Advanced Practice Midwife

## 2020-12-16 ENCOUNTER — Other Ambulatory Visit: Payer: Self-pay | Admitting: Adult Health

## 2020-12-24 NOTE — Progress Notes (Signed)
   WELL-WOMAN EXAMINATION Patient name: Donna [name] MRN [id number]  Date of birth: 09-06-1971 Chief Complaint:   Gynecologic Exam  History of Present Illness:   Donna [name] is a 50 y.o. [id number] African American female being seen today for a routine well-woman exam/pap only.   -h/o HMB with fibroids: last seen 09/2019 on POPs daily.  Plan was for [place], which she did not complete.  Today she notes that her periods are now regular to light while on the pill.  Denies intermenstrual bleeding.  Patient's last menstrual period was 12/21/2020 (exact date).  Last pap due today.  Last mammogram: up to date.  Depression screen [facility] 2/9 12/25/2020 10/09/2019 06/27/2017  Decreased Interest 0 0 0  Down, Depressed, Hopeless 0 0 0  PHQ - 2 Score 0 0 0  Altered sleeping 0 - -  Tired, decreased energy 1 - -  Change in appetite 0 - -  Feeling bad or failure about yourself  0 - -  Trouble concentrating 0 - -  Moving slowly or fidgety/restless 0 - -  Suicidal thoughts 0 - -  PHQ-9 Score 1 - -    Review of Systems:   Pertinent items are noted in HPI Denies any headaches, blurred vision, fatigue, shortness of breath, chest pain, abdominal pain, bowel movements, urination, or intercourse unless otherwise stated above.  Pertinent History Reviewed:  Reviewed past medical,surgical, social and family history.  Reviewed problem list, medications and allergies. Physical Assessment:   Vitals:   12/25/20 1230  BP: 131/83  Pulse: 80  Weight: 159 lb (72.1 kg)  Height: 5\' 2"  (1.575 m)  Body mass index is 29.08 kg/m.        Physical Examination:   General appearance - well appearing, and in no distress  Mental status - alert, oriented to person, place, and time  Psych:  She has a normal mood and affect  Skin - warm and dry, normal color, no suspicious lesions noted  Chest - effort normal, all lung fields clear to auscultation bilaterally  Heart - normal rate and regular rhythm  Abdomen - obese,  soft, nontender, nondistended, no masses or organomegaly  Pelvic - VULVA: normal appearing vulva with no masses, tenderness or lesions  VAGINA: normal appearing vagina with normal color and discharge, no lesions  CERVIX: normal appearing cervix without discharge or lesions, no CMT  Thin prep pap is done with HR HPV cotesting  UTERUS: uterus is felt to be normal size, shape, consistency and nontender   ADNEXA: No adnexal masses or tenderness noted.  Extremities:  No swelling or varicosities noted  Chaperone: Network engineer & Plan:  1) pap collected for cervical cancer screening  2) AUB-  Doing well with POPs and plan to continue -[place] not warranted at this time since current management is working well  No orders of the defined types were placed in this encounter.   Meds:  Meds ordered this encounter  Medications  . norethindrone (HEATHER) 0.35 MG tablet    Sig: Take 1 tablet (0.35 mg total) by mouth daily.    Dispense:  90 tablet    Refill:  4    Follow-up: Return in about 1 year (around 12/25/2021) for Annual, print AVS.   Janyth Pupa, DO Attending [place], [place] for [facility], [place]

## 2020-12-25 ENCOUNTER — Other Ambulatory Visit (HOSPITAL_COMMUNITY)
Admission: RE | Admit: 2020-12-25 | Discharge: 2020-12-25 | Disposition: A | Discharge status: Home / Self Care | Payer: Medicaid Other | Source: Ambulatory Visit | Attending: Obstetrics & Gynecology | Admitting: Obstetrics & Gynecology

## 2020-12-25 ENCOUNTER — Other Ambulatory Visit: Payer: Self-pay

## 2020-12-25 ENCOUNTER — Encounter: Payer: Self-pay | Admitting: Obstetrics & Gynecology

## 2020-12-25 ENCOUNTER — Ambulatory Visit: Payer: Medicaid Other | Admitting: Obstetrics & Gynecology

## 2020-12-25 VITALS — BP 131/83 | HR 80 | Ht 62.0 in | Wt 159.0 lb

## 2020-12-25 DIAGNOSIS — Z01419 Encounter for gynecological examination (general) (routine) without abnormal findings: Secondary | ICD-10-CM

## 2020-12-25 DIAGNOSIS — N939 Abnormal uterine and vaginal bleeding, unspecified: Secondary | ICD-10-CM

## 2020-12-25 MED ORDER — NORETHINDRONE 0.35 MG PO TABS: 1.0000 | ORAL_TABLET | Freq: Every day | ORAL | 4 refills | Status: DC

## 2020-12-25 NOTE — Patient Instructions (Signed)
Try any herbal supplement for vasomotor symptoms -black cohosh, evening primrose, soy products  [email or web address] Clan

## 2020-12-29 ENCOUNTER — Other Ambulatory Visit: Payer: Self-pay

## 2020-12-30 LAB — CYTOLOGY - PAP
Chlamydia: NEGATIVE
Comment: NEGATIVE
Comment: NEGATIVE
Comment: NORMAL
Diagnosis: NEGATIVE
Diagnosis: REACTIVE
High risk HPV: NEGATIVE
Neisseria Gonorrhea: NEGATIVE

## 2020-12-30 MED ORDER — DICLOFENAC-MISOPROSTOL 50-0.2 MG PO TBEC: 1.0000 | DELAYED_RELEASE_TABLET | Freq: Two times a day (BID) | ORAL | 0 refills | Status: DC

## 2020-12-31 ENCOUNTER — Other Ambulatory Visit: Payer: Self-pay

## 2021-01-01 ENCOUNTER — Telehealth: Payer: Self-pay | Admitting: Radiology

## 2021-01-01 MED ORDER — MELOXICAM 7.5 MG PO TABS: 7.5000 mg | ORAL_TABLET | Freq: Every day | ORAL | 1 refills | Status: DC

## 2021-01-01 NOTE — Telephone Encounter (Signed)
Diclofenac-misoprostol is a non preferred drug with [place] [employer] and will require PA.  Would you like to prescribe an alternative?  Or have [place] do the PA?

## 2021-01-04 ENCOUNTER — Other Ambulatory Visit: Payer: Medicaid Other

## 2021-01-06 ENCOUNTER — Other Ambulatory Visit: Payer: Medicaid Other

## 2021-01-07 ENCOUNTER — Other Ambulatory Visit (HOSPITAL_COMMUNITY)
Admission: RE | Admit: 2021-01-07 | Discharge: 2021-01-07 | Disposition: A | Discharge status: Home / Self Care | Payer: Medicaid Other | Source: Ambulatory Visit | Attending: Obstetrics & Gynecology | Admitting: Obstetrics & Gynecology

## 2021-01-07 ENCOUNTER — Other Ambulatory Visit: Payer: Self-pay

## 2021-01-07 ENCOUNTER — Other Ambulatory Visit (INDEPENDENT_AMBULATORY_CARE_PROVIDER_SITE_OTHER): Payer: Medicaid Other | Admitting: *Deleted

## 2021-01-07 DIAGNOSIS — N898 Other specified noninflammatory disorders of vagina: Secondary | ICD-10-CM | POA: Insufficient documentation

## 2021-01-07 NOTE — Progress Notes (Signed)
   NURSE VISIT- VAGINITIS/STD  SUBJECTIVE:  Donna [name] is a 50 y.o. [id number] GYN patientfemale here for a vaginal swab for vaginitis screening, STD screen.  She reports the following symptoms: vaginal discharge with odor for 2 weeks. Denies abnormal vaginal bleeding, significant pelvic pain, fever, or UTI symptoms.  OBJECTIVE:  LMP 12/21/2020 (Exact Date)   Appears well, in no apparent distress  ASSESSMENT: Vaginal swab for vaginitis screening & STD screen  PLAN: Self-collected vaginal probe for Trichomonas, Bacterial Vaginosis, Yeast sent to lab Treatment: to be determined once results are received Follow-up as needed if symptoms persist/worsen, or new symptoms develop  Levy Pupa  01/07/2021 4:51 PM

## 2021-01-07 NOTE — Progress Notes (Signed)
Chart reviewed for nurse visit. Agree with plan of care.  Estill Dooms, NP 01/07/2021 4:57 PM

## 2021-01-10 LAB — CERVICOVAGINAL ANCILLARY ONLY
Bacterial Vaginitis (gardnerella): POSITIVE — AB
Candida Glabrata: NEGATIVE
Candida Vaginitis: NEGATIVE
Comment: NEGATIVE
Comment: NEGATIVE
Comment: NEGATIVE
Comment: NEGATIVE
Trichomonas: NEGATIVE

## 2021-01-11 ENCOUNTER — Other Ambulatory Visit: Payer: Self-pay | Admitting: Adult Health

## 2021-01-11 MED ORDER — METRONIDAZOLE 500 MG PO TABS: 500.0000 mg | ORAL_TABLET | Freq: Two times a day (BID) | ORAL | 0 refills | Status: DC

## 2021-01-11 NOTE — Progress Notes (Signed)
Vaginal swab +BV, will rx flagyl

## 2021-02-02 ENCOUNTER — Encounter: Payer: Self-pay | Admitting: Emergency Medicine

## 2021-02-02 ENCOUNTER — Ambulatory Visit
Admission: EM | Admit: 2021-02-02 | Discharge: 2021-02-02 | Disposition: A | Discharge status: Home / Self Care | Payer: Medicaid Other | Attending: Emergency Medicine | Admitting: Emergency Medicine

## 2021-02-02 DIAGNOSIS — R0789 Other chest pain: Secondary | ICD-10-CM

## 2021-02-02 DIAGNOSIS — R079 Chest pain, unspecified: Secondary | ICD-10-CM | POA: Diagnosis not present

## 2021-02-02 MED ORDER — KETOROLAC TROMETHAMINE 60 MG/2ML IM SOLN
60.0000 mg | Freq: Once | INTRAMUSCULAR | Status: AC
  Administered 2021-02-02: 60 mg via INTRAMUSCULAR

## 2021-02-02 MED ORDER — MELOXICAM 15 MG PO TABS: 15.0000 mg | ORAL_TABLET | Freq: Every day | ORAL | 0 refills | Status: DC

## 2021-02-02 NOTE — ED Triage Notes (Signed)
Right sided chest pain that started Friday, hurts worse with a deep breath.  States she has been coughing up yellow sputum

## 2021-02-02 NOTE — Discharge Instructions (Addendum)
Unable to rule out cardiac disease or blood clot in urgent care setting.  Offered patient further evaluation and management in the ED.  Patient declines at this time and would like to try outpatient therapy first.  Aware of the risk associated with this decision including missed diagnosis, organ damage, organ failure, and/or death.  Patient aware and in agreement.     Will trial treatment for musculoskeletal pain Toradol shot given in office mobic prescribed.   Follow up with PCP for further evaluation and management

## 2021-02-02 NOTE — ED Provider Notes (Signed)
[facility]   [id number] 02/02/21 Arrival Time: 1659   CC: CHEST PAIN  SUBJECTIVE:  Donna [name] is a 50 y.o. female who presents with complaint of chest pressure x 4 days.  Denies a precipitating event, trauma, recent lower respiratory tract, or strenuous upper body activities.  Localizes chest pain to the RT upper chest.  Describes as intermittent (with episodes lasting a few seconds at a time) and pressure in character.  Has tried acid reflux medication with temporary relief.  Symptoms made worse with deep breath.  Denies previous symptoms in the past.  Denies fever, chills, lightheadedness, dizziness, palpitations, tachycardia, SOB, nausea, vomiting, abdominal pain, changes in bowel or bladder habits, diaphoresis, numbness/tingling in extremities, peripheral edema, or anxiety.    Denies SOB, calf pain or swelling, recent long travel, recent surgery, pregnancy, malignancy, tobacco use, hormone use, or previous blood clot  Denies close relatives with cardiac hx  ROS: As per HPI.  All other pertinent ROS negative.    Past Medical History:  Diagnosis Date  . Anemia   . Contraceptive management 04/21/2014  . Environmental allergies 12/30/2014  . Herpes   . History of anemia 05/05/2016  . History of herpes simplex infection 04/21/2014  . Irregular bleeding 05/05/2015  . Itching in the vaginal area 05/05/2016  . Retained tampon 05/05/2015  . Umbilical hernia 6/60/6301  . Vaginal discharge 05/05/2015   Past Surgical History:  Procedure Laterality Date  . CERVICAL CERCLAGE  2010 and 2004   [employer] cerclage  . CESAREAN SECTION  2012   [facility]  . UMBILICAL HERNIA REPAIR  01/30/2012   Procedure: HERNIA REPAIR UMBILICAL ADULT;  Surgeon: Donato Heinz, MD;  Location: [facility];  Service: General;  Laterality: N/A;  With mesh   Allergies  Allergen Reactions  . Sulfa Antibiotics Rash   No current facility-administered medications on file prior to encounter.   Current  Outpatient Medications on File Prior to Encounter  Medication Sig Dispense Refill  . aspirin-sod bicarb-citric acid (ALKA-SELTZER) 325 MG TBEF tablet Take 325 mg by mouth once as needed (for headache).    . ferrous sulfate 325 (65 FE) MG EC tablet Take 325 mg by mouth daily.    . Multiple Vitamin (MULTIVITAMIN) tablet Take 1 tablet by mouth daily.    . norethindrone (HEATHER) 0.35 MG tablet Take 1 tablet (0.35 mg total) by mouth daily. 90 tablet 4  . valACYclovir (VALTREX) 1000 MG tablet Take 0.5 tablets (500 mg total) by mouth daily as needed (for outbreak). 20 tablet 3  . [DISCONTINUED] cetirizine (ZYRTEC) 10 MG tablet Take 1 tablet (10 mg total) by mouth daily as needed for allergies. 30 tablet prn  . [DISCONTINUED] fluticasone (FLONASE) 50 MCG/ACT nasal spray Place 2 sprays into both nostrils daily.     Social History   Socioeconomic History  . Marital status: Soil scientist    Spouse name: Not on file  . Number of children: Not on file  . Years of education: Not on file  . Highest education level: Not on file  Occupational History  . Not on file  Tobacco Use  . Smoking status: Never Smoker  . Smokeless tobacco: Never Used  Vaping Use  . Vaping Use: Never used  Substance and Sexual Activity  . Alcohol use: No  . Drug use: No  . Sexual activity: Yes    Birth control/protection: Pill  Other Topics Concern  . Not on file  Social History Narrative  . Not on file  Social Determinants of Health   Financial Resource Strain: Low Risk   . Difficulty of Paying Living Expenses: Not hard at all  Food Insecurity: No Food Insecurity  . Worried About Charity fundraiser in the Last Year: Never true  . Ran Out of Food in the Last Year: Never true  Transportation Needs: No Transportation Needs  . Lack of Transportation (Medical): No  . Lack of Transportation (Non-Medical): No  Physical Activity: Insufficiently Active  . Days of Exercise per Week: 2 days  . Minutes of Exercise per  Session: 30 min  Stress: No Stress Concern Present  . Feeling of Stress : Only a little  Social Connections: Socially Integrated  . Frequency of Communication with Friends and Family: More than three times a week  . Frequency of Social Gatherings with Friends and Family: More than three times a week  . Attends Religious Services: More than 4 times per year  . Active Member of Clubs or Organizations: Yes  . Attends Archivist Meetings: More than 4 times per year  . Marital Status: Living with partner  Intimate Partner Violence: Not At Risk  . Fear of Current or Ex-Partner: No  . Emotionally Abused: No  . Physically Abused: No  . Sexually Abused: No   Family History  Problem Relation Age of Onset  . Diabetes Mother   . Hypertension Mother   . Diabetes Father   . Hypertension Father   . Asthma Daughter   . Cancer Maternal Grandfather        prostate  . Asthma Daughter   . Anesthesia problems Neg Hx   . Hypotension Neg Hx   . Malignant hyperthermia Neg Hx   . Pseudochol deficiency Neg Hx      OBJECTIVE:  Vitals:   02/02/21 1704  BP: 138/87  Pulse: 97  Resp: 18  Temp: 98.3 F (36.8 C)  TempSrc: Oral  SpO2: 99%    General appearance: alert; no distress Eyes: PERRLA; EOMI; conjunctiva normal HENT: normocephalic; atraumatic Neck: supple Lungs: clear to auscultation bilaterally without adventitious breath sounds Heart: regular rate and rhythm.  Clear S1 and S2 without rubs, gallops, or murmur. Abdomen: soft, non-tender; bowel sounds normal; no masses or organomegaly; no guarding or rebound tenderness Extremities: no cyanosis or edema; symmetrical with no gross deformities; NTTP over shoulder, clavicle, and trapezius Skin: warm and dry Psychological: alert and cooperative; normal mood and affect  ECG:  EKG normal sinus rhythm without ST elevations, depressions, or prolonged PR interval.  No narrowing or widening of the QRS complexes.   ASSESSMENT &  PLAN:  1. Chest pain, unspecified type   2. Pressure in right side of chest     Meds ordered this encounter  Medications  . meloxicam (MOBIC) 15 MG tablet    Sig: Take 1 tablet (15 mg total) by mouth daily.    Dispense:  20 tablet    Refill:  0    Order Specific Question:   Supervising Provider    Answer:   Raylene Everts [[id number]]  . ketorolac (TORADOL) injection 60 mg   Unable to rule out cardiac disease or blood clot in urgent care setting.  Offered patient further evaluation and management in the ED.  Patient declines at this time and would like to try outpatient therapy first.  Aware of the risk associated with this decision including missed diagnosis, organ damage, organ failure, and/or death.  Patient aware and in agreement.     Will  trial treatment for musculoskeletal pain Toradol shot given in office mobic prescribed.   Follow up with PCP for further evaluation and management  Chest pain precautions given. Reviewed expectations re: course of current medical issues. Questions answered. Outlined signs and symptoms indicating need for more acute intervention. Patient verbalized understanding. After Visit Summary given.   Lestine Box, PA-C 02/02/21 1746

## 2021-03-03 ENCOUNTER — Ambulatory Visit: Payer: Medicaid Other | Admitting: Adult Health

## 2021-03-18 ENCOUNTER — Ambulatory Visit: Payer: Medicaid Other | Admitting: Adult Health

## 2021-03-24 DIAGNOSIS — R922 Inconclusive mammogram: Secondary | ICD-10-CM | POA: Diagnosis not present

## 2021-03-24 DIAGNOSIS — N6012 Diffuse cystic mastopathy of left breast: Secondary | ICD-10-CM | POA: Diagnosis not present

## 2021-03-31 ENCOUNTER — Other Ambulatory Visit (INDEPENDENT_AMBULATORY_CARE_PROVIDER_SITE_OTHER): Payer: Medicaid Other | Admitting: *Deleted

## 2021-03-31 ENCOUNTER — Other Ambulatory Visit: Payer: Self-pay

## 2021-03-31 DIAGNOSIS — R8290 Unspecified abnormal findings in urine: Secondary | ICD-10-CM | POA: Diagnosis not present

## 2021-03-31 LAB — POCT URINALYSIS DIPSTICK
Blood, UA: NEGATIVE
Glucose, UA: NEGATIVE
Ketones, UA: NEGATIVE
Leukocytes, UA: NEGATIVE
Nitrite, UA: NEGATIVE
Protein, UA: NEGATIVE

## 2021-03-31 NOTE — Progress Notes (Addendum)
   NURSE VISIT- UTI SYMPTOMS   SUBJECTIVE:  Donna [name] is a 50 y.o. [id number] female here for UTI symptoms. She is a GYN patient. She reports  abnormal odor x 2 weeks .  OBJECTIVE:  There were no vitals taken for this visit.  Appears well, in no apparent distress  Results for orders placed or performed in visit on 03/31/21 (from the past 24 hour(s))  POCT Urinalysis Dipstick   Collection Time: 03/31/21  1:46 PM  Result Value Ref Range   Color, UA     Clarity, UA     Glucose, UA Negative Negative   Bilirubin, UA     Ketones, UA neg    Spec Grav, UA     Blood, UA neg    pH, UA     Protein, UA Negative Negative   Urobilinogen, UA     Nitrite, UA neg    Leukocytes, UA Negative Negative   Appearance     Odor      ASSESSMENT: GYN patient with UTI symptoms and negative nitrites.  PLAN: Note routed to Knute Neu, CNM, [facility]   Rx sent by provider today: No Urine culture sent Call or return to clinic prn if these symptoms worsen or fail to improve as anticipated. Follow-up: as needed   Levy Pupa  03/31/2021 1:53 PM  Chart reviewed for nurse visit. Agree with plan of care.  Roma Schanz, [place] 03/31/2021 2:09 PM

## 2021-04-03 LAB — URINE CULTURE

## 2021-08-06 ENCOUNTER — Encounter: Payer: Self-pay | Admitting: Emergency Medicine

## 2021-08-06 ENCOUNTER — Ambulatory Visit
Admission: EM | Admit: 2021-08-06 | Discharge: 2021-08-06 | Disposition: A | Discharge status: Home / Self Care | Payer: Medicaid Other | Attending: Urgent Care | Admitting: Urgent Care

## 2021-08-06 ENCOUNTER — Other Ambulatory Visit: Payer: Self-pay

## 2021-08-06 DIAGNOSIS — M25562 Pain in left knee: Secondary | ICD-10-CM

## 2021-08-06 MED ORDER — PREDNISONE 10 MG PO TABS: 30.0000 mg | ORAL_TABLET | Freq: Every day | ORAL | 0 refills | Status: DC

## 2021-08-06 NOTE — ED Provider Notes (Signed)
Donna [name]   MRN: [id number] DOB: [birth year]  Subjective:   Donna [name] is a 50 y.o. female presenting for 1 week history of recurrent left knee pain with swelling.  Symptoms are primarily problematic when she first wakes up in the morning, feels like a burning sensation over the medial aspect of her knee together with pain and difficulty walking.  No trauma, fall, knee buckling, hot sensation.  No history of gout.  Patient states that she saw an orthopedist earlier this year and was told she had a little bit of arthritis.  They discussed the possibility of doing knee injections but unfortunately she has not followed up with them.  She has an appointment next week.  X-rays were done from December 2021 and were negative.  No current facility-administered medications for this encounter.  Current Outpatient Medications:    aspirin-sod bicarb-citric acid (ALKA-SELTZER) 325 MG TBEF tablet, Take 325 mg by mouth once as needed (for headache)., Disp: , Rfl:    ferrous sulfate 325 (65 FE) MG EC tablet, Take 325 mg by mouth daily., Disp: , Rfl:    fluticasone (FLONASE) 50 MCG/ACT nasal spray, Place 1 spray into both nostrils daily., Disp: , Rfl:    meloxicam (MOBIC) 15 MG tablet, Take 1 tablet (15 mg total) by mouth daily. (Patient taking differently: Take 15 mg by mouth as needed.), Disp: 20 tablet, Rfl: 0   Multiple Vitamin (MULTIVITAMIN) tablet, Take 1 tablet by mouth daily., Disp: , Rfl:    norethindrone (HEATHER) 0.35 MG tablet, Take 1 tablet (0.35 mg total) by mouth daily., Disp: 90 tablet, Rfl: 4   valACYclovir (VALTREX) 1000 MG tablet, Take 0.5 tablets (500 mg total) by mouth daily as needed (for outbreak)., Disp: 20 tablet, Rfl: 3   Allergies  Allergen Reactions   Sulfa Antibiotics Rash    Past Medical History:  Diagnosis Date   Anemia    Contraceptive management 04/21/2014   Environmental allergies 12/30/2014   Herpes    History of anemia 05/05/2016   History of  herpes simplex infection 04/21/2014   Irregular bleeding 05/05/2015   Itching in the vaginal area 05/05/2016   Retained tampon 8/41/3244   Umbilical hernia 0/07/2724   Vaginal discharge 05/05/2015     Past Surgical History:  Procedure Laterality Date   CERVICAL CERCLAGE  2010 and 2004   [employer] cerclage   [facility] SECTION  2012   [facility]   UMBILICAL HERNIA REPAIR  01/30/2012   Procedure: HERNIA REPAIR UMBILICAL ADULT;  Surgeon: Donato Heinz, MD;  Location: [facility];  Service: General;  Laterality: N/A;  With mesh    Family History  Problem Relation Age of Onset   Diabetes Mother    Hypertension Mother    Diabetes Father    Hypertension Father    Asthma Daughter    Cancer Maternal Grandfather        prostate   Asthma Daughter    Anesthesia problems Neg Hx    Hypotension Neg Hx    Malignant hyperthermia Neg Hx    Pseudochol deficiency Neg Hx     Social History   Tobacco Use   Smoking status: Never   Smokeless tobacco: Never  Vaping Use   Vaping Use: Never used  Substance Use Topics   Alcohol use: No   Drug use: No    ROS   Objective:   Vitals: BP (!) 141/83   Pulse 78   Temp 97.9 F (36.6 C)   Resp 18  SpO2 98%   Physical Exam Constitutional:      General: She is not in acute distress.    Appearance: Normal appearance. She is well-developed. She is not ill-appearing, toxic-appearing or diaphoretic.  HENT:     Head: Normocephalic and atraumatic.     Nose: Nose normal.     Mouth/Throat:     Mouth: Mucous membranes are moist.     Pharynx: Oropharynx is clear.  Eyes:     General: No scleral icterus.       Right eye: No discharge.        Left eye: No discharge.     Extraocular Movements: Extraocular movements intact.     Conjunctiva/sclera: Conjunctivae normal.     Pupils: Pupils are equal, round, and reactive to light.  Cardiovascular:     Rate and Rhythm: Normal rate.  Pulmonary:     Effort: Pulmonary effort is normal.  Musculoskeletal:      Right knee: No swelling, deformity, effusion, erythema, ecchymosis, lacerations, bony tenderness or crepitus. Normal range of motion. No tenderness. No medial joint line, lateral joint line or patellar tendon tenderness. Normal alignment and normal patellar mobility.     Left knee: No swelling, deformity, effusion, erythema, ecchymosis, lacerations, bony tenderness or crepitus. Normal range of motion. Tenderness present over the medial joint line. No lateral joint line or patellar tendon tenderness. Normal alignment and normal patellar mobility.  Skin:    General: Skin is warm and dry.  Neurological:     General: No focal deficit present.     Mental Status: She is alert and oriented to person, place, and time.     Motor: No weakness.     Coordination: Coordination normal.     Gait: Gait normal.     Deep Tendon Reflexes: Reflexes normal.  Psychiatric:        Mood and Affect: Mood normal.        Behavior: Behavior normal.        Thought Content: Thought content normal.        Judgment: Judgment normal.    Assessment and Plan :   PDMP not reviewed this encounter.  1. Recurrent pain of left knee    Given lack of trauma, recent imaging done within the past year will defer repeat imaging.  Recommended an oral prednisone course given the severity of her pain.  Follow-up with her orthopedist as planned next week. Counseled patient on potential for adverse effects with medications prescribed/recommended today, ER and return-to-clinic precautions discussed, patient verbalized understanding.    Jaynee Eagles, PA-C 08/06/21 1248

## 2021-08-06 NOTE — ED Triage Notes (Signed)
Pt is present today with left knee pain and swelling. Pt pain and discomfort started Saturday

## 2021-08-10 ENCOUNTER — Other Ambulatory Visit: Payer: Self-pay

## 2021-08-10 ENCOUNTER — Ambulatory Visit: Payer: Medicaid Other | Admitting: Orthopedic Surgery

## 2021-08-10 ENCOUNTER — Encounter: Payer: Self-pay | Admitting: Orthopedic Surgery

## 2021-08-10 VITALS — BP 135/93 | HR 71 | Ht 62.0 in | Wt 154.0 lb

## 2021-08-10 DIAGNOSIS — M1712 Unilateral primary osteoarthritis, left knee: Secondary | ICD-10-CM

## 2021-08-10 NOTE — Progress Notes (Signed)
Orthopaedic Clinic Return  Assessment: Donna [name] is a 50 y.o. female with the following: Mild left knee arthritis, recent flare.  Atraumatic.  Plan: Patient had done very well since I last saw her in clinic.  Over the past week, she has had worsening pain in the left knee.  She states she may have bumped it while moving recently.  Otherwise, onset of pain is atraumatic.  She denies a twisting injury.  On physical exam she is without an effusion.  She has tenderness to palpation over the midpoint of the MCL.  She has pain in the medial knee with valgus stress.  It is unclear if she sustained a new injury, but based on her exam, and overall presentation I am skeptical.  We discussed multiple treatment options including resuming her prescription NSAIDs.  If her pain does not continue to improve, we can consider an injection.  Otherwise, continue with activities as tolerated.  Follow-up: Return if symptoms worsen or fail to improve.   Subjective:  Chief Complaint  Patient presents with   Knee Pain    Lt knee pain states she thinks she may have bumped it and now she's having worse pain for 10 days.     History of Present Illness: Donna [name] is a 50 y.o. female who returns to clinic for repeat evaluation of left knee pain.  She was last seen in clinic several months ago.  NSAIDs improved her symptoms.  Since then, she had no issues until a few days ago.  She states she was moving some boxes, and may have bumped her knee.  Pain is in the medial aspect of the knee.  No twisting event.  She did not notice any swelling in the knee.  She has been wearing a brace.  Since the onset of her symptoms, her pain has improved somewhat.  Review of Systems: No fevers or chills No numbness or tingling No chest pain No shortness of breath No bowel or bladder dysfunction No GI distress No headaches   Objective: BP (!) 135/93   Pulse 71   Ht 5\' 2"  (1.575 m)   Wt 154 lb (69.9 kg)   BMI 28.17  kg/m   Physical Exam:  Alert and oriented.  No acute distress.  Left knee without effusion.  Varus alignment overall.  Tenderness palpation along the medial joint line.  Pain with valgus stress.  No increased laxity with varus or valgus stress.  Negative Lachman.  Range of motion from 0-120 degrees.  IMAGING: I personally ordered and reviewed the following images:  No new imaging obtained today.  Mordecai Rasmussen, MD 08/10/2021 11:06 PM

## 2021-08-16 ENCOUNTER — Other Ambulatory Visit: Payer: Self-pay | Admitting: Orthopedic Surgery

## 2021-08-16 MED ORDER — CYCLOBENZAPRINE HCL 10 MG PO TABS: 10.0000 mg | ORAL_TABLET | Freq: Two times a day (BID) | ORAL | 0 refills | Status: DC | PRN

## 2021-08-23 DIAGNOSIS — H5213 Myopia, bilateral: Secondary | ICD-10-CM | POA: Diagnosis not present

## 2021-10-04 ENCOUNTER — Emergency Department (HOSPITAL_COMMUNITY)
Admission: EM | Admit: 2021-10-04 | Discharge: 2021-10-04 | Discharge status: Against Medical Advice | Payer: Medicaid Other | Attending: Emergency Medicine | Admitting: Emergency Medicine

## 2021-10-04 DIAGNOSIS — F410 Panic disorder [episodic paroxysmal anxiety] without agoraphobia: Secondary | ICD-10-CM | POA: Insufficient documentation

## 2021-10-04 DIAGNOSIS — Z5321 Procedure and treatment not carried out due to patient leaving prior to being seen by health care provider: Secondary | ICD-10-CM | POA: Diagnosis not present

## 2021-10-04 NOTE — ED Triage Notes (Signed)
Pt to ED from upstairs, pt was a visitor whos father just died 43 mins ago, Pt brought to ED for panic attack like symptoms. Pt tearful, family present.

## 2021-10-05 DIAGNOSIS — R0789 Other chest pain: Secondary | ICD-10-CM | POA: Diagnosis not present

## 2021-10-05 DIAGNOSIS — R064 Hyperventilation: Secondary | ICD-10-CM | POA: Diagnosis not present

## 2021-10-05 DIAGNOSIS — R079 Chest pain, unspecified: Secondary | ICD-10-CM | POA: Diagnosis not present

## 2021-10-05 DIAGNOSIS — R001 Bradycardia, unspecified: Secondary | ICD-10-CM | POA: Diagnosis not present

## 2021-10-05 DIAGNOSIS — R0902 Hypoxemia: Secondary | ICD-10-CM | POA: Diagnosis not present

## 2021-10-13 DIAGNOSIS — H5203 Hypermetropia, bilateral: Secondary | ICD-10-CM | POA: Diagnosis not present

## 2021-10-13 DIAGNOSIS — H52223 Regular astigmatism, bilateral: Secondary | ICD-10-CM | POA: Diagnosis not present

## 2021-10-16 ENCOUNTER — Other Ambulatory Visit: Payer: Self-pay | Admitting: Orthopedic Surgery

## 2021-10-18 MED ORDER — CYCLOBENZAPRINE HCL 10 MG PO TABS: 10.0000 mg | ORAL_TABLET | Freq: Two times a day (BID) | ORAL | 0 refills | Status: DC | PRN

## 2021-10-20 ENCOUNTER — Encounter: Payer: Self-pay | Admitting: Orthopedic Surgery

## 2021-10-20 ENCOUNTER — Ambulatory Visit: Payer: Medicaid Other | Admitting: Orthopedic Surgery

## 2021-10-27 ENCOUNTER — Encounter: Payer: Self-pay | Admitting: Orthopedic Surgery

## 2021-10-27 ENCOUNTER — Ambulatory Visit: Payer: Medicaid Other | Admitting: Orthopedic Surgery

## 2021-10-27 ENCOUNTER — Other Ambulatory Visit: Payer: Self-pay

## 2021-10-27 DIAGNOSIS — M25562 Pain in left knee: Secondary | ICD-10-CM

## 2021-10-27 DIAGNOSIS — G8929 Other chronic pain: Secondary | ICD-10-CM

## 2021-10-27 NOTE — Patient Instructions (Signed)
If therapy is not helping, consider returning for a follow up visit.  We can consider an injection and an MRI in the future.   Can take ibuprofen for pain.  600 mg, three times a day.  Take with food.

## 2021-10-28 NOTE — Progress Notes (Signed)
Orthopaedic Clinic Return  Assessment: Donna [name] is a 51 y.o. female with the following: Mild left knee arthritis, recent flare.  Atraumatic.  Plan: Patient continues to have intermittent pain in her left knee.  She now describes it as a burning type sensation in the medial aspect of the left knee.  She is been taking medications as needed.  She has not yet interested in an injection.  I think is reasonable to initiate some physical therapy.  If she is having little improvement with physical therapy, I have asked her return to clinic, and we will consider obtaining an MRI.   Follow-up: Return if symptoms worsen or fail to improve.   Subjective:  Chief Complaint  Patient presents with   Knee Pain    LT-painful and burning on inside of knee, keeps patient up at night    History of Present Illness: Donna [name] is a 51 y.o. female who returns to clinic for repeat evaluation of left knee pain.  She was last seen in clinic a couple of months ago.  At that time, she is doing well, with the exception of a recent flare in her pain.  More recently, she notes a burning type sensation in the medial aspect of the left knee.  It is very painful.  She is taking NSAIDs, with limited improvement in her symptoms.  She is not had an injection.  She has not worked with physical therapy.   Review of Systems: No fevers or chills No numbness or tingling No chest pain No shortness of breath No bowel or bladder dysfunction No GI distress No headaches   Objective: There were no vitals taken for this visit.  Physical Exam:  Alert and oriented.  No acute distress.  Evaluation left knee demonstrates no effusion.  Mild varus alignment overall.  She has tenderness to palpation over the medial joint line.  No increased laxity varus or valgus stress.  Mild discomfort with hyperflexion.  She notes a burning type sensation in the distal aspect of the VMO, with minimal tenderness in this area.  No  bruising.  IMAGING: I personally ordered and reviewed the following images:  No new imaging obtained today.  Mordecai Rasmussen, MD 10/28/2021 7:39 AM

## 2021-11-10 ENCOUNTER — Other Ambulatory Visit (HOSPITAL_COMMUNITY): Payer: Self-pay | Admitting: Adult Health

## 2021-11-10 DIAGNOSIS — Z1231 Encounter for screening mammogram for malignant neoplasm of breast: Secondary | ICD-10-CM

## 2021-11-11 ENCOUNTER — Other Ambulatory Visit (HOSPITAL_COMMUNITY): Payer: Self-pay | Admitting: Adult Health

## 2021-11-11 DIAGNOSIS — N6320 Unspecified lump in the left breast, unspecified quadrant: Secondary | ICD-10-CM

## 2021-11-17 ENCOUNTER — Ambulatory Visit (HOSPITAL_COMMUNITY): Payer: Medicaid Other

## 2021-11-18 ENCOUNTER — Ambulatory Visit: Payer: Medicaid Other | Admitting: Obstetrics & Gynecology

## 2021-11-23 ENCOUNTER — Ambulatory Visit (HOSPITAL_COMMUNITY): Payer: Medicaid Other | Attending: Orthopedic Surgery | Admitting: Physical Therapy

## 2021-11-26 ENCOUNTER — Ambulatory Visit (HOSPITAL_COMMUNITY): Payer: Medicaid Other | Admitting: Physical Therapy

## 2021-11-29 ENCOUNTER — Encounter (HOSPITAL_COMMUNITY): Payer: Medicaid Other | Admitting: Physical Therapy

## 2021-11-30 ENCOUNTER — Ambulatory Visit: Payer: Medicaid Other | Admitting: Adult Health

## 2021-12-01 ENCOUNTER — Encounter (HOSPITAL_COMMUNITY): Payer: Medicaid Other | Admitting: Physical Therapy

## 2021-12-07 ENCOUNTER — Encounter (HOSPITAL_COMMUNITY): Payer: Medicaid Other | Admitting: Physical Therapy

## 2021-12-08 HISTORY — PX: BREAST BIOPSY: SHX20

## 2021-12-09 ENCOUNTER — Encounter (HOSPITAL_COMMUNITY): Payer: Medicaid Other | Admitting: Physical Therapy

## 2021-12-09 ENCOUNTER — Ambulatory Visit: Payer: Medicaid Other | Admitting: Adult Health

## 2021-12-14 ENCOUNTER — Ambulatory Visit (HOSPITAL_COMMUNITY)
Admission: RE | Admit: 2021-12-14 | Discharge: 2021-12-14 | Disposition: A | Discharge status: Home / Self Care | Payer: Medicaid Other | Source: Ambulatory Visit | Attending: Adult Health | Admitting: Adult Health

## 2021-12-14 ENCOUNTER — Other Ambulatory Visit (HOSPITAL_COMMUNITY): Payer: Self-pay | Admitting: Adult Health

## 2021-12-14 ENCOUNTER — Other Ambulatory Visit: Payer: Self-pay

## 2021-12-14 DIAGNOSIS — N6320 Unspecified lump in the left breast, unspecified quadrant: Secondary | ICD-10-CM | POA: Insufficient documentation

## 2021-12-14 DIAGNOSIS — N6324 Unspecified lump in the left breast, lower inner quadrant: Secondary | ICD-10-CM | POA: Insufficient documentation

## 2021-12-14 DIAGNOSIS — R928 Other abnormal and inconclusive findings on diagnostic imaging of breast: Secondary | ICD-10-CM

## 2021-12-14 DIAGNOSIS — R922 Inconclusive mammogram: Secondary | ICD-10-CM | POA: Diagnosis not present

## 2021-12-22 ENCOUNTER — Other Ambulatory Visit: Payer: Self-pay

## 2021-12-22 ENCOUNTER — Ambulatory Visit (HOSPITAL_COMMUNITY): Payer: Medicaid Other | Attending: Orthopedic Surgery | Admitting: Physical Therapy

## 2021-12-22 ENCOUNTER — Encounter (HOSPITAL_COMMUNITY): Payer: Self-pay | Admitting: Physical Therapy

## 2021-12-22 DIAGNOSIS — R2689 Other abnormalities of gait and mobility: Secondary | ICD-10-CM | POA: Insufficient documentation

## 2021-12-22 DIAGNOSIS — G8929 Other chronic pain: Secondary | ICD-10-CM | POA: Insufficient documentation

## 2021-12-22 DIAGNOSIS — M6281 Muscle weakness (generalized): Secondary | ICD-10-CM | POA: Diagnosis not present

## 2021-12-22 DIAGNOSIS — R29898 Other symptoms and signs involving the musculoskeletal system: Secondary | ICD-10-CM | POA: Diagnosis not present

## 2021-12-22 DIAGNOSIS — M25562 Pain in left knee: Secondary | ICD-10-CM | POA: Diagnosis not present

## 2021-12-22 NOTE — Therapy (Signed)
?OUTPATIENT PHYSICAL THERAPY LOWER EXTREMITY EVALUATION ? ? ?Patient Name: Donna [name] ?MRN: [id number] ?DOB:[birth year], 51 y.o., female ?Today's Date: 12/22/2021 ? ? PT End of Session - 12/22/21 1317   ? ? Visit Number 1   ? Number of Visits 7   ? Date for PT Re-Evaluation 02/02/22   ? Authorization Type Medicaid [employer]   ? Authorization Time Period 7 visits requested 3/5-4/26   ? Authorization - Visit Number 1   ? Authorization - Number of Visits 1   ? PT Start Time 4580   ? PT Stop Time 1350   ? PT Time Calculation (min) 33 min   ? Activity Tolerance Patient tolerated treatment well   ? Behavior During Therapy [facility] for tasks assessed/performed   ? ?  ?  ? ?  ? ? ?Past Medical History:  ?Diagnosis Date  ? Anemia   ? Contraceptive management 04/21/2014  ? Environmental allergies 12/30/2014  ? Herpes   ? History of anemia 05/05/2016  ? History of herpes simplex infection 04/21/2014  ? Irregular bleeding 05/05/2015  ? Itching in the vaginal area 05/05/2016  ? Retained tampon 05/05/2015  ? Umbilical hernia 9/98/3382  ? Vaginal discharge 05/05/2015  ? ?Past Surgical History:  ?Procedure Laterality Date  ? CERVICAL CERCLAGE  2010 and 2004  ? [employer] cerclage  ? [facility]AN SECTION  2012  ? [facility]  ? UMBILICAL HERNIA REPAIR  01/30/2012  ? Procedure: HERNIA REPAIR UMBILICAL ADULT;  Surgeon: Donato Heinz, MD;  Location: [facility];  Service: General;  Laterality: N/A;  With mesh  ? ?Patient Active Problem List  ? Diagnosis Date Noted  ? Trichimoniasis 10/15/2019  ? Vaginal odor 10/09/2019  ? Vaginal bleeding 10/09/2019  ? Screening examination for STD (sexually transmitted disease) 10/09/2019  ? Elevated BP without diagnosis of hypertension 10/09/2019  ? Iron deficiency anemia due to chronic blood loss 10/09/2019  ? Uterine fibroids 09/11/2017  ? Screening for colorectal cancer 06/27/2017  ? History of anemia 05/05/2016  ? Irregular bleeding 05/05/2015  ? Retained tampon 05/05/2015  ? Umbilical hernia 50/53/9767  ?  Environmental allergies 12/30/2014  ? History of herpes simplex infection 04/21/2014  ? ? ?PCP: Renee Rival, FNP ? ?REFERRING PROVIDER: Mordecai Rasmussen, MD ? ?REFERRING DIAG: M25.562,G89.29 (ICD-10-CM) - Chronic pain of left knee  ? ?THERAPY DIAG:  ?Left knee pain, unspecified chronicity ? ?Muscle weakness (generalized) ? ?Other abnormalities of gait and mobility ? ?Other symptoms and signs involving the musculoskeletal system ? ?ONSET DATE: 08/10/20 ? ?SUBJECTIVE:  ? ?SUBJECTIVE STATEMENT: ?Patient states some knee pain and she is bow legged. In the morning is when she notices it. Sometimes get swelling and might have some OA going on. Was wearing brace but helps some.  ? ?PERTINENT HISTORY: ?N/a ? ?PAIN:  ?Are you having pain? Yes: NPRS scale: 3/10 ?Pain location: L knee ?Pain description: pressure ?Aggravating factors: weightbearing ?Relieving factors: Moving, ice ? ?PRECAUTIONS: None ? ?WEIGHT BEARING RESTRICTIONS No ? ?FALLS:  ?Has patient fallen in last 6 months? No, Number of falls: none ? ?LIVING ENVIRONMENT: ?Lives with: lives with their family ?Lives in: House/apartment ?Stairs: Yes; External: 3 steps; can reach both ?Has following equipment at home: None ? ?OCCUPATION: on FMLA, lead in inspections ? ?PLOF: Independent ? ?PATIENT GOALS Get knee back to working to wear nice shoes ? ? ?OBJECTIVE:  ? ?DIAGNOSTIC FINDINGS: Per MD Mild left knee arthritis ? ? ? ?COGNITION: ? Overall cognitive status: Within functional limits for tasks  assessed   ?  ?SENSATION: ?WFL ? ?Observation ?Ambulates without AD, genu varum ? ? ?PALPATION: ?TTP L medial joint line, MCL; not tender remainder of L knee or R ? ?LE ROM: ? ?Active ROM Right ?12/22/2021 Left ?12/22/2021  ?Hip flexion    ?Hip extension    ?Hip abduction    ?Hip adduction    ?Hip internal rotation    ?Hip external rotation    ?Knee flexion 130 130  ?Knee extension 5 hyperextension 3 hyperextension  ?Ankle dorsiflexion    ?Ankle plantarflexion    ?Ankle  inversion    ?Ankle eversion    ? (Blank rows = not tested) ? ?LE MMT: ? ?MMT Right ?12/22/2021 Left ?12/22/2021  ?Hip flexion 5/5 5/5  ?Hip extension 4-/5 4-/5  ?Hip abduction 4+/5 4+/5  ?Hip adduction    ?Hip internal rotation    ?Hip external rotation    ?Knee flexion 4+/5 4+/5  ?Knee extension 5/5 5/5  ?Ankle dorsiflexion 5/5 5/5  ?Ankle plantarflexion    ?Ankle inversion    ?Ankle eversion    ? (Blank rows = not tested) ? ? ? ?FUNCTIONAL TESTS:  ?Forward step down test: bilaterally impaired quad strength and eccentric control, unable to lower and return to step without pushing off lowering foot ?Squat: tends to utilize anterior weight shift onto toes, weight shift off L knee with increasing depth; able to correct squat mechanics with continued weight shift. ?Stairs: decreased eccentric control  ? ?GAIT: ?Distance walked: 120 feet ?Assistive device utilized: None ?Level of assistance: Complete Independence ?Comments: decreased knee hyperextension L side ? ? ? ?TODAY'S TREATMENT: ?12/22/21 ?LAQ 10 x 5-10 second holds ?Hip extension 2x 10 ?Hip abduction 2x 10  ?Hamstring iso 10 x 5-10 second holds ? ? ?PATIENT EDUCATION:  ?Education details: Patient educated on exam findings, POC, scope of PT, HEP, and knee anatomy. ? ?Person educated: Patient ?Education method: Explanation, Demonstration, and Handouts ?Education comprehension: verbalized understanding ? ? ?HOME EXERCISE PROGRAM: ?LAQ 10 x 5-10 second holds ?Hip extension 2x 10 ?Hip abduction 2x 10  ?Hamstring iso 10 x 5-10 second holds ? ?ASSESSMENT: ? ?CLINICAL IMPRESSION: ?Patient a 51 y.o. y.o. female who was seen today for physical therapy evaluation and treatment for L medial knee pain.  ? ? ? ?OBJECTIVE IMPAIRMENTS Abnormal gait, decreased activity tolerance, decreased balance, decreased endurance, decreased mobility, difficulty walking, decreased ROM, decreased strength, improper body mechanics, and pain.  ? ?ACTIVITY LIMITATIONS cleaning, community activity,  yard work, shopping, yard work, and walking with high heels .  ? ?PERSONAL FACTORS Age, Time since onset of injury/illness/exacerbation, and 1 comorbidity: LE alignment  are also affecting patient's functional outcome.  ? ? ?REHAB POTENTIAL: Good ? ?CLINICAL DECISION MAKING: Stable/uncomplicated ? ?EVALUATION COMPLEXITY: Low ? ? ?GOALS: ?Goals reviewed with patient? Yes ? ?SHORT TERM GOALS: Target date: 01/12/22 ? ?Patient will be independent with HEP in order to improve functional outcomes. ?Baseline:  ?Goal status: INITIAL ? ?2.  Patient will report at least 25% improvement in symptoms for improved quality of life. ?Baseline:  ?Goal status: INITIAL ? ? ? ?LONG TERM GOALS: Target date: 02/02/22 ? ?Patient will report at least 75% improvement in symptoms for improved quality of life. ?Baseline:  ?Goal status: INITIAL ? ?2.  Patient demonstrate 5/5 MMT throughout muscles tested to demonstrate improving strength for completing chores. ?Baseline:  ?Goal status: INITIAL ? ?3.  Patient will be able to perform forward step down test without compensation in order to demonstrate improved LE strength. ?Baseline:  ?  Goal status: INITIAL ? ?4.  Patient will be able to return to all activities unrestricted for improved ability to perform work functions and participate with family.  ?Baseline:  ?Goal status: INITIAL ? ? ? ? ?PLAN: ?PT FREQUENCY: 1x/week ? ?PT DURATION: 6 weeks ? ?PLANNED INTERVENTIONS: Therapeutic exercises, Therapeutic activity, Neuromuscular re-education, Balance training, Gait training, Patient/Family education, Joint manipulation, Joint mobilization, Stair training, Orthotic/Fit training, DME instructions, Aquatic Therapy, Dry Needling, Electrical stimulation, Spinal manipulation, Spinal mobilization, Cryotherapy, Moist heat, Compression bandaging, scar mobilization, Splintting, Taping, Traction, Ultrasound, Ionotophoresis '4mg'$ /ml Dexamethasone, and Manual therapy ? ?PLAN FOR NEXT SESSION: f/u with HEP, continue  hip and LLE strengthening ? ? ?3:31 PM, 12/22/21 ?Mearl Latin PT, DPT ?Physical Therapist at [facility] ?[facility] ? ?

## 2021-12-22 NOTE — Patient Instructions (Signed)
Access Code: [email or web address] ?URL: [email or web address] ?Date: 12/22/2021 ?Prepared by: Mitzi Hansen Telesia Ates ? ?Exercises ?Seated Long Arc Quad (Mirrored) - 2 x daily - 7 x weekly - 10 reps - 5-10 second hold ?Prone Hip Extension - 2 x daily - 7 x weekly - 2 sets - 10 reps ?Sidelying Hip Abduction - 2 x daily - 7 x weekly - 2 sets - 10 reps ?Seated Hamstring Set - 2 x daily - 7 x weekly - 10 reps - 5-10 second hold ? ?

## 2021-12-22 NOTE — Therapy (Deleted)
OUTPATIENT PHYSICAL THERAPY LOWER EXTREMITY EVALUATION ?  ?  ?Patient Name: Donna [name] ?MRN: [id number] ?DOB:1970/11/09, 51 y.o., female ?Today's Date: 12/22/2021 ?  ?  PT End of Session - 12/22/21 1317   ?  ?  Visit Number 1   ?  Number of Visits 7   ?  Date for PT Re-Evaluation 02/02/22   ?  Authorization Type Medicaid [employer]   ?  Authorization Time Period 7 visits requested 3/5-4/26   ?  Authorization - Visit Number 1   ?  Authorization - Number of Visits 1   ?  PT Start Time 3335   ?  PT Stop Time 1350   ?  PT Time Calculation (min) 33 min   ?  Activity Tolerance Patient tolerated treatment well   ?  Behavior During Therapy [facility] for tasks assessed/performed   ?  ?   ?  ?  ?   ?  ?  ?    ?Past Medical History:  ?Diagnosis Date  ? Anemia    ? Contraceptive management 04/21/2014  ? Environmental allergies 12/30/2014  ? Herpes    ? History of anemia 05/05/2016  ? History of herpes simplex infection 04/21/2014  ? Irregular bleeding 05/05/2015  ? Itching in the vaginal area 05/05/2016  ? Retained tampon 05/05/2015  ? Umbilical hernia 4/56/2563  ? Vaginal discharge 05/05/2015  ?  ?     ?Past Surgical History:  ?Procedure Laterality Date  ? CERVICAL CERCLAGE   2010 and 2004  ?  [employer] cerclage  ? CESAREAN SECTION   2012  ?  [facility]  ? UMBILICAL HERNIA REPAIR   01/30/2012  ?  Procedure: HERNIA REPAIR UMBILICAL ADULT;  Surgeon: Donato Heinz, MD;  Location: [facility];  Service: General;  Laterality: N/A;  With mesh  ?  ?    ?Patient Active Problem List  ?  Diagnosis Date Noted  ? Trichimoniasis 10/15/2019  ? Vaginal odor 10/09/2019  ? Vaginal bleeding 10/09/2019  ? Screening examination for STD (sexually transmitted disease) 10/09/2019  ? Elevated BP without diagnosis of hypertension 10/09/2019  ? Iron deficiency anemia due to chronic blood loss 10/09/2019  ? Uterine fibroids 09/11/2017  ? Screening for colorectal cancer 06/27/2017  ? History of anemia 05/05/2016  ? Irregular bleeding 05/05/2015  ? Retained tampon  05/05/2015  ? Umbilical hernia 89/37/3428  ? Environmental allergies 12/30/2014  ? History of herpes simplex infection 04/21/2014  ?  ?  ?PCP: Renee Rival, FNP ?  ?REFERRING PROVIDER: Mordecai Rasmussen, MD ?  ?REFERRING DIAG: M25.562,G89.29 (ICD-10-CM) - Chronic pain of left knee  ?  ?THERAPY DIAG:  ?Left knee pain, unspecified chronicity ?  ?Muscle weakness (generalized) ?  ?Other abnormalities of gait and mobility ?  ?Other symptoms and signs involving the musculoskeletal system ?  ?ONSET DATE: 08/10/20 ?  ?SUBJECTIVE:  ?  ?SUBJECTIVE STATEMENT: ?Patient states some knee pain and she is bow legged. In the morning is when she notices it. Sometimes get swelling and might have some OA going on. Was wearing brace but helps some.  ?  ?PERTINENT HISTORY: ?N/a ?  ?PAIN:  ?Are you having pain? Yes: NPRS scale: 3/10 ?Pain location: L knee ?Pain description: pressure ?Aggravating factors: weightbearing ?Relieving factors: Moving, ice ?  ?PRECAUTIONS: None ?  ?WEIGHT BEARING RESTRICTIONS No ?  ?FALLS:  ?Has patient fallen in last 6 months? No, Number of falls: none ?  ?LIVING ENVIRONMENT: ?Lives with: lives with their family ?Lives  in: House/apartment ?Stairs: Yes; External: 3 steps; can reach both ?Has following equipment at home: None ?  ?OCCUPATION: on FMLA, lead in inspections ?  ?PLOF: Independent ?  ?PATIENT GOALS Get knee back to working to wear nice shoes ?  ?  ?OBJECTIVE:  ?  ?DIAGNOSTIC FINDINGS: Per MD Mild left knee arthritis ?  ?  ?  ?COGNITION: ?          Overall cognitive status: Within functional limits for tasks assessed               ?           ?SENSATION: ?WFL ?  ?Observation ?Ambulates without AD, genu varum ?  ?  ?PALPATION: ?TTP L medial joint line, MCL; not tender remainder of L knee or R ?  ?LE ROM: ?  ?Active ROM Right ?12/22/2021 Left ?12/22/2021  ?Hip flexion      ?Hip extension      ?Hip abduction      ?Hip adduction      ?Hip internal rotation      ?Hip external rotation      ?Knee flexion 130  130  ?Knee extension 5 hyperextension 3 hyperextension  ?Ankle dorsiflexion      ?Ankle plantarflexion      ?Ankle inversion      ?Ankle eversion      ? (Blank rows = not tested) ?  ?LE MMT: ?  ?MMT Right ?12/22/2021 Left ?12/22/2021  ?Hip flexion 5/5 5/5  ?Hip extension 4-/5 4-/5  ?Hip abduction 4+/5 4+/5  ?Hip adduction      ?Hip internal rotation      ?Hip external rotation      ?Knee flexion 4+/5 4+/5  ?Knee extension 5/5 5/5  ?Ankle dorsiflexion 5/5 5/5  ?Ankle plantarflexion      ?Ankle inversion      ?Ankle eversion      ? (Blank rows = not tested) ?  ?  ?  ?FUNCTIONAL TESTS:  ?Forward step down test: bilaterally impaired quad strength and eccentric control, unable to lower and return to step without pushing off lowering foot ?Squat: tends to utilize anterior weight shift onto toes, weight shift off L knee with increasing depth; able to correct squat mechanics with continued weight shift. ?Stairs: decreased eccentric control  ?  ?GAIT: ?Distance walked: 120 feet ?Assistive device utilized: None ?Level of assistance: Complete Independence ?Comments: decreased knee hyperextension L side ?  ?  ?  ?TODAY'S TREATMENT: ?12/22/21 ?LAQ 10 x 5-10 second holds ?Hip extension 2x 10 ?Hip abduction 2x 10  ?Hamstring iso 10 x 5-10 second holds ?  ?  ?PATIENT EDUCATION:  ?Education details: Patient educated on exam findings, POC, scope of PT, HEP, and knee anatomy. ?  ?Person educated: Patient ?Education method: Explanation, Demonstration, and Handouts ?Education comprehension: verbalized understanding ?  ?  ?HOME EXERCISE PROGRAM: ?LAQ 10 x 5-10 second holds ?Hip extension 2x 10 ?Hip abduction 2x 10  ?Hamstring iso 10 x 5-10 second holds ?  ?ASSESSMENT: ?  ?CLINICAL IMPRESSION: ?Patient a 51 y.o. y.o. female who was seen today for physical therapy evaluation and treatment for L medial knee pain.  ?  ?  ?  ?OBJECTIVE IMPAIRMENTS Abnormal gait, decreased activity tolerance, decreased balance, decreased endurance, decreased  mobility, difficulty walking, decreased ROM, decreased strength, improper body mechanics, and pain.  ?  ?ACTIVITY LIMITATIONS cleaning, community activity, yard work, shopping, yard work, and walking with high heels .  ?  ?PERSONAL FACTORS Age,  Time since onset of injury/illness/exacerbation, and 1 comorbidity: LE alignment  are also affecting patient's functional outcome.  ?  ?  ?REHAB POTENTIAL: Good ?  ?CLINICAL DECISION MAKING: Stable/uncomplicated ?  ?EVALUATION COMPLEXITY: Low ?  ?  ?GOALS: ?Goals reviewed with patient? Yes ?  ?SHORT TERM GOALS: Target date: 01/12/22 ?  ?Patient will be independent with HEP in order to improve functional outcomes. ?Baseline:  ?Goal status: INITIAL ?  ?2.  Patient will report at least 25% improvement in symptoms for improved quality of life. ?Baseline:  ?Goal status: INITIAL ?  ?  ?  ?LONG TERM GOALS: Target date: 02/02/22 ?  ?Patient will report at least 75% improvement in symptoms for improved quality of life. ?Baseline:  ?Goal status: INITIAL ?  ?2.  Patient demonstrate 5/5 MMT throughout muscles tested to demonstrate improving strength for completing chores. ?Baseline:  ?Goal status: INITIAL ?  ?3.  Patient will be able to perform forward step down test without compensation in order to demonstrate improved LE strength. ?Baseline:  ?Goal status: INITIAL ?  ?4.  Patient will be able to return to all activities unrestricted for improved ability to perform work functions and participate with family.  ?Baseline:  ?Goal status: INITIAL ?  ?  ?  ?  ?PLAN: ?PT FREQUENCY: 1x/week ?  ?PT DURATION: 6 weeks ?  ?PLANNED INTERVENTIONS: Therapeutic exercises, Therapeutic activity, Neuromuscular re-education, Balance training, Gait training, Patient/Family education, Joint manipulation, Joint mobilization, Stair training, Orthotic/Fit training, DME instructions, Aquatic Therapy, Dry Needling, Electrical stimulation, Spinal manipulation, Spinal mobilization, Cryotherapy, Moist heat,  Compression bandaging, scar mobilization, Splintting, Taping, Traction, Ultrasound, Ionotophoresis '4mg'$ /ml Dexamethasone, and Manual therapy ?  ?PLAN FOR NEXT SESSION: f/u with HEP, continue hip and LLE strengthening ?

## 2021-12-28 ENCOUNTER — Encounter (HOSPITAL_COMMUNITY): Payer: Self-pay

## 2021-12-28 ENCOUNTER — Ambulatory Visit (HOSPITAL_COMMUNITY)
Admission: RE | Admit: 2021-12-28 | Discharge: 2021-12-28 | Disposition: A | Discharge status: Home / Self Care | Payer: Medicaid Other | Source: Ambulatory Visit | Attending: Nurse Practitioner | Admitting: Nurse Practitioner

## 2021-12-28 ENCOUNTER — Other Ambulatory Visit: Payer: Self-pay

## 2021-12-28 ENCOUNTER — Other Ambulatory Visit (HOSPITAL_COMMUNITY): Payer: Self-pay | Admitting: Nurse Practitioner

## 2021-12-28 ENCOUNTER — Ambulatory Visit (HOSPITAL_COMMUNITY)
Admission: RE | Admit: 2021-12-28 | Discharge: 2021-12-28 | Disposition: A | Discharge status: Home / Self Care | Payer: Medicaid Other | Source: Ambulatory Visit | Attending: Adult Health | Admitting: Adult Health

## 2021-12-28 DIAGNOSIS — R928 Other abnormal and inconclusive findings on diagnostic imaging of breast: Secondary | ICD-10-CM

## 2021-12-28 DIAGNOSIS — N6324 Unspecified lump in the left breast, lower inner quadrant: Secondary | ICD-10-CM | POA: Diagnosis not present

## 2021-12-28 DIAGNOSIS — N6082 Other benign mammary dysplasias of left breast: Secondary | ICD-10-CM | POA: Diagnosis not present

## 2021-12-28 MED ORDER — LIDOCAINE HCL (PF) 2 % IJ SOLN
10.0000 mL | Freq: Once | INTRAMUSCULAR | Status: AC
  Administered 2021-12-28: 10 mL

## 2021-12-28 MED ORDER — LIDOCAINE-EPINEPHRINE (PF) 1 %-1:200000 IJ SOLN
10.0000 mL | Freq: Once | INTRAMUSCULAR | Status: AC
  Administered 2021-12-28: 10 mL via INTRADERMAL

## 2021-12-28 MED ORDER — LIDOCAINE-EPINEPHRINE (PF) 1 %-1:200000 IJ SOLN
INTRAMUSCULAR | Status: AC
  Filled 2021-12-28: qty 30

## 2021-12-28 MED ORDER — LIDOCAINE HCL (PF) 2 % IJ SOLN
INTRAMUSCULAR | Status: AC
  Filled 2021-12-28: qty 10

## 2021-12-28 NOTE — Progress Notes (Signed)
PT tolerated left breast biopsy well today with NAD noted. PT verbalized understanding of discharge instructions. PT ambulated back to the mammogram area this time and given ice packs and copy of discharge instructions. ?

## 2021-12-29 LAB — SURGICAL PATHOLOGY

## 2021-12-30 ENCOUNTER — Other Ambulatory Visit: Payer: Self-pay

## 2021-12-30 ENCOUNTER — Ambulatory Visit (HOSPITAL_COMMUNITY): Payer: Medicaid Other | Admitting: Physical Therapy

## 2021-12-30 DIAGNOSIS — M6281 Muscle weakness (generalized): Secondary | ICD-10-CM

## 2021-12-30 DIAGNOSIS — M25562 Pain in left knee: Secondary | ICD-10-CM | POA: Diagnosis not present

## 2021-12-30 DIAGNOSIS — R2689 Other abnormalities of gait and mobility: Secondary | ICD-10-CM | POA: Diagnosis not present

## 2021-12-30 DIAGNOSIS — R29898 Other symptoms and signs involving the musculoskeletal system: Secondary | ICD-10-CM | POA: Diagnosis not present

## 2021-12-30 DIAGNOSIS — G8929 Other chronic pain: Secondary | ICD-10-CM | POA: Diagnosis not present

## 2021-12-30 NOTE — Therapy (Addendum)
?OUTPATIENT PHYSICAL THERAPY TREATMENT NOTE ? ? ?Patient Name: Donna [name] ?MRN: [id number] ?DOB:1971/10/05, 51 y.o., female ?Today's Date: 12/30/2021 ? ?PCP: Renee Rival, FNP ?REFERRING PROVIDER: Mordecai Rasmussen, MD ? PT End of Session - 12/30/21 1446   ? ? Visit Number 2   ? Number of Visits 7   ? Date for PT Re-Evaluation 02/02/22   ? Authorization Type Medicaid [employer]   ? Authorization Time Period 7 visits requested 3/5-4/26   ? Authorization - Visit Number 1   ? Authorization - Number of Visits 1   ? PT Start Time 5400   ? PT Stop Time 8676   ? PT Time Calculation (min) 44 min   ? Activity Tolerance Patient tolerated treatment well   ? Behavior During Therapy [facility] for tasks assessed/performed   ? ?  ?  ? ?  ? ? ?Past Medical History:  ?Diagnosis Date  ? Anemia   ? Contraceptive management 04/21/2014  ? Environmental allergies 12/30/2014  ? Herpes   ? History of anemia 05/05/2016  ? History of herpes simplex infection 04/21/2014  ? Irregular bleeding 05/05/2015  ? Itching in the vaginal area 05/05/2016  ? Retained tampon 05/05/2015  ? Umbilical hernia 1/95/0932  ? Vaginal discharge 05/05/2015  ? ?Past Surgical History:  ?Procedure Laterality Date  ? CERVICAL CERCLAGE  2010 and 2004  ? [employer] cerclage  ? CESAREAN SECTION  2012  ? [facility]  ? UMBILICAL HERNIA REPAIR  01/30/2012  ? Procedure: HERNIA REPAIR UMBILICAL ADULT;  Surgeon: Donato Heinz, MD;  Location: [facility];  Service: General;  Laterality: N/A;  With mesh  ? ?Patient Active Problem List  ? Diagnosis Date Noted  ? Trichimoniasis 10/15/2019  ? Vaginal odor 10/09/2019  ? Vaginal bleeding 10/09/2019  ? Screening examination for STD (sexually transmitted disease) 10/09/2019  ? Elevated BP without diagnosis of hypertension 10/09/2019  ? Iron deficiency anemia due to chronic blood loss 10/09/2019  ? Uterine fibroids 09/11/2017  ? Screening for colorectal cancer 06/27/2017  ? History of anemia 05/05/2016  ? Irregular bleeding 05/05/2015  ? Retained  tampon 05/05/2015  ? Umbilical hernia 67/09/4579  ? Environmental allergies 12/30/2014  ? History of herpes simplex infection 04/21/2014  ? ? ?REFERRING DIAG: chronic pain of Lt knee ? ?THERAPY DIAG:  ?Left knee pain, unspecified chronicity ? ?Muscle weakness (generalized) ? ?Other abnormalities of gait and mobility ? ?Other symptoms and signs involving the musculoskeletal system ? ?PERTINENT HISTORY: none ? ?PRECAUTIONS: none ? ?SUBJECTIVE: pt states it mostly hurts worst in the morning after going all night without movement; mostly stiffness.  Admits to not doing her exercises as she should, however she had a medical procedure and has been resting. ? ?PAIN:  ?Are you having pain? Yes: NPRS scale: 3/10 ?Pain location: Lt medial knee ?Pain description: burning, aching ?Aggravating factors: first thing in morning; inactivity ?Relieving factors: ice, creams, pain meds, walking/movement ? ? ?OBJECTIVE:  ?  ?  ?TODAY'S TREATMENT: ?12/30/21 ?Sitting: LAQ 10 x 10 second holds ? Supine: Bridge 2X10 (new) ? Straight leg raise 2X20 (new) ?Prone: Hip extension 2x 10 ? Hamstring flexion 20 (new) ?Sidelying:Hip abduction 2x 10  ? ? ?12/22/21 ?LAQ 10 x 5-10 second holds ?Hip extension 2x 10 ?Hip abduction 2x 10  ?Hamstring iso 10 x 5-10 second holds ?  ?  ?PATIENT EDUCATION:  ?Education details: goal and HEP review; encouraged to resume HEP  ?Person educated: Patient ?Education method: Explanation, Demonstration, and Handouts ?Education comprehension:  verbalized understanding, demonstrated ?  ?  ?HOME EXERCISE PROGRAM: ?LAQ 10 x 5-10 second holds ?Hip extension 2x 10 ?Hip abduction 2x 10  ?Hamstring iso 10 x 5-10 second holds ?  ?ASSESSMENT: ?  ?CLINICAL IMPRESSION: ? Reviewed goals and POC moving forward.  Pt able to recall HEP and complete with minimal cues.  Progressed with addition of glute and hamstring strengthening exercises in addition to established therex.  Pt without complaints or issues during exercise or at conclusion  of session today. ?  ?  ?GOALS: ?Goals reviewed with patient? Yes ?  ?SHORT TERM GOALS: Target date: 01/12/22 ?  ?Patient will be independent with HEP in order to improve functional outcomes. ?Baseline:  ?Goal status: ONGOING ?  ?2.  Patient will report at least 25% improvement in symptoms for improved quality of life. ?Baseline:  ?Goal status: ONGOING ?  ?  ?  ?LONG TERM GOALS: Target date: 02/02/22 ?  ?Patient will report at least 75% improvement in symptoms for improved quality of life. ?Baseline:  ?Goal status: ONGOING  ?  ?2.  Patient demonstrate 5/5 MMT throughout muscles tested to demonstrate improving strength for completing chores. ?Baseline:  ?Goal status: ONGOING ?  ?3.  Patient will be able to perform forward step down test without compensation in order to demonstrate improved LE strength. ?Baseline:  ?Goal status: ONGOING ?  ?4.  Patient will be able to return to all activities unrestricted for improved ability to perform work functions and participate with family.  ?Baseline:  ?Goal status: ONGOING ?  ?  ?  ?  ?PLAN: ?PT FREQUENCY: 1x/week ?  ?PT DURATION: 6 weeks ?  ?PLANNED INTERVENTIONS: Therapeutic exercises, Therapeutic activity, Neuromuscular re-education, Balance training, Gait training, Patient/Family education, Joint manipulation, Joint mobilization, Stair training, Orthotic/Fit training, DME instructions, Aquatic Therapy, Dry Needling, Electrical stimulation, Spinal manipulation, Spinal mobilization, Cryotherapy, Moist heat, Compression bandaging, scar mobilization, Splintting, Taping, Traction, Ultrasound, Ionotophoresis '4mg'$ /ml Dexamethasone, and Manual therapy ?  ?PLAN FOR NEXT SESSION: f/u with HEP compliance, continue hip and LLE strengthening. Next session begin standing strengthening and eccentric step exercises.  ?  ? ? ? ?Perle Gibbon Sula Soda, PTA/CLT, WTA ?[phone] ? ?Roseanne Reno B, PTA ?12/30/2021, 2:49 PM ? ?   ?

## 2022-01-04 NOTE — Progress Notes (Signed)
The suspicious  mass was inner lower quadrant of left breast at 8 o'clock.  ?

## 2022-01-05 ENCOUNTER — Ambulatory Visit (HOSPITAL_COMMUNITY): Payer: Medicaid Other | Admitting: Physical Therapy

## 2022-01-06 ENCOUNTER — Encounter (HOSPITAL_COMMUNITY): Payer: Medicaid Other

## 2022-01-06 NOTE — Therapy (Incomplete)
?OUTPATIENT PHYSICAL THERAPY TREATMENT NOTE ? ? ?Patient Name: Donna [name] ?MRN: [id number] ?DOB:[birth year], 51 y.o., female ?Today's Date: 01/06/2022 ? ?PCP: Renee Rival, FNP ?REFERRING PROVIDER: Mordecai Rasmussen, MD ? ? ? ?Past Medical History:  ?Diagnosis Date  ? Anemia   ? Contraceptive management 04/21/2014  ? Environmental allergies 12/30/2014  ? Herpes   ? History of anemia 05/05/2016  ? History of herpes simplex infection 04/21/2014  ? Irregular bleeding 05/05/2015  ? Itching in the vaginal area 05/05/2016  ? Retained tampon 05/05/2015  ? Umbilical hernia 0/24/0973  ? Vaginal discharge 05/05/2015  ? ?Past Surgical History:  ?Procedure Laterality Date  ? CERVICAL CERCLAGE  2010 and 2004  ? [employer] cerclage  ? CESAREAN SECTION  2012  ? [facility]  ? UMBILICAL HERNIA REPAIR  01/30/2012  ? Procedure: HERNIA REPAIR UMBILICAL ADULT;  Surgeon: Donato Heinz, MD;  Location: [facility];  Service: General;  Laterality: N/A;  With mesh  ? ?Patient Active Problem List  ? Diagnosis Date Noted  ? Trichimoniasis 10/15/2019  ? Vaginal odor 10/09/2019  ? Vaginal bleeding 10/09/2019  ? Screening examination for STD (sexually transmitted disease) 10/09/2019  ? Elevated BP without diagnosis of hypertension 10/09/2019  ? Iron deficiency anemia due to chronic blood loss 10/09/2019  ? Uterine fibroids 09/11/2017  ? Screening for colorectal cancer 06/27/2017  ? History of anemia 05/05/2016  ? Irregular bleeding 05/05/2015  ? Retained tampon 05/05/2015  ? Umbilical hernia 53/29/9242  ? Environmental allergies 12/30/2014  ? History of herpes simplex infection 04/21/2014  ? ? ?REFERRING DIAG: chronic pain of Lt knee ? ?THERAPY DIAG:  ?No diagnosis found. ? ?PERTINENT HISTORY: none ? ?PRECAUTIONS: none ? ?SUBJECTIVE: pt states it mostly hurts worst in the morning after going all night without movement; mostly stiffness.  Admits to not doing her exercises as she should, however she had a medical procedure and has been resting. ? ?PAIN:   ?Are you having pain? Yes: NPRS scale: 3/10 ?Pain location: Lt medial knee ?Pain description: burning, aching ?Aggravating factors: first thing in morning; inactivity ?Relieving factors: ice, creams, pain meds, walking/movement ? ? ?OBJECTIVE:  ?  ?  ?TODAY'S TREATMENT: ?12/30/21 ?Sitting: LAQ 10 x 10 second holds ? Supine: Bridge 2X10 (new) ? Straight leg raise 2X20 (new) ?Prone: Hip extension 2x 10 ? Hamstring flexion 20 (new) ?Sidelying:Hip abduction 2x 10  ? ? ?12/22/21 ?LAQ 10 x 5-10 second holds ?Hip extension 2x 10 ?Hip abduction 2x 10  ?Hamstring iso 10 x 5-10 second holds ?  ?  ?PATIENT EDUCATION:  ?Education details: goal and HEP review; encouraged to resume HEP  ?Person educated: Patient ?Education method: Explanation, Demonstration, and Handouts ?Education comprehension: verbalized understanding, demonstrated ?  ?  ?HOME EXERCISE PROGRAM: ?LAQ 10 x 5-10 second holds ?Hip extension 2x 10 ?Hip abduction 2x 10  ?Hamstring iso 10 x 5-10 second holds ?  ?ASSESSMENT: ?  ?CLINICAL IMPRESSION: ? Reviewed goals and POC moving forward.  Pt able to recall HEP and complete with minimal cues.  Progressed with addition of glute and hamstring strengthening exercises in addition to established therex.  Pt without complaints or issues during exercise or at conclusion of session today. ?  ?  ?GOALS: ?Goals reviewed with patient? Yes ?  ?SHORT TERM GOALS: Target date: 01/12/22 ?  ?Patient will be independent with HEP in order to improve functional outcomes. ?Baseline:  ?Goal status: ONGOING ?  ?2.  Patient will report at least 25% improvement in symptoms for improved  quality of life. ?Baseline:  ?Goal status: ONGOING ?  ?  ?  ?LONG TERM GOALS: Target date: 02/02/22 ?  ?Patient will report at least 75% improvement in symptoms for improved quality of life. ?Baseline:  ?Goal status: ONGOING  ?  ?2.  Patient demonstrate 5/5 MMT throughout muscles tested to demonstrate improving strength for completing chores. ?Baseline:  ?Goal  status: ONGOING ?  ?3.  Patient will be able to perform forward step down test without compensation in order to demonstrate improved LE strength. ?Baseline:  ?Goal status: ONGOING ?  ?4.  Patient will be able to return to all activities unrestricted for improved ability to perform work functions and participate with family.  ?Baseline:  ?Goal status: ONGOING ?  ?  ?  ?  ?PLAN: ?PT FREQUENCY: 1x/week ?  ?PT DURATION: 6 weeks ?  ?PLANNED INTERVENTIONS: Therapeutic exercises, Therapeutic activity, Neuromuscular re-education, Balance training, Gait training, Patient/Family education, Joint manipulation, Joint mobilization, Stair training, Orthotic/Fit training, DME instructions, Aquatic Therapy, Dry Needling, Electrical stimulation, Spinal manipulation, Spinal mobilization, Cryotherapy, Moist heat, Compression bandaging, scar mobilization, Splintting, Taping, Traction, Ultrasound, Ionotophoresis '4mg'$ /ml Dexamethasone, and Manual therapy ?  ?PLAN FOR NEXT SESSION: f/u with HEP compliance, continue hip and LLE strengthening. Next session begin standing strengthening and eccentric step exercises.  ?  ? ? ? ?4:26 PM, 01/06/22 ?Shannah Conteh Small Zarya Lasseigne MPT ?[facility] ?[place] [phone] ?Ph:[phone] ? ?   ?

## 2022-01-07 ENCOUNTER — Ambulatory Visit: Payer: Medicaid Other | Admitting: Adult Health

## 2022-01-11 ENCOUNTER — Ambulatory Visit (HOSPITAL_COMMUNITY): Payer: Medicaid Other | Attending: Orthopedic Surgery | Admitting: Physical Therapy

## 2022-01-11 ENCOUNTER — Encounter (HOSPITAL_COMMUNITY): Payer: Self-pay | Admitting: Physical Therapy

## 2022-01-11 DIAGNOSIS — M6281 Muscle weakness (generalized): Secondary | ICD-10-CM

## 2022-01-11 DIAGNOSIS — R29898 Other symptoms and signs involving the musculoskeletal system: Secondary | ICD-10-CM

## 2022-01-11 DIAGNOSIS — M25562 Pain in left knee: Secondary | ICD-10-CM | POA: Diagnosis not present

## 2022-01-11 DIAGNOSIS — R2689 Other abnormalities of gait and mobility: Secondary | ICD-10-CM

## 2022-01-11 NOTE — Therapy (Signed)
?OUTPATIENT PHYSICAL THERAPY TREATMENT NOTE ? ? ?Patient Name: Donna [name] ?MRN: [id number] ?DOB:November 24, 1970, 51 y.o., female ?Today's Date: 01/11/2022 ? ?PCP: Renee Rival, FNP ?REFERRING PROVIDER: Mordecai Rasmussen, MD ? PT End of Session - 01/11/22 1354   ? ? Visit Number 3   ? Number of Visits 7   ? Date for PT Re-Evaluation 02/02/22   ? Authorization Type Medicaid Healthy Blue   ? Authorization Time Period 6 visits approved 3/16 -4/26   ? Authorization - Visit Number 2   ? Authorization - Number of Visits 6   ? PT Start Time 1355   ? PT Stop Time 1435   ? PT Time Calculation (min) 40 min   ? Activity Tolerance Patient tolerated treatment well   ? Behavior During Therapy [facility] for tasks assessed/performed   ? ?  ?  ? ?  ? ? ?Past Medical History:  ?Diagnosis Date  ? Anemia   ? Contraceptive management 04/21/2014  ? Environmental allergies 12/30/2014  ? Herpes   ? History of anemia 05/05/2016  ? History of herpes simplex infection 04/21/2014  ? Irregular bleeding 05/05/2015  ? Itching in the vaginal area 05/05/2016  ? Retained tampon 05/05/2015  ? Umbilical hernia 9/73/5329  ? Vaginal discharge 05/05/2015  ? ?Past Surgical History:  ?Procedure Laterality Date  ? CERVICAL CERCLAGE  2010 and 2004  ? [employer] cerclage  ? CESAREAN SECTION  2012  ? [facility]  ? UMBILICAL HERNIA REPAIR  01/30/2012  ? Procedure: HERNIA REPAIR UMBILICAL ADULT;  Surgeon: Donato Heinz, MD;  Location: [facility];  Service: General;  Laterality: N/A;  With mesh  ? ?Patient Active Problem List  ? Diagnosis Date Noted  ? Trichimoniasis 10/15/2019  ? Vaginal odor 10/09/2019  ? Vaginal bleeding 10/09/2019  ? Screening examination for STD (sexually transmitted disease) 10/09/2019  ? Elevated BP without diagnosis of hypertension 10/09/2019  ? Iron deficiency anemia due to chronic blood loss 10/09/2019  ? Uterine fibroids 09/11/2017  ? Screening for colorectal cancer 06/27/2017  ? History of anemia 05/05/2016  ? Irregular bleeding 05/05/2015  ? Retained  tampon 05/05/2015  ? Umbilical hernia 92/42/6834  ? Environmental allergies 12/30/2014  ? History of herpes simplex infection 04/21/2014  ? ? ?REFERRING DIAG: chronic pain of Lt knee ? ?THERAPY DIAG:  ?Left knee pain, unspecified chronicity ? ?Muscle weakness (generalized) ? ?Other abnormalities of gait and mobility ? ?Other symptoms and signs involving the musculoskeletal system ? ?PERTINENT HISTORY: none ? ?PRECAUTIONS: none ? ?SUBJECTIVE: Knee hasn't been bothering her as much. Did a lot of walking so leg is a little swollen. Has been walking more. Still doing exercises.  ? ?PAIN:  ?Are you having pain? Yes: NPRS scale: 0/10 ?Pain location: Lt medial knee ?Pain description: burning, aching ?Aggravating factors: first thing in morning; inactivity ?Relieving factors: ice, creams, pain meds, walking/movement ? ? ?OBJECTIVE:  ?  ?  ?TODAY'S TREATMENT: ?01/11/22 ?Bike 5 minutes level 3 for dynamic warm up ?HR 1x 20  ?Step up 6 inch step 2x 15  ?Lateral step down 3x 10 eccentric control  ?STS with 10 lb dumbell 2x 10  ?Squat 2x 15  ? ? ?12/30/21 ?Sitting: LAQ 10 x 10 second holds ? Supine: Bridge 2X10 (new) ? Straight leg raise 2X20 (new) ?Prone: Hip extension 2x 10 ? Hamstring flexion 20 (new) ?Sidelying:Hip abduction 2x 10  ? ? ?12/22/21 ?LAQ 10 x 5-10 second holds ?Hip extension 2x 10 ?Hip abduction 2x 10  ?Hamstring iso  10 x 5-10 second holds ?  ?  ?PATIENT EDUCATION:  ?Education details: HEP , anatomy, biomechanics of knee/hip ?Person educated: Patient ?Education method: Explanation, Demonstration, and Handouts ?Education comprehension: verbalized understanding, demonstrated ?  ?  ?HOME EXERCISE PROGRAM: ?LAQ 10 x 5-10 second holds ?Hip extension 2x 10 ?Hip abduction 2x 10  ?Hamstring iso 10 x 5-10 second holds ?  ?ASSESSMENT: ?  ?CLINICAL IMPRESSION: ?Began session on bike for dynamic warm up and patient notes fatigue following. Patient given intermittent cueing for rest breaks, exercise, mechanics, motor control,  and alignment with good carry over. Patient states improvement in symptoms following session. Patient highly motivated to improve function. Patient will continue to benefit from physical therapy in order to improve function and reduce impairment. ? ?  ?  ?GOALS: ?Goals reviewed with patient? Yes ?  ?SHORT TERM GOALS: Target date: 01/12/22 ?  ?Patient will be independent with HEP in order to improve functional outcomes. ?Baseline:  ?Goal status: ONGOING ?  ?2.  Patient will report at least 25% improvement in symptoms for improved quality of life. ?Baseline:  ?Goal status: ONGOING ?  ?  ?  ?LONG TERM GOALS: Target date: 02/02/22 ?  ?Patient will report at least 75% improvement in symptoms for improved quality of life. ?Baseline:  ?Goal status: ONGOING  ?  ?2.  Patient demonstrate 5/5 MMT throughout muscles tested to demonstrate improving strength for completing chores. ?Baseline:  ?Goal status: ONGOING ?  ?3.  Patient will be able to perform forward step down test without compensation in order to demonstrate improved LE strength. ?Baseline:  ?Goal status: ONGOING ?  ?4.  Patient will be able to return to all activities unrestricted for improved ability to perform work functions and participate with family.  ?Baseline:  ?Goal status: ONGOING ?  ?  ?  ?  ?PLAN: ?PT FREQUENCY: 1x/week ?  ?PT DURATION: 6 weeks ?  ?PLANNED INTERVENTIONS: Therapeutic exercises, Therapeutic activity, Neuromuscular re-education, Balance training, Gait training, Patient/Family education, Joint manipulation, Joint mobilization, Stair training, Orthotic/Fit training, DME instructions, Aquatic Therapy, Dry Needling, Electrical stimulation, Spinal manipulation, Spinal mobilization, Cryotherapy, Moist heat, Compression bandaging, scar mobilization, Splintting, Taping, Traction, Ultrasound, Ionotophoresis '4mg'$ /ml Dexamethasone, and Manual therapy ?  ?PLAN FOR NEXT SESSION: f/u with HEP compliance, continue hip and LLE strengthening. Next session begin  standing strengthening and eccentric step exercises.  ?  ? ? ? ?1:56 PM, 01/11/22 ?Mearl Latin PT, DPT ?Physical Therapist at [facility] ?[facility] ? ? ?   ?

## 2022-01-11 NOTE — Patient Instructions (Signed)
Access Code: [email or web address] ?URL: [email or web address] ?Date: 01/11/2022 ?Prepared by: Mitzi Hansen Edina Winningham ? ?Exercises ?- Lateral Step Down  - 1 x daily - 7 x weekly - 3 sets - 10 reps ?- Squat  - 1 x daily - 7 x weekly - 3 sets - 10 reps ?- Sit to Stand with Arms Crossed  - 1 x daily - 7 x weekly - 3 sets - 10 reps ?

## 2022-01-18 ENCOUNTER — Encounter (HOSPITAL_COMMUNITY): Payer: Self-pay | Admitting: Physical Therapy

## 2022-01-18 ENCOUNTER — Ambulatory Visit (HOSPITAL_COMMUNITY): Payer: Medicaid Other | Admitting: Physical Therapy

## 2022-01-18 DIAGNOSIS — M25562 Pain in left knee: Secondary | ICD-10-CM

## 2022-01-18 DIAGNOSIS — M6281 Muscle weakness (generalized): Secondary | ICD-10-CM | POA: Diagnosis not present

## 2022-01-18 DIAGNOSIS — R2689 Other abnormalities of gait and mobility: Secondary | ICD-10-CM | POA: Diagnosis not present

## 2022-01-18 DIAGNOSIS — R29898 Other symptoms and signs involving the musculoskeletal system: Secondary | ICD-10-CM

## 2022-01-18 NOTE — Patient Instructions (Signed)
Access Code: [email or web address] ?URL: [email or web address] ?Date: 01/18/2022 ?Prepared by: Mitzi Hansen Davida Falconi ? ?Exercises ?- Side Stepping with Resistance at Thighs  - 1 x daily - 7 x weekly - 4 sets - 10 reps ?

## 2022-01-18 NOTE — Therapy (Signed)
?OUTPATIENT PHYSICAL THERAPY TREATMENT NOTE/Recert ? ? ?Patient Name: Donna [name] ?MRN: [id number] ?DOB:09/22/1971, 51 y.o., female ?Today's Date: 01/18/2022 ? ?PCP: Renee Rival, FNP ?REFERRING PROVIDER: Mordecai Rasmussen, MD ? PT End of Session - 01/18/22 1402   ? ? Visit Number 4   ? Number of Visits 13   ? Date for PT Re-Evaluation 03/01/22   ? Authorization Type Medicaid Healthy Blue   ? Authorization Time Period 6 visits approved 3/16 -4/26   ? Authorization - Visit Number 3   ? Authorization - Number of Visits 6   ? PT Start Time 1401   ? PT Stop Time 3267   ? PT Time Calculation (min) 38 min   ? Activity Tolerance Patient tolerated treatment well   ? Behavior During Therapy [facility] for tasks assessed/performed   ? ?  ?  ? ?  ? ? ?Past Medical History:  ?Diagnosis Date  ? Anemia   ? Contraceptive management 04/21/2014  ? Environmental allergies 12/30/2014  ? Herpes   ? History of anemia 05/05/2016  ? History of herpes simplex infection 04/21/2014  ? Irregular bleeding 05/05/2015  ? Itching in the vaginal area 05/05/2016  ? Retained tampon 05/05/2015  ? Umbilical hernia 11/03/5807  ? Vaginal discharge 05/05/2015  ? ?Past Surgical History:  ?Procedure Laterality Date  ? CERVICAL CERCLAGE  2010 and 2004  ? [employer] cerclage  ? CESAREAN SECTION  2012  ? Women[facility]  ? UMBILICAL HERNIA REPAIR  01/30/2012  ? Procedure: HERNIA REPAIR UMBILICAL ADULT;  Surgeon: Donato Heinz, MD;  Location: [facility];  Service: General;  Laterality: N/A;  With mesh  ? ?Patient Active Problem List  ? Diagnosis Date Noted  ? Trichimoniasis 10/15/2019  ? Vaginal odor 10/09/2019  ? Vaginal bleeding 10/09/2019  ? Screening examination for STD (sexually transmitted disease) 10/09/2019  ? Elevated BP without diagnosis of hypertension 10/09/2019  ? Iron deficiency anemia due to chronic blood loss 10/09/2019  ? Uterine fibroids 09/11/2017  ? Screening for colorectal cancer 06/27/2017  ? History of anemia 05/05/2016  ? Irregular bleeding 05/05/2015  ?  Retained tampon 05/05/2015  ? Umbilical hernia 98/33/8250  ? Environmental allergies 12/30/2014  ? History of herpes simplex infection 04/21/2014  ? ? ?REFERRING DIAG: chronic pain of Lt knee ? ?THERAPY DIAG:  ?Left knee pain, unspecified chronicity ? ?Muscle weakness (generalized) ? ?Other abnormalities of gait and mobility ? ?Other symptoms and signs involving the musculoskeletal system ? ?PERTINENT HISTORY: none ? ?PRECAUTIONS: none ? ?SUBJECTIVE: Knee is bothering her. Better following exercises.  ? ?PAIN:  ?Are you having pain? Yes: NPRS scale: 7/10 ?Pain location: Lt medial knee ?Pain description: burning, aching ?Aggravating factors: first thing in morning; inactivity ?Relieving factors: ice, creams, pain meds, walking/movement ? ? ?OBJECTIVE:  ?TTP L  medial joint line, VMO  ?  ?TODAY'S TREATMENT: ?01/18/22 ?Bike 5 minutes level 3 for dynamic warm up ?Step up with knee drive 6 inch step 2x 10 bilateral  ?Lateral step down 3x 10 eccentric control  ?Squat 1x 15, 1 x 15 with 5 second holds ?Lateral stepping 4x 15 feet with green band at knees and mini squat ? ?01/11/22 ?Bike 5 minutes level 3 for dynamic warm up ?HR 1x 20  ?Step up 6 inch step 2x 15  ?Lateral step down 3x 10 eccentric control  ?STS with 10 lb dumbell 2x 10  ?Squat 2x 15  ? ? ?12/30/21 ?Sitting: LAQ 10 x 10 second holds ? Supine: Bridge 2X10 (  new) ? Straight leg raise 2X20 (new) ?Prone: Hip extension 2x 10 ? Hamstring flexion 20 (new) ?Sidelying:Hip abduction 2x 10  ? ? ?12/22/21 ?LAQ 10 x 5-10 second holds ?Hip extension 2x 10 ?Hip abduction 2x 10  ?Hamstring iso 10 x 5-10 second holds ?  ?  ?PATIENT EDUCATION:  ?Education details: HEP , anatomy, biomechanics of knee/hip ?Person educated: Patient ?Education method: Explanation, Demonstration, and Handouts ?Education comprehension: verbalized understanding, demonstrated ?  ?  ?HOME EXERCISE PROGRAM: ?LAQ 10 x 5-10 second holds ?Hip extension 2x 10 ?Hip abduction 2x 10  ?Hamstring iso 10 x 5-10 second  holds ?  ?ASSESSMENT: ?  ?CLINICAL IMPRESSION: ?Began session on bike for dynamic warm up. Patient TTP at medial joint line. Continued with strengthening which is tolerated well with decrease in symptoms at end of session. Demonstrates good squat mechanics with intermittent weight shift off LLE with increased depth. Extending POC as patient continues to demonstrate impaired strength and knee symptoms. Patient will continue to benefit from physical therapy in order to improve function and reduce impairment. ? ?  ?  ?GOALS: ?Goals reviewed with patient? Yes ?  ?SHORT TERM GOALS: Target date: 01/12/22 ?  ?Patient will be independent with HEP in order to improve functional outcomes. ?Baseline:  ?Goal status: ONGOING ?  ?2.  Patient will report at least 25% improvement in symptoms for improved quality of life. ?Baseline:  ?Goal status: ONGOING ?  ?  ?  ?LONG TERM GOALS: Target date: 02/02/22 ?  ?Patient will report at least 75% improvement in symptoms for improved quality of life. ?Baseline:  ?Goal status: ONGOING  ?  ?2.  Patient demonstrate 5/5 MMT throughout muscles tested to demonstrate improving strength for completing chores. ?Baseline:  ?Goal status: ONGOING ?  ?3.  Patient will be able to perform forward step down test without compensation in order to demonstrate improved LE strength. ?Baseline:  ?Goal status: ONGOING ?  ?4.  Patient will be able to return to all activities unrestricted for improved ability to perform work functions and participate with family.  ?Baseline:  ?Goal status: ONGOING ?  ?  ?  ?  ?PLAN: ?PT FREQUENCY: 1x/week ?  ?PT DURATION: 6 weeks ?  ?PLANNED INTERVENTIONS: Therapeutic exercises, Therapeutic activity, Neuromuscular re-education, Balance training, Gait training, Patient/Family education, Joint manipulation, Joint mobilization, Stair training, Orthotic/Fit training, DME instructions, Aquatic Therapy, Dry Needling, Electrical stimulation, Spinal manipulation, Spinal mobilization,  Cryotherapy, Moist heat, Compression bandaging, scar mobilization, Splintting, Taping, Traction, Ultrasound, Ionotophoresis '4mg'$ /ml Dexamethasone, and Manual therapy ?  ?PLAN FOR NEXT SESSION: f/u with HEP compliance, continue hip and LLE strengthening. Continue standing strengthening and eccentric step exercises. Request additional Medicaid Healthy blue visits ?  ? ? ? ?2:42 PM, 01/18/22 ?Mearl Latin PT, DPT ?Physical Therapist at [facility] ?[facility] ? ? ?   ?

## 2022-01-21 ENCOUNTER — Ambulatory Visit: Payer: Medicaid Other | Admitting: Nurse Practitioner

## 2022-01-21 ENCOUNTER — Encounter: Payer: Self-pay | Admitting: Nurse Practitioner

## 2022-01-21 VITALS — BP 138/88 | HR 82 | Ht 62.0 in | Wt 154.0 lb

## 2022-01-21 DIAGNOSIS — R030 Elevated blood-pressure reading, without diagnosis of hypertension: Secondary | ICD-10-CM

## 2022-01-21 DIAGNOSIS — Z0000 Encounter for general adult medical examination without abnormal findings: Secondary | ICD-10-CM

## 2022-01-21 DIAGNOSIS — F320 Major depressive disorder, single episode, mild: Secondary | ICD-10-CM | POA: Diagnosis not present

## 2022-01-21 DIAGNOSIS — Z1211 Encounter for screening for malignant neoplasm of colon: Secondary | ICD-10-CM

## 2022-01-21 DIAGNOSIS — Z9109 Other allergy status, other than to drugs and biological substances: Secondary | ICD-10-CM

## 2022-01-21 DIAGNOSIS — L209 Atopic dermatitis, unspecified: Secondary | ICD-10-CM | POA: Diagnosis not present

## 2022-01-21 DIAGNOSIS — Z0001 Encounter for general adult medical examination with abnormal findings: Secondary | ICD-10-CM | POA: Insufficient documentation

## 2022-01-21 DIAGNOSIS — Z8619 Personal history of other infectious and parasitic diseases: Secondary | ICD-10-CM | POA: Diagnosis not present

## 2022-01-21 DIAGNOSIS — J302 Other seasonal allergic rhinitis: Secondary | ICD-10-CM | POA: Diagnosis not present

## 2022-01-21 DIAGNOSIS — Z636 Dependent relative needing care at home: Secondary | ICD-10-CM | POA: Diagnosis not present

## 2022-01-21 MED ORDER — TRIAMCINOLONE ACETONIDE 0.1 % EX CREA: 1.0000 "application " | TOPICAL_CREAM | Freq: Two times a day (BID) | CUTANEOUS | 0 refills | Status: DC

## 2022-01-21 NOTE — Assessment & Plan Note (Signed)
Rx Kenalog0.1% cream use twice daily ?Keep skin moisturized, scented irritating skin products ?Will refer to dermatology if symptoms does not get better on kanalog 0.1% craem  cream  ?

## 2022-01-21 NOTE — Assessment & Plan Note (Signed)
Lost her dad  4 months ago ?Denies SI, HI PHQ 9 score 1 ?Refused referral for counseling, that she will feel better once she starts working. ?

## 2022-01-21 NOTE — Assessment & Plan Note (Signed)
Annual exam as documented.  ?Counseling done include healthy lifestyle involving committing to 150 minutes of exercise per week, heart healthy diet, and attaining healthy weight. The importance of adequate sleep also discussed.  ?Regular use of seat belt and home safety were also discussed . ?Immunization and cancer screening  needs are specifically addressed at this visit.   ?colonoscopy ordered.  ?

## 2022-01-21 NOTE — Assessment & Plan Note (Deleted)
Chronic condition well-controlled on Flonase nasal spray daily ?

## 2022-01-21 NOTE — Assessment & Plan Note (Addendum)
Stress from taking care of her husband with multiple myeloma ?Lost her  dad 4 months ago ?Denies SI, HI, PHQ9 score 1 ?Refused referral  for counseling.  ?

## 2022-01-21 NOTE — Assessment & Plan Note (Signed)
Chronic condition well-controlled on Flonase nasal spray daily ? ?

## 2022-01-21 NOTE — Assessment & Plan Note (Signed)
On Valtrex 500 mg daily as needed ?Denies recent herpes outbreak ?

## 2022-01-21 NOTE — Progress Notes (Signed)
? ?New Patient Office Visit ? ?Subjective:  ?Patient ID: Donna [name], female    DOB: 1971-09-04  Age: 51 y.o. MRN: [id number] ? ?CC:  ?Chief Complaint  ?Patient presents with  ? New Patient (Initial Visit)  ?  NP  ? Depression  ? ? ?HPI ?Malva Limes with past medical history of uterine fibroids, environmental allergies elevated BP without diagnosis of hypertension, iron deficiency anemia due to chronic blood loss presents to establish care and annual physical examination.  Has no previous PCP was going to the health department in urgent care for healthcare needs ? ?Had left breast biopsy for abnormal mammogram , states that the biopsy was normal.  ? ?Patient complains of chronic knee pain worse in the winter Currently doing PT, PT is helping, takes ibuprofen, tylenol arthritis .  ? ? ?Depression started some months ago when she lost her dad . Did not want to be around anyone,has been stressed taking care of  her fiance who has multiple myeloma,has been on FMLA for the past one year, denies SI, HI . Thinks that going back to work will help her feel better.  Refused referral for counseling ? ?Herpes. Takes valtrex 51m daily as needed, at least twice a month, denies any herpes outbreak  ? ? ?Seasonal allergies . Sneezing itching eyes and itching throat. Symptoms are worse in April.Uses Flonase and Singulair  ? ?She has stopped taking birthcontrol since March because she has not seen her period since March. Has hot flashes.  Thinks that she is in the perimenopause stage ? ?Patient complains of itchy rashes on her back since the past couple of months she has been using sensitive body products but she still dealing with itching, her children have asthma and also have same skin condition.  ? ?Last annual physical was a year ago , due for colonoscopy , COVID vaccines.  Up-to-date with Pap screening ? ? ? ? ?Past Medical History:  ?Diagnosis Date  ? Anemia   ? Contraceptive management 04/21/2014  ? Environmental  allergies 12/30/2014  ? Herpes   ? History of anemia 05/05/2016  ? History of herpes simplex infection 04/21/2014  ? Irregular bleeding 05/05/2015  ? Itching in the vaginal area 05/05/2016  ? Retained tampon 05/05/2015  ? Umbilical hernia 34/88/8916 ? Vaginal discharge 05/05/2015  ? ? ?Past Surgical History:  ?Procedure Laterality Date  ? CERVICAL CERCLAGE  2010 and 2004  ? [employer] cerclage  ? CESAREAN SECTION  2012  ? Women[facility]  ? UMBILICAL HERNIA REPAIR  01/30/2012  ? Procedure: HERNIA REPAIR UMBILICAL ADULT;  Surgeon: BDonato Heinz MD;  Location: [facility];  Service: General;  Laterality: N/A;  With mesh  ? ? ?Family History  ?Problem Relation Age of Onset  ? Diabetes Mother   ? Hypertension Mother   ? Diabetes Father   ? Hypertension Father   ? Multiple sclerosis Father   ? Asthma Daughter   ? Asthma Daughter   ? Cancer Maternal Grandfather   ?     prostate  ? Anesthesia problems Neg Hx   ? Hypotension Neg Hx   ? Malignant hyperthermia Neg Hx   ? Pseudochol deficiency Neg Hx   ? ? ?Social History  ? ?Socioeconomic History  ? Marital status: DSoil scientist ?  Spouse name: Not on file  ? Number of children: 3  ? Years of education: Not on file  ? Highest education level: Not on file  ?Occupational History  ? Not  on file  ?Tobacco Use  ? Smoking status: Never  ? Smokeless tobacco: Never  ?Vaping Use  ? Vaping Use: Never used  ?Substance and Sexual Activity  ? Alcohol use: No  ? Drug use: No  ? Sexual activity: Yes  ?  Birth control/protection: Pill  ?Other Topics Concern  ? Not on file  ?Social History Narrative  ? Lives with fiance, and her children. Not working currently due to taking care of her fiance who has multiple myeloma.   ? ?Social Determinants of Health  ? ?Financial Resource Strain: Not on file  ?Food Insecurity: Not on file  ?Transportation Needs: Not on file  ?Physical Activity: Not on file  ?Stress: Not on file  ?Social Connections: Not on file  ?Intimate Partner Violence: Not on file  ? ? ?ROS ?Review  of Systems  ?Constitutional: Negative.   ?HENT: Negative.    ?Eyes: Negative.   ?Respiratory: Negative.    ?Cardiovascular: Negative.   ?Gastrointestinal: Negative.   ?Endocrine: Negative.   ?Genitourinary: Negative.   ?Musculoskeletal: Negative.   ?Skin:  Positive for rash. Negative for color change, pallor and wound.  ?Allergic/Immunologic: Negative.   ?Neurological: Negative.   ?Hematological: Negative.   ?Psychiatric/Behavioral: Negative.    ? ?Objective:  ? ?Today's Vitals: BP 138/88 (BP Location: Left Arm, Patient Position: Sitting, Cuff Size: Large)   Pulse 82   Ht '5\' 2"'  (1.575 m)   Wt 154 lb (69.9 kg)   LMP 11/18/2021 (Approximate)   SpO2 100%   BMI 28.17 kg/m?  ?Patient deferred breast exam today ?Physical Exam ?Constitutional:   ?   General: She is not in acute distress. ?   Appearance: Normal appearance. She is obese. She is not ill-appearing, toxic-appearing or diaphoretic.  ?HENT:  ?   Head: Normocephalic and atraumatic.  ?   Right Ear: Tympanic membrane, ear canal and external ear normal. There is no impacted cerumen.  ?   Left Ear: Tympanic membrane, ear canal and external ear normal. There is no impacted cerumen.  ?   Nose: Nose normal. No congestion or rhinorrhea.  ?   Mouth/Throat:  ?   Mouth: Mucous membranes are moist.  ?   Pharynx: Oropharynx is clear. No oropharyngeal exudate or posterior oropharyngeal erythema.  ?Eyes:  ?   General: No scleral icterus.    ?   Right eye: No discharge.     ?   Left eye: No discharge.  ?   Extraocular Movements: Extraocular movements intact.  ?   Conjunctiva/sclera: Conjunctivae normal.  ?   Pupils: Pupils are equal, round, and reactive to light.  ?Neck:  ?   Vascular: No carotid bruit.  ?Cardiovascular:  ?   Rate and Rhythm: Normal rate and regular rhythm.  ?   Pulses: Normal pulses.  ?   Heart sounds: Normal heart sounds. No murmur heard. ?  No friction rub. No gallop.  ?Pulmonary:  ?   Effort: Pulmonary effort is normal. No respiratory distress.  ?    Breath sounds: Normal breath sounds. No stridor. No wheezing, rhonchi or rales.  ?Chest:  ?   Chest wall: No tenderness.  ?Abdominal:  ?   General: There is no distension.  ?   Palpations: Abdomen is soft. There is no mass.  ?   Tenderness: There is no abdominal tenderness. There is no right CVA tenderness, left CVA tenderness, guarding or rebound.  ?   Hernia: No hernia is present.  ?Musculoskeletal:     ?  General: No swelling, tenderness, deformity or signs of injury.  ?   Cervical back: Normal range of motion and neck supple. No rigidity or tenderness.  ?   Right lower leg: No edema.  ?   Left lower leg: No edema.  ?   Comments: Has mild varus alignment bilaterally   ?Lymphadenopathy:  ?   Cervical: No cervical adenopathy.  ?Skin: ?   General: Skin is dry.  ?   Capillary Refill: Capillary refill takes less than 2 seconds.  ?   Coloration: Skin is not jaundiced or pale.  ?   Findings: Rash present. No bruising, erythema or lesion.  ?Neurological:  ?   Mental Status: She is alert and oriented to person, place, and time.  ?   Cranial Nerves: No cranial nerve deficit.  ?   Sensory: No sensory deficit.  ?   Motor: No weakness.  ?   Coordination: Coordination normal.  ?   Gait: Gait normal.  ?   Deep Tendon Reflexes: Reflexes normal.  ?Psychiatric:     ?   Mood and Affect: Mood normal.     ?   Behavior: Behavior normal.     ?   Thought Content: Thought content normal.     ?   Judgment: Judgment normal.  ?   Comments: Multiple flat pruritic hyper pigmented rashes noted on the back  ? ? ?Assessment & Plan:  ? ?Problem List Items Addressed This Visit   ? ?  ? Musculoskeletal and Integument  ? Atopic dermatitis in adult  ?  Rx Kenalog0.1% cream use twice daily ?Keep skin moisturized, scented irritating skin products ?Will refer to dermatology if symptoms does not get better on kanalog 0.1% craem  cream  ? ?  ?  ? Relevant Medications  ? triamcinolone cream (KENALOG) 0.1 %  ?  ? Other  ? History of herpes simplex  infection  ?  On Valtrex 500 mg daily as needed ?Denies recent herpes outbreak ? ?  ?  ? Environmental allergies  ?  Chronic condition well-controlled on Flonase nasal spray daily ? ? ?  ?  ? Elevated BP without diagnosis

## 2022-01-21 NOTE — Assessment & Plan Note (Signed)
BP Readings from Last 3 Encounters:  ?01/21/22 138/88  ?10/04/21 (!) 148/77  ?08/10/21 (!) 135/93  ?Not on medication ?DASH diet advised engage in regular exercise at least 150 minutes weekly. ? ?

## 2022-01-21 NOTE — Patient Instructions (Addendum)
Pleas apply kenalog cream twice daily to your rashes  ? ?It is important that you exercise regularly at least 30 minutes 5 times a week.  ?Think about what you will eat, plan ahead. ?Choose " clean, green, fresh or frozen" over canned, processed or packaged foods which are more sugary, salty and fatty. ?70 to 75% of food eaten should be vegetables and fruit. ?Three meals at set times with snacks allowed between meals, but they must be fruit or vegetables. ?Aim to eat over a 12 hour period , example 7 am to 7 pm, and STOP after  your last meal of the day. ?Drink water,generally about 64 ounces per day, no other drink is as healthy. Fruit juice is best enjoyed in a healthy way, by EATING the fruit. ? ?Thanks for choosing [facility], we consider it a privelige to serve you. ? ?

## 2022-01-24 DIAGNOSIS — Z0000 Encounter for general adult medical examination without abnormal findings: Secondary | ICD-10-CM | POA: Diagnosis not present

## 2022-01-25 ENCOUNTER — Other Ambulatory Visit: Payer: Self-pay | Admitting: Nurse Practitioner

## 2022-01-25 ENCOUNTER — Encounter: Payer: Self-pay | Admitting: Internal Medicine

## 2022-01-25 DIAGNOSIS — E559 Vitamin D deficiency, unspecified: Secondary | ICD-10-CM

## 2022-01-25 LAB — CMP14+EGFR
ALT: 15 IU/L (ref 0–32)
AST: 18 IU/L (ref 0–40)
Albumin/Globulin Ratio: 1.6 (ref 1.2–2.2)
Albumin: 4.2 g/dL (ref 3.8–4.8)
Alkaline Phosphatase: 88 IU/L (ref 44–121)
BUN/Creatinine Ratio: 15 (ref 9–23)
BUN: 14 mg/dL (ref 6–24)
Bilirubin Total: <0.2 mg/dL (ref 0.0–1.2)
CO2: 26 mmol/L (ref 20–29)
Calcium: 9.8 mg/dL (ref 8.7–10.2)
Chloride: 106 mmol/L (ref 96–106)
Creatinine, Ser: 0.92 mg/dL (ref 0.57–1.00)
Globulin, Total: 2.7 g/dL (ref 1.5–4.5)
Glucose: 87 mg/dL (ref 70–99)
Potassium: 4.3 mmol/L (ref 3.5–5.2)
Sodium: 143 mmol/L (ref 134–144)
Total Protein: 6.9 g/dL (ref 6.0–8.5)
eGFR: 76 mL/min/{1.73_m2} (ref >59)

## 2022-01-25 LAB — LIPID PANEL
Chol/HDL Ratio: 3.3 ratio (ref 0.0–4.4)
Cholesterol, Total: 163 mg/dL (ref 100–199)
HDL: 50 mg/dL (ref >39)
LDL Chol Calc (NIH): 87 mg/dL (ref 0–99)
Triglycerides: 147 mg/dL (ref 0–149)
VLDL Cholesterol Cal: 26 mg/dL (ref 5–40)

## 2022-01-25 LAB — CBC WITH DIFFERENTIAL/PLATELET
Basophils Absolute: 0.1 10*3/uL (ref 0.0–0.2)
Basos: 1 %
EOS (ABSOLUTE): 0.1 10*3/uL (ref 0.0–0.4)
Eos: 2 %
Hematocrit: 39.4 % (ref 34.0–46.6)
Hemoglobin: 12.9 g/dL (ref 11.1–15.9)
Immature Grans (Abs): 0 10*3/uL (ref 0.0–0.1)
Immature Granulocytes: 0 %
Lymphocytes Absolute: 1.3 10*3/uL (ref 0.7–3.1)
Lymphs: 22 %
MCH: 26.2 pg — ABNORMAL LOW (ref 26.6–33.0)
MCHC: 32.7 g/dL (ref 31.5–35.7)
MCV: 80 fL (ref 79–97)
Monocytes Absolute: 0.4 10*3/uL (ref 0.1–0.9)
Monocytes: 7 %
Neutrophils Absolute: 3.9 10*3/uL (ref 1.4–7.0)
Neutrophils: 68 %
Platelets: 313 10*3/uL (ref 150–450)
RBC: 4.93 x10E6/uL (ref 3.77–5.28)
RDW: 16.5 % — ABNORMAL HIGH (ref 11.7–15.4)
WBC: 5.7 10*3/uL (ref 3.4–10.8)

## 2022-01-25 LAB — VITAMIN D 25 HYDROXY (VIT D DEFICIENCY, FRACTURES): Vit D, 25-Hydroxy: 27.5 ng/mL — ABNORMAL LOW (ref 30.0–100.0)

## 2022-01-25 LAB — HEPATITIS C ANTIBODY: Hep C Virus Ab: NONREACTIVE

## 2022-01-25 LAB — HEMOGLOBIN A1C
Est. average glucose Bld gHb Est-mCnc: 120 mg/dL
Hgb A1c MFr Bld: 5.8 % — ABNORMAL HIGH (ref 4.8–5.6)

## 2022-01-25 LAB — TSH: TSH: 1.08 u[IU]/mL (ref 0.450–4.500)

## 2022-01-25 MED ORDER — VITAMIN D3 25 MCG (1000 UT) PO CAPS: 1000.0000 [IU] | ORAL_CAPSULE | Freq: Every day | ORAL | 1 refills | Status: DC

## 2022-01-25 NOTE — Progress Notes (Signed)
?  Has prediabetes, avoid sugar sweets ordered engage in daily exercises ? ?Vitamin D level is low start taking vitamin D 1000 units daily. ? ?Other labs are normal ?Continue current medications

## 2022-01-26 ENCOUNTER — Encounter: Payer: Self-pay | Admitting: Nurse Practitioner

## 2022-01-26 ENCOUNTER — Encounter (HOSPITAL_COMMUNITY): Payer: Medicaid Other

## 2022-01-26 ENCOUNTER — Telehealth (HOSPITAL_COMMUNITY): Payer: Self-pay

## 2022-01-26 NOTE — Telephone Encounter (Signed)
No show, called and spoke to pt who thought she had already cancelled apt with therapist last session as she is out of town enjoying her 51th birthday.  Reminded next apt date and time. ? ?Ihor Austin, LPTA/CLT; CBIS ?[phone] ? ?

## 2022-01-27 NOTE — Telephone Encounter (Signed)
Called pt no answer left vm 

## 2022-02-02 ENCOUNTER — Ambulatory Visit (HOSPITAL_COMMUNITY): Payer: Medicaid Other | Admitting: Physical Therapy

## 2022-02-08 ENCOUNTER — Encounter (HOSPITAL_COMMUNITY): Payer: Medicaid Other

## 2022-02-09 ENCOUNTER — Telehealth (HOSPITAL_COMMUNITY): Payer: Self-pay

## 2022-02-09 NOTE — Telephone Encounter (Signed)
No show, called and left message concerning missed apt.  Included no show policy details and next apt date and time wiht contact information included. ? ?Ihor Austin, LPTA/CLT; CBIS ?[phone] ? ?

## 2022-02-15 ENCOUNTER — Ambulatory Visit (HOSPITAL_COMMUNITY): Payer: Medicaid Other | Attending: Orthopedic Surgery | Admitting: Physical Therapy

## 2022-02-15 ENCOUNTER — Telehealth (HOSPITAL_COMMUNITY): Payer: Self-pay | Admitting: Physical Therapy

## 2022-02-15 NOTE — Telephone Encounter (Signed)
Pt with 3rd consecutive NS today and per protocol will be discharged. Pt will need a new referral to return to therapy at this point.  Evaluating therapist notified to discharge patient. ? ?Teena Irani, PTA/CLT, WTA ?[phone] ? ?

## 2022-02-16 ENCOUNTER — Encounter (HOSPITAL_COMMUNITY): Payer: Self-pay | Admitting: Physical Therapy

## 2022-02-16 NOTE — Therapy (Signed)
[facility] ?[address] ?[place], [place], [address] ?Phone: [phone]   Fax:  [phone] ? ?Patient Details  ?Name: Donna [name] ?MRN: [id number] ?Date of Birth: [birth year] ?Referring Provider:  No ref. provider found ? ?Encounter Date: 02/16/2022 ? ? ?PHYSICAL THERAPY DISCHARGE SUMMARY ? ?Visits from Start of Care: 4 ? ?Current functional level related to goals / functional outcomes: ?Unknown as patient has not returned ?  ?Remaining deficits: ?Unknown as patient has not returned ?  ?Education / Equipment: ?HEP, LE anatomy  ? ?Patient agrees to discharge. Patient goals were not met. Patient is being discharged due to not returning since the last visit. ? ? ?8:21 AM, 02/16/22 ?Mearl Latin PT, DPT ?Physical Therapist at [facility] ?[facility] ? ? ?[place] [facility] ?[address] ?[place], [place], [address] ?Phone: [phone]   Fax:  [phone] ?

## 2022-02-22 ENCOUNTER — Encounter (HOSPITAL_COMMUNITY): Payer: Medicaid Other

## 2022-03-01 ENCOUNTER — Encounter (HOSPITAL_COMMUNITY): Payer: Medicaid Other

## 2022-03-08 ENCOUNTER — Encounter (HOSPITAL_COMMUNITY): Payer: Medicaid Other | Admitting: Physical Therapy

## 2022-03-15 ENCOUNTER — Encounter (HOSPITAL_COMMUNITY): Payer: Medicaid Other

## 2022-03-15 ENCOUNTER — Encounter: Payer: Self-pay | Admitting: *Deleted

## 2022-04-04 ENCOUNTER — Ambulatory Visit: Payer: Medicaid Other

## 2022-04-11 ENCOUNTER — Other Ambulatory Visit: Payer: Self-pay | Admitting: Nurse Practitioner

## 2022-04-11 DIAGNOSIS — L209 Atopic dermatitis, unspecified: Secondary | ICD-10-CM

## 2022-04-11 MED ORDER — TRIAMCINOLONE ACETONIDE 0.1 % EX CREA: 1.0000 | TOPICAL_CREAM | Freq: Two times a day (BID) | CUTANEOUS | 0 refills | Status: DC

## 2022-04-21 ENCOUNTER — Other Ambulatory Visit (HOSPITAL_COMMUNITY)
Admission: RE | Admit: 2022-04-21 | Discharge: 2022-04-21 | Disposition: A | Discharge status: Home / Self Care | Payer: Medicaid Other | Source: Ambulatory Visit | Attending: Family Medicine | Admitting: Family Medicine

## 2022-04-21 ENCOUNTER — Ambulatory Visit (INDEPENDENT_AMBULATORY_CARE_PROVIDER_SITE_OTHER): Payer: Medicaid Other | Admitting: Family Medicine

## 2022-04-21 ENCOUNTER — Encounter: Payer: Self-pay | Admitting: Family Medicine

## 2022-04-21 VITALS — BP 116/78 | HR 88 | Ht 61.0 in | Wt 159.0 lb

## 2022-04-21 DIAGNOSIS — F320 Major depressive disorder, single episode, mild: Secondary | ICD-10-CM

## 2022-04-21 DIAGNOSIS — F4321 Adjustment disorder with depressed mood: Secondary | ICD-10-CM | POA: Diagnosis not present

## 2022-04-21 DIAGNOSIS — N760 Acute vaginitis: Secondary | ICD-10-CM | POA: Insufficient documentation

## 2022-04-21 MED ORDER — TRAZODONE HCL 50 MG PO TABS: 50.0000 mg | ORAL_TABLET | Freq: Every day | ORAL | 1 refills | Status: DC

## 2022-04-21 NOTE — Patient Instructions (Addendum)
F./u wit hFola in 4 to 6 weeks,depression and grief call if you need to be seen sooner  New for depression , grief and situational anxiety is trazodone. You will also be referred to Therapist  Specimens are sent for STD testing per yopr request and results will be sent by Mychart , next week  Thanks for choosing [facility], we consider it a privelige to serve you.

## 2022-04-22 ENCOUNTER — Other Ambulatory Visit: Payer: Self-pay | Admitting: Nurse Practitioner

## 2022-04-22 ENCOUNTER — Other Ambulatory Visit: Payer: Self-pay | Admitting: Obstetrics & Gynecology

## 2022-04-22 DIAGNOSIS — N939 Abnormal uterine and vaginal bleeding, unspecified: Secondary | ICD-10-CM

## 2022-04-25 ENCOUNTER — Other Ambulatory Visit (HOSPITAL_COMMUNITY): Payer: Self-pay

## 2022-04-25 ENCOUNTER — Encounter: Payer: Self-pay | Admitting: Family Medicine

## 2022-04-25 LAB — CERVICOVAGINAL ANCILLARY ONLY
Bacterial Vaginitis (gardnerella): NEGATIVE
Candida Glabrata: NEGATIVE
Candida Vaginitis: NEGATIVE
Chlamydia: NEGATIVE
Comment: NEGATIVE
Comment: NEGATIVE
Comment: NEGATIVE
Comment: NEGATIVE
Comment: NEGATIVE
Comment: NORMAL
Neisseria Gonorrhea: NEGATIVE
Trichomonas: NEGATIVE

## 2022-04-25 MED ORDER — NORETHINDRONE 0.35 MG PO TABS: 1.0000 | ORAL_TABLET | Freq: Every day | ORAL | 4 refills | Status: DC

## 2022-04-25 MED ORDER — FERROUS SULFATE 325 (65 FE) MG PO TBEC
325.0000 mg | DELAYED_RELEASE_TABLET | Freq: Every day | ORAL | 2 refills | Status: DC
  Filled 2022-04-25 – 2022-08-22 (×2): qty 100, 100d supply, fill #0

## 2022-04-25 NOTE — Progress Notes (Signed)
   Donna [name]     MRN: [id number]      DOB: 10-19-1970   HPI Donna [name] is here requesting STD testing, wants to " be sure" due to recent break up with her ex also loss of close family member Donna [name] and depressed , not suicidal or homicidal, now willing to take medication and get therapy for this and will f/u with her PCP  ROS Denies recent fever or chills. Denies sinus pressure, nasal congestion, ear pain or sore throat. Denies chest congestion, productive cough or wheezing. Denies chest pains, palpitations and leg swelling Denies abdominal pain, nausea, vomiting,diarrhea or constipation.   Denies dysuria, frequency, hesitancy or incontinence. Denies joint pain, swelling and limitation in mobility. Denies headaches, seizures, numbness, or tingling.  Denies skin break down or rash.   PE  BP 116/78   Pulse 88   Ht '5\' 1"'$  (1.549 m)   Wt 159 lb 0.6 oz (72.1 kg)   SpO2 98%   BMI 30.05 kg/m   Patient alert and oriented and in no cardiopulmonary distress.  HEENT: No facial asymmetry, EOMI,     Neck supple .  Chest: Clear to auscultation bilaterally.  CVS: S1, S2 no murmurs, no S3.Regular rate.  ABD: Soft non tender.  Pelvic: no cervical motion or adnexal tenderness. Whits vaginal d/c. No visible ulcers externally or in vaginal canal Ext: No edema  MS: Adequate ROM spine, shoulders, hips and knees.  Skin: Intact, no ulcerations or rash noted.  Psych: Good eye contact, tearfiul,  anxious and  depressed appearing.  CNS: CN 2-12 intact, power,  normal throughout.no focal deficits noted.   Assessment & Plan  Depression, major, single episode, mild (HCC) Start SSRI, f/u with pCP andd trefer for therapy, pt agrees with plan  Vulvovaginitis Specimens sent for STD testing, will treat based on result

## 2022-04-25 NOTE — Assessment & Plan Note (Signed)
Specimens sent for STD testing, will treat based on result

## 2022-04-25 NOTE — Assessment & Plan Note (Signed)
Start SSRI, f/u with pCP andd trefer for therapy, pt agrees with plan

## 2022-04-27 ENCOUNTER — Encounter: Payer: Self-pay | Admitting: Nurse Practitioner

## 2022-04-28 ENCOUNTER — Other Ambulatory Visit: Payer: Self-pay

## 2022-04-28 ENCOUNTER — Ambulatory Visit
Admission: EM | Admit: 2022-04-28 | Discharge: 2022-04-28 | Disposition: A | Discharge status: Home / Self Care | Payer: Medicaid Other | Attending: Family Medicine | Admitting: Family Medicine

## 2022-04-28 ENCOUNTER — Encounter: Payer: Self-pay | Admitting: Emergency Medicine

## 2022-04-28 DIAGNOSIS — J329 Chronic sinusitis, unspecified: Secondary | ICD-10-CM | POA: Diagnosis not present

## 2022-04-28 DIAGNOSIS — B9789 Other viral agents as the cause of diseases classified elsewhere: Secondary | ICD-10-CM

## 2022-04-28 MED ORDER — PREDNISONE 20 MG PO TABS: 40.0000 mg | ORAL_TABLET | Freq: Every day | ORAL | 0 refills | Status: DC

## 2022-04-28 NOTE — ED Triage Notes (Addendum)
Pt reports headache, bilateral ear pressure, nasal congestion, and hoarseness x2 days. Pt denies any known fevers. Using otc nasal spray with some relief of symptoms.

## 2022-04-28 NOTE — ED Provider Notes (Signed)
[facility]    CSN: [id number] Arrival date & time: 04/28/22  1510      History   Chief Complaint Chief Complaint  Patient presents with   Headache    HPI Donna [name] is a 51 y.o. female.   Presenting today with 2-day history of headache, bilateral ear pressure, nasal congestion, sinus pressure and pain, hoarse voice.  Denies fever, chills, body aches, cough, chest pain, shortness of breath.  Trying over-the-counter nasal spray, Alka-Seltzer cold and sinus with mild relief of symptoms.  Does have a history of seasonal allergies on antihistamines daily.  Multiple sick contacts recently at work.   Past Medical History:  Diagnosis Date   Anemia    Contraceptive management 04/21/2014   Environmental allergies 12/30/2014   Herpes    History of anemia 05/05/2016   History of herpes simplex infection 04/21/2014   Irregular bleeding 05/05/2015   Itching in the vaginal area 05/05/2016   Retained tampon 9/56/3875   Umbilical hernia 6/43/3295   Vaginal discharge 05/05/2015    Patient Active Problem List   Diagnosis Date Noted   Vulvovaginitis 04/21/2022   Annual physical exam 01/21/2022   Atopic dermatitis in adult 01/21/2022   Seasonal allergies 01/21/2022   Caregiver stress 01/21/2022   Depression, major, single episode, mild ([place]) 01/21/2022   Trichimoniasis 10/15/2019   Vaginal odor 10/09/2019   Vaginal bleeding 10/09/2019   Screening examination for STD (sexually transmitted disease) 10/09/2019   Elevated BP without diagnosis of hypertension 10/09/2019   Iron deficiency anemia due to chronic blood loss 10/09/2019   Uterine fibroids 09/11/2017   Screening for colorectal cancer 06/27/2017   History of anemia 05/05/2016   Irregular bleeding 05/05/2015   Retained tampon 18/84/1660   Umbilical hernia 63/10/6008   Environmental allergies 12/30/2014   History of herpes simplex infection 04/21/2014    Past Surgical History:  Procedure Laterality Date    CERVICAL CERCLAGE  2010 and 2004   [employer] cerclage   CES[facility]AN SECTION  2012   [facility]   UMBILICAL HERNIA REPAIR  01/30/2012   Procedure: HERNIA REPAIR UMBILICAL ADULT;  Surgeon: Donato Heinz, MD;  Location: [facility];  Service: General;  Laterality: N/A;  With mesh    OB History     Gravida  5   Para  3   Term      Preterm      AB  2   Living  3      SAB  2   IAB      Ectopic      Multiple      Live Births               Home Medications    Prior to Admission medications   Medication Sig Start Date End Date Taking? Authorizing Provider  predniSONE (DELTASONE) 20 MG tablet Take 2 tablets (40 mg total) by mouth daily with breakfast. 04/28/22  Yes Volney American, PA-C  aspirin-sod bicarb-citric acid (ALKA-SELTZER) 325 MG TBEF tablet Take 325 mg by mouth once as needed (for headache).    [provider]  Cholecalciferol (VITAMIN D3) 25 MCG (1000 UT) CAPS Take 1 capsule (1,000 Units total) by mouth daily. 01/25/22   Renee Rival, FNP  ferrous sulfate 325 (65 FE) MG EC tablet Take 1 tablet (325 mg total) by mouth daily. 04/25/22   [employer], Dewaine Conger, FNP  fluticasone (FLONASE) 50 MCG/ACT nasal spray Place 1 spray into both nostrils daily.  [provider]  Multiple Vitamin (MULTIVITAMIN) tablet Take 1 tablet by mouth daily.    [provider]  norethindrone (HEATHER) 0.35 MG tablet Take 1 tablet (0.35 mg total) by mouth daily. 04/25/22 07/24/22  Janyth Pupa, DO  traZODone (DESYREL) 50 MG tablet Take 1 tablet (50 mg total) by mouth at bedtime. 04/21/22   Fayrene Helper, MD  triamcinolone cream (KENALOG) 0.1 % Apply 1 Application topically 2 (two) times daily. 04/11/22   P[employer], Dewaine Conger, FNP  valACYclovir (VALTREX) 1000 MG tablet Take 0.5 tablets (500 mg total) by mouth daily as needed (for outbreak). 12/16/20   Estill Dooms, NP  cetirizine (ZYRTEC) 10 MG tablet Take 1 tablet (10 mg total) by mouth daily as needed  for allergies. 09/18/20 02/02/21  Estill Dooms, NP    Family History Family History  Problem Relation Age of Onset   Diabetes Mother    Hypertension Mother    Diabetes Father    Hypertension Father    Multiple sclerosis Father    Asthma Daughter    Asthma Daughter    Cancer Maternal Grandfather        prostate   Anesthesia problems Neg Hx    Hypotension Neg Hx    Malignant hyperthermia Neg Hx    Pseudochol deficiency Neg Hx     Social History Social History   Tobacco Use   Smoking status: Never   Smokeless tobacco: Never  Vaping Use   Vaping Use: Never used  Substance Use Topics   Alcohol use: No   Drug use: No     Allergies   Sulfa antibiotics   Review of Systems Review of Systems Per HPI  Physical Exam Triage Vital Signs ED Triage Vitals [04/28/22 1554]  Enc Vitals Group     BP (!) 160/95     Pulse Rate 79     Resp 18     Temp 98.5 F (36.9 C)     Temp Source Oral     SpO2 98 %     Weight      Height      Head Circumference      Peak Flow      Pain Score 4     Pain Loc      Pain Edu?      Excl. in [place]?    No data found.  Updated Vital Signs BP (!) 160/95 (BP Location: Right Arm)   Pulse 79   Temp 98.5 F (36.9 C) (Oral)   Resp 18   SpO2 98%   Visual Acuity Right Eye Distance:   Left Eye Distance:   Bilateral Distance:    Right Eye Near:   Left Eye Near:    Bilateral Near:     Physical Exam Vitals and nursing note reviewed.  Constitutional:      Appearance: Normal appearance. She is not ill-appearing.  HENT:     Head: Atraumatic.     Comments: Bilateral middle ear effusions    Right Ear: External ear normal.     Left Ear: External ear normal.     Nose: Rhinorrhea present.     Mouth/Throat:     Mouth: Mucous membranes are moist.     Pharynx: Posterior oropharyngeal erythema present. No oropharyngeal exudate.  Eyes:     Extraocular Movements: Extraocular movements intact.     Conjunctiva/sclera: Conjunctivae normal.   Cardiovascular:     Rate and Rhythm: Normal rate and regular rhythm.  Heart sounds: Normal heart sounds.  Pulmonary:     Effort: Pulmonary effort is normal.     Breath sounds: Normal breath sounds. No wheezing or rales.  Musculoskeletal:        General: Normal range of motion.     Cervical back: Normal range of motion and neck supple.  Lymphadenopathy:     Cervical: No cervical adenopathy.  Skin:    General: Skin is warm and dry.  Neurological:     Mental Status: She is alert and oriented to person, place, and time.  Psychiatric:        Mood and Affect: Mood normal.        Thought Content: Thought content normal.        Judgment: Judgment normal.      UC Treatments / Results  Labs (all labs ordered are listed, but only abnormal results are displayed) Labs Reviewed - No data to display  EKG   Radiology No results found.  Procedures Procedures (including critical care time)  Medications Ordered in UC Medications - No data to display  Initial Impression / Assessment and Plan / UC Course  I have reviewed the triage vital signs and the nursing notes.  Pertinent labs & imaging results that were available during my care of the patient were reviewed by me and considered in my medical decision making (see chart for details).     Consistent with viral upper respiratory infection, treat with prednisone burst, nasal sprays, cold and congestion medications, sinus rinses.  Return for worsening symptoms.  Final Clinical Impressions(s) / UC Diagnoses   Final diagnoses:  Viral sinusitis   Discharge Instructions   None    ED Prescriptions     Medication Sig Dispense Auth. Provider   predniSONE (DELTASONE) 20 MG tablet Take 2 tablets (40 mg total) by mouth daily with breakfast. 10 tablet Volney American, [place]      PDMP not reviewed this encounter.   Volney American, [place] 04/28/22 1800

## 2022-06-02 ENCOUNTER — Ambulatory Visit: Payer: Medicaid Other | Admitting: Nurse Practitioner

## 2022-06-22 ENCOUNTER — Ambulatory Visit: Payer: Medicaid Other | Admitting: Internal Medicine

## 2022-07-29 ENCOUNTER — Ambulatory Visit (INDEPENDENT_AMBULATORY_CARE_PROVIDER_SITE_OTHER): Payer: Medicaid Other | Admitting: Internal Medicine

## 2022-07-29 ENCOUNTER — Encounter: Payer: Self-pay | Admitting: Internal Medicine

## 2022-07-29 DIAGNOSIS — B9689 Other specified bacterial agents as the cause of diseases classified elsewhere: Secondary | ICD-10-CM

## 2022-07-29 DIAGNOSIS — J019 Acute sinusitis, unspecified: Secondary | ICD-10-CM

## 2022-07-29 MED ORDER — AMOXICILLIN 875 MG PO TABS: 875.0000 mg | ORAL_TABLET | Freq: Two times a day (BID) | ORAL | 0 refills | Status: AC

## 2022-07-29 NOTE — Progress Notes (Signed)
   Acute Telephone Visit  Virtual Visit via Telephone Note  I connected with Donna [name] on 07/29/22 at  4:40 PM EDT by telephone and verified that I am speaking with the correct person using two identifiers.  Location: Patient: [address] [place], [place] [address] Provider: [phone] [address] [place],  [address]   I discussed the limitations, risks, security and privacy concerns of performing an evaluation and management service by telephone and the availability of in person appointments. I also discussed with the patient that there may be a patient responsible charge related to this service. The patient expressed understanding and agreed to proceed.   History of Present Illness:  Donna [name] was seen today for an acute telephone encounter to discuss respiratory symptoms.  She describes unilateral facial pain across her right maxillary and frontal sinuses, and a cough with sputum production.  The symptoms have been present for multiple weeks and have worsened within the last week.  Yesterday she noted flecks of blood in her sputum.  This has resolved and she has not noted any blood in her sputum today.  She endorses chills but has not checked her temperature.  Donna [name] has been taking Alka-Seltzer cold and Mucinex for symptom relief.  She is interested in additional treatment options today.  Assessment and Plan:  Acute bacterial sinusitis She reports a multiweek history of sinus congestion that has progressed to worsening unilateral sinus pain/pressure.  Her sputum is now green/brown.  She endorses chills but has not checked her temperature.  Given the persistence of her symptoms that are now worsening, I will prescribe amoxicillin 875 mg twice daily x5 days.  She was instructed to continue current supportive care measures.  I additionally recommended use of saline rinse followed by fluticasone.  She will contact our office for further recommendations if her symptoms or not  improving by late next week.  Follow Up Instructions:  I discussed the assessment and treatment plan with the patient. The patient was provided an opportunity to ask questions and all were answered. The patient agreed with the plan and demonstrated an understanding of the instructions.   The patient was advised to call back or seek an in-person evaluation if the symptoms worsen or if the condition fails to improve as anticipated.  I provided 9 minutes of non-face-to-face time during this encounter.   Johnette Abraham, MD

## 2022-08-08 ENCOUNTER — Other Ambulatory Visit (HOSPITAL_COMMUNITY): Payer: Self-pay

## 2022-08-08 ENCOUNTER — Other Ambulatory Visit: Payer: Self-pay

## 2022-08-08 DIAGNOSIS — L209 Atopic dermatitis, unspecified: Secondary | ICD-10-CM

## 2022-08-08 MED ORDER — TRIAMCINOLONE ACETONIDE 0.1 % EX CREA
1.0000 | TOPICAL_CREAM | Freq: Two times a day (BID) | CUTANEOUS | 0 refills | Status: DC
  Filled 2022-08-08: qty 30, 30d supply, fill #0

## 2022-08-16 ENCOUNTER — Ambulatory Visit
Admission: EM | Admit: 2022-08-16 | Discharge: 2022-08-16 | Disposition: A | Discharge status: Home / Self Care | Payer: Medicaid Other | Attending: Nurse Practitioner | Admitting: Nurse Practitioner

## 2022-08-16 DIAGNOSIS — M549 Dorsalgia, unspecified: Secondary | ICD-10-CM | POA: Diagnosis not present

## 2022-08-16 DIAGNOSIS — A084 Viral intestinal infection, unspecified: Secondary | ICD-10-CM | POA: Diagnosis not present

## 2022-08-16 LAB — POCT URINALYSIS DIP (MANUAL ENTRY)
Bilirubin, UA: NEGATIVE
Glucose, UA: NEGATIVE mg/dL
Ketones, POC UA: NEGATIVE mg/dL
Leukocytes, UA: NEGATIVE
Nitrite, UA: NEGATIVE
Protein Ur, POC: NEGATIVE mg/dL
Spec Grav, UA: 1.01 (ref 1.010–1.025)
Urobilinogen, UA: 0.2 E.U./dL
pH, UA: 6 (ref 5.0–8.0)

## 2022-08-16 NOTE — ED Provider Notes (Signed)
[facility]    CSN: [id number] Arrival date & time: 08/16/22  1629      History   Chief Complaint Chief Complaint  Patient presents with   Diarrhea    HPI Donna [name] is a 51 y.o. female.   Patient presents for 2 days of abdominal pain.  Reports abdominal pain began 5 hours after eating at a cookout.  Reports last night, diarrhea started while she was at work.  Describes the pain as abdominal cramping.  The pain is moderate when it comes.  She has not had any abdominal pain today and does not have any currently.  Reports diarrhea subsided overnight last night.  She denies fever, nausea/vomiting, change in appetite, constipation, blood in her stool, heartburn, rash, dysuria/urinary frequency, hematuria, frequent NSAID use.  She has taken ibuprofen for the pain which did help temporarily.  She also endorses little bit of right side pain that began today.  Reports over the weekend, she did a lot of dancing which she has not used to and thinks this may have caused the pain.    Past Medical History:  Diagnosis Date   Anemia    Contraceptive management 04/21/2014   Environmental allergies 12/30/2014   Herpes    History of anemia 05/05/2016   History of herpes simplex infection 04/21/2014   Irregular bleeding 05/05/2015   Itching in the vaginal area 05/05/2016   Retained tampon 11/29/2540   Umbilical hernia 04/15/2375   Vaginal discharge 05/05/2015    Patient Active Problem List   Diagnosis Date Noted   Vulvovaginitis 04/21/2022   Annual physical exam 01/21/2022   Atopic dermatitis in adult 01/21/2022   Seasonal allergies 01/21/2022   Caregiver stress 01/21/2022   Depression, major, single episode, mild ([place]) 01/21/2022   Trichimoniasis 10/15/2019   Vaginal odor 10/09/2019   Vaginal bleeding 10/09/2019   Screening examination for STD (sexually transmitted disease) 10/09/2019   Elevated BP without diagnosis of hypertension 10/09/2019   Iron deficiency anemia due to  chronic blood loss 10/09/2019   Uterine fibroids 09/11/2017   Screening for colorectal cancer 06/27/2017   History of anemia 05/05/2016   Irregular bleeding 05/05/2015   Retained tampon 28/31/5176   Umbilical hernia 16/04/3709   Environmental allergies 12/30/2014   History of herpes simplex infection 04/21/2014    Past Surgical History:  Procedure Laterality Date   CERVICAL CERCLAGE  2010 and 2004   [employer] cerclage   [facility]AN SECTION  2012   [facility]   UMBILICAL HERNIA REPAIR  01/30/2012   Procedure: HERNIA REPAIR UMBILICAL ADULT;  Surgeon: Donato Heinz, MD;  Location: [facility];  Service: General;  Laterality: N/A;  With mesh    OB History     Gravida  5   Para  3   Term      Preterm      AB  2   Living  3      SAB  2   IAB      Ectopic      Multiple      Live Births               Home Medications    Prior to Admission medications   Medication Sig Start Date End Date Taking? Authorizing Provider  aspirin-sod bicarb-citric acid (ALKA-SELTZER) 325 MG TBEF tablet Take 325 mg by mouth once as needed (for headache).    [provider]  Cholecalciferol (VITAMIN D3) 25 MCG (1000 UT) CAPS Take 1 capsule (1,000  Units total) by mouth daily. 01/25/22   Renee Rival, FNP  ferrous sulfate 325 (65 FE) MG EC tablet Take 1 tablet (325 mg total) by mouth daily. 04/25/22   [employer], Dewaine Conger, FNP  fluticasone (FLONASE) 50 MCG/ACT nasal spray Place 1 spray into both nostrils daily.    [provider]  Multiple Vitamin (MULTIVITAMIN) tablet Take 1 tablet by mouth daily.    [provider]  norethindrone (HEATHER) 0.35 MG tablet Take 1 tablet (0.35 mg total) by mouth daily. 04/25/22 07/24/22  Janyth Pupa, DO  traZODone (DESYREL) 50 MG tablet Take 1 tablet (50 mg total) by mouth at bedtime. 04/21/22   Fayrene Helper, MD  triamcinolone cream (KENALOG) 0.1 % Apply 1 Application topically 2 (two) times daily. 08/08/22   Alvira Monday,  FNP  valACYclovir (VALTREX) 1000 MG tablet Take 0.5 tablets (500 mg total) by mouth daily as needed (for outbreak). 12/16/20   Estill Dooms, NP  cetirizine (ZYRTEC) 10 MG tablet Take 1 tablet (10 mg total) by mouth daily as needed for allergies. 09/18/20 02/02/21  Estill Dooms, NP    Family History Family History  Problem Relation Age of Onset   Diabetes Mother    Hypertension Mother    Diabetes Father    Hypertension Father    Multiple sclerosis Father    Asthma Daughter    Asthma Daughter    Cancer Maternal Grandfather        prostate   Anesthesia problems Neg Hx    Hypotension Neg Hx    Malignant hyperthermia Neg Hx    Pseudochol deficiency Neg Hx     Social History Social History   Tobacco Use   Smoking status: Never   Smokeless tobacco: Never  Vaping Use   Vaping Use: Never used  Substance Use Topics   Alcohol use: No   Drug use: Never     Allergies   Sulfa antibiotics   Review of Systems Review of Systems Per HPI  Physical Exam Triage Vital Signs ED Triage Vitals [08/16/22 1636]  Enc Vitals Group     BP 134/86     Pulse Rate 92     Resp 18     Temp 99 F (37.2 C)     Temp Source Oral     SpO2 97 %     Weight      Height      Head Circumference      Peak Flow      Pain Score 4     Pain Loc      Pain Edu?      Excl. in [place]?    No data found.  Updated Vital Signs BP 134/86 (BP Location: Right Arm)   Pulse 92   Temp 99 F (37.2 C) (Oral)   Resp 18   SpO2 97%   Visual Acuity Right Eye Distance:   Left Eye Distance:   Bilateral Distance:    Right Eye Near:   Left Eye Near:    Bilateral Near:     Physical Exam Vitals and nursing note reviewed.  Constitutional:      General: She is not in acute distress.    Appearance: Normal appearance. She is not toxic-appearing.  HENT:     Head: Normocephalic and atraumatic.     Mouth/Throat:     Mouth: Mucous membranes are moist.     Pharynx: Oropharynx is clear.  Eyes:      General: No scleral icterus.  Extraocular Movements: Extraocular movements intact.  Cardiovascular:     Rate and Rhythm: Normal rate and regular rhythm.  Pulmonary:     Effort: Pulmonary effort is normal. No respiratory distress.     Breath sounds: Normal breath sounds. No wheezing, rhonchi or rales.  Abdominal:     General: Abdomen is flat. Bowel sounds are normal. There is no distension.     Palpations: Abdomen is soft. There is no mass.     Tenderness: There is no abdominal tenderness. There is no right CVA tenderness, left CVA tenderness or guarding.  Musculoskeletal:       Arms:     Cervical back: Normal range of motion.     Comments: Tender to palpation in approximately area marked.  No erythema, swelling, bruising, obvious deformity.  Lymphadenopathy:     Cervical: No cervical adenopathy.  Skin:    General: Skin is warm and dry.     Capillary Refill: Capillary refill takes less than 2 seconds.     Coloration: Skin is not jaundiced or pale.     Findings: No erythema.  Neurological:     Mental Status: She is alert and oriented to person, place, and time.  Psychiatric:        Behavior: Behavior is cooperative.      UC Treatments / Results  Labs (all labs ordered are listed, but only abnormal results are displayed) Labs Reviewed  POCT URINALYSIS DIP (MANUAL ENTRY) - Abnormal; Notable for the following components:      Result Value   Blood, UA trace-intact (*)    All other components within normal limits    EKG   Radiology No results found.  Procedures Procedures (including critical care time)  Medications Ordered in UC Medications - No data to display  Initial Impression / Assessment and Plan / UC Course  I have reviewed the triage vital signs and the nursing notes.  Pertinent labs & imaging results that were available during my care of the patient were reviewed by me and considered in my medical decision making (see chart for details).   Patient is  well-appearing, normotensive, afebrile, not tachycardic, not tachypneic, oxygenating well on room air.   Viral gastroenteritis Suspect viral gastroenteritis as cause of abdominal pain and diarrhea No red flags in history or on examination Supportive care discussed ER and return precautions discussed Note given for work  Other acute back pain Urinalysis shows trace blood, I think this is an incidental finding and not related to the pain on the right side of her rib cage No CVA tenderness on examination, vital signs are stable Suspect musculoskeletal cause as cause of pain-encouraged use of Tylenol/NSAIDs as needed ER and return precautions discussed Note given for work  The patient was given the opportunity to ask questions.  All questions answered to their satisfaction.  The patient is in agreement to this plan.   Final Clinical Impressions(s) / UC Diagnoses   Final diagnoses:  Viral gastroenteritis  Other acute back pain     Discharge Instructions      Your symptoms are consistent with a stomach bug. The urinalysis today shows a very small amount of blood which I do not think is related to any kidney infection today.    The diarrhea should improve over the next couple of days.  Make sure you are drinking plenty of fluids.  Eat foods that are easy to digest to give your stomach a little bit of a rest.   The pain  in your side is likely musculoskeletal.  You can take Tylenol [phone] mg every 6 hours as needed for the pain.  Please also do light stretching/range of motion exercises.     ED Prescriptions   None    PDMP not reviewed this encounter.   Eulogio Bear, NP 08/16/22 1710

## 2022-08-16 NOTE — ED Triage Notes (Signed)
Pt reports diarrhea and right lower quadrant abdominal pain x 2 days after a cookout. Reports she started feeling better today.

## 2022-08-16 NOTE — Discharge Instructions (Addendum)
Your symptoms are consistent with a stomach bug. The urinalysis today shows a very small amount of blood which I do not think is related to any kidney infection today.    The diarrhea should improve over the next couple of days.  Make sure you are drinking plenty of fluids.  Eat foods that are easy to digest to give your stomach a little bit of a rest.   The pain in your side is likely musculoskeletal.  You can take Tylenol [phone] mg every 6 hours as needed for the pain.  Please also do light stretching/range of motion exercises.

## 2022-08-18 ENCOUNTER — Encounter: Payer: Self-pay | Admitting: Family Medicine

## 2022-08-18 ENCOUNTER — Ambulatory Visit: Payer: Medicaid Other

## 2022-08-18 ENCOUNTER — Ambulatory Visit (INDEPENDENT_AMBULATORY_CARE_PROVIDER_SITE_OTHER): Payer: Medicaid Other | Admitting: Family Medicine

## 2022-08-18 DIAGNOSIS — A084 Viral intestinal infection, unspecified: Secondary | ICD-10-CM | POA: Diagnosis not present

## 2022-08-18 NOTE — Progress Notes (Signed)
Virtual Visit via Telephone Note   This visit type was conducted via telephone. This format is felt to be most appropriate for this patient at this time.  The patient did not have access to video technology/had technical difficulties with video requiring transitioning to audio format only (telephone).  All issues noted in this document were discussed and addressed.  No physical exam could be performed with this format.  Evaluation Performed:  Follow-up visit  Date:  08/18/2022   ID:  Donna [name], Donna [name] 1971-08-18, MRN [id number]  Patient Location: Home Provider Location: Clinic  Participants: Patient Location of Patient: Home Location of Provider: Clinic Consent was obtain for visit to be over via telehealth. I verified that I am speaking with the correct person using two identifiers.  PCP:  Alvira Monday, FNP   Chief Complaint:  diarrhea  History of Present Illness:    Donna [name] is a 51 y.o. female with complaints of viral gastroenteritis.  Onset of symptoms on 08/14/2022. She complains of abdominal pain, with diarrhea.  She denies fever, chills, nausea, vomiting, mucus, and blood in her stool.  She would like a work note excusing her from work today and tomorrow as she  still haa diarrhea.  The patient does not have symptoms concerning for COVID-19 infection (fever, chills, cough, or new shortness of breath).   Past Medical, Surgical, Social History, Allergies, and Medications have been Reviewed.  Past Medical History:  Diagnosis Date   Anemia    Contraceptive management 04/21/2014   Environmental allergies 12/30/2014   Herpes    History of anemia 05/05/2016   History of herpes simplex infection 04/21/2014   Irregular bleeding 05/05/2015   Itching in the vaginal area 05/05/2016   Retained tampon 5/99/3570   Umbilical hernia 1/77/9390   Vaginal discharge 05/05/2015   Past Surgical History:  Procedure Laterality Date   CERVICAL CERCLAGE  2010 and 2004    [employer] cerclage   [facility] SECTION  2012   [facility]   UMBILICAL HERNIA REPAIR  01/30/2012   Procedure: HERNIA REPAIR UMBILICAL ADULT;  Surgeon: Donato Heinz, MD;  Location: [facility];  Service: General;  Laterality: N/A;  With mesh     Current Meds  Medication Sig   aspirin-sod bicarb-citric acid (ALKA-SELTZER) 325 MG TBEF tablet Take 325 mg by mouth once as needed (for headache).   Cholecalciferol (VITAMIN D3) 25 MCG (1000 UT) CAPS Take 1 capsule (1,000 Units total) by mouth daily.   ferrous sulfate 325 (65 FE) MG EC tablet Take 1 tablet (325 mg total) by mouth daily.   fluticasone (FLONASE) 50 MCG/ACT nasal spray Place 1 spray into both nostrils daily.   Multiple Vitamin (MULTIVITAMIN) tablet Take 1 tablet by mouth daily.   traZODone (DESYREL) 50 MG tablet Take 1 tablet (50 mg total) by mouth at bedtime.   triamcinolone cream (KENALOG) 0.1 % Apply 1 Application topically 2 (two) times daily.   valACYclovir (VALTREX) 1000 MG tablet Take 0.5 tablets (500 mg total) by mouth daily as needed (for outbreak).     Allergies:   Sulfa antibiotics   ROS:   Please see the history of present illness.     All other systems reviewed and are negative.   Labs/Other Tests and Data Reviewed:    Recent Labs: 01/24/2022: ALT 15; BUN 14; Creatinine, Ser 0.92; Hemoglobin 12.9; Platelets 313; Potassium 4.3; Sodium 143; TSH 1.080   Recent Lipid Panel Lab Results  Component Value Date/Time   CHOL 163  01/24/2022 03:01 PM   TRIG 147 01/24/2022 03:01 PM   HDL 50 01/24/2022 03:01 PM   CHOLHDL 3.3 01/24/2022 03:01 PM   CHOLHDL 2.6 06/25/2014 09:45 AM   LDLCALC 87 01/24/2022 03:01 PM    Wt Readings from Last 3 Encounters:  04/21/22 159 lb 0.6 oz (72.1 kg)  01/21/22 154 lb (69.9 kg)  10/04/21 130 lb (59 kg)     Objective:    Vital Signs:  There were no vitals taken for this visit.     ASSESSMENT & PLAN:   Viral gastroenteritis Encouraged supportive care and oral rehydration  therapy Encouraged to increase her intake of crackers, bananas, cereal, and soups Encouraged to avoid high fat foods until the gut function returns to normal A work note will be provided excusing the patient from work today and tomorrow Time:   Today, I have spent 12 minutes reviewing the chart, including problem list, medications, and with the patient with telehealth technology discussing the above problems.   Medication Adjustments/Labs and Tests Ordered: Current medicines are reviewed at length with the patient today.  Concerns regarding medicines are outlined above.   Tests Ordered: No orders of the defined types were placed in this encounter.   Medication Changes: No orders of the defined types were placed in this encounter.    Note: This dictation was prepared with Dragon dictation along with smaller phrase technology. Similar sounding words can be transcribed inadequately or may not be corrected upon review. Any transcriptional errors that result from this process are unintentional.      Disposition:  Follow up  Signed, Alvira Monday, FNP  08/18/2022 10:35 AM     [place]

## 2022-08-22 ENCOUNTER — Other Ambulatory Visit (HOSPITAL_COMMUNITY): Payer: Self-pay

## 2022-08-26 ENCOUNTER — Other Ambulatory Visit (HOSPITAL_COMMUNITY): Payer: Self-pay

## 2022-09-13 ENCOUNTER — Encounter: Payer: Self-pay | Admitting: Orthopedic Surgery

## 2022-09-13 ENCOUNTER — Ambulatory Visit: Payer: Medicaid Other | Admitting: Orthopedic Surgery

## 2022-09-13 VITALS — BP 139/94 | HR 83 | Ht 61.0 in | Wt 159.0 lb

## 2022-09-13 DIAGNOSIS — M25562 Pain in left knee: Secondary | ICD-10-CM | POA: Diagnosis not present

## 2022-09-13 MED ORDER — METHYLPREDNISOLONE ACETATE 40 MG/ML IJ SUSP
40.0000 mg | Freq: Once | INTRAMUSCULAR | Status: AC
  Administered 2022-09-13: 40 mg via INTRA_ARTICULAR

## 2022-09-13 NOTE — Addendum Note (Signed)
Addended by: Elizabeth Sauer on: 09/13/2022 03:52 PM   Modules accepted: Orders

## 2022-09-13 NOTE — Progress Notes (Signed)
Orthopaedic Clinic Return  Assessment: Donna [name] is a 51 y.o. female with the following: Acute left knee pain, mild right knee pain  Plan: Donna [name] has had pain in the left knee intermittently for the past couple of years.  She had a recent fall, and has had worsening of the pain in the left knee.  She has bilateral varus alignment.  Just putting more stress to the medial compartment.  Her current pain, could be an exacerbation.  There could also be an underlying injury.  I recommended an injection, which was completed in clinic today without issues.  I am also recommending an MRI of the left knee, which we coordinated through [employer] [facility].  Will follow-up once imaging is complete.  Continue with brace, and weightbearing as tolerated.  Procedure note injection Left knee joint   Verbal consent was obtained to inject the left knee joint  Timeout was completed to confirm the site of injection.  The skin was prepped with alcohol and ethyl chloride was sprayed at the injection site.  A 21-gauge needle was used to inject 40 mg of Depo-Medrol and 1% lidocaine (3 cc) into the left knee using an anterolateral approach.  There were no complications. A sterile bandage was applied.     Meds ordered this encounter  Medications   methylPREDNISolone acetate (DEPO-MEDROL) injection 40 mg    Body mass index is 30.04 kg/m.  Follow-up: Return for After MRI.   Subjective:  Chief Complaint  Patient presents with   Knee Pain    Pt had a fall a work on 08/29/22 that caused bilat knee pain. Starting to have more pain in the Lt knee and continued pain in the rt knee     History of Present Illness: Donna [name] is a 51 y.o. female who returns to clinic for evaluation of left knee pain.  I have seen her in the past for pain in the left knee.  However, she reports approximately 2 weeks ago, that she fell at work.  She slipped on a wet floor, and hit her left knee, as well as  injuring her right knee.  Left is worse than the right.  She has been wearing a brace.  Pain is worst in the anterior and medial aspect of the knee.  Occasional buckling sensations.  Review of Systems: No fevers or chills No numbness or tingling No chest pain No shortness of breath No bowel or bladder dysfunction No GI distress No headaches   Objective: BP (!) 139/94   Pulse 83   Ht '5\' 1"'$  (1.549 m)   Wt 159 lb (72.1 kg)   BMI 30.04 kg/m   Physical Exam:  Bilateral knees with varus alignment.  Mild effusion of the left knee.  Tenderness palpation of the tibial tubercle, as well as the Pez anserine tendons and medial joint line.  No increased laxity varus valgus stress.  Negative Lachman.  No obvious bruising.  IMAGING: I personally ordered and reviewed the following images:  X-rays from the urgent care center were available in clinic today.  No acute injuries are noted.  Varus alignment overall.  Loss of joint space within the medial compartment.   Mordecai Rasmussen, MD 09/13/2022 11:31 AM

## 2022-09-13 NOTE — Patient Instructions (Signed)

## 2022-09-15 ENCOUNTER — Telehealth: Payer: Self-pay | Admitting: Radiology

## 2022-09-15 NOTE — Telephone Encounter (Signed)
I am trying to get approval for MRI but need patients WC information Is someone working on getting this information in Epic, I do not see it yet in the system. Please advise

## 2022-09-29 ENCOUNTER — Ambulatory Visit: Payer: Medicaid Other | Admitting: Family Medicine

## 2022-10-04 ENCOUNTER — Ambulatory Visit: Payer: Medicaid Other | Admitting: Family Medicine

## 2022-10-06 ENCOUNTER — Ambulatory Visit: Payer: Medicaid Other | Admitting: Family Medicine

## 2022-10-06 ENCOUNTER — Encounter: Payer: Self-pay | Admitting: Family Medicine

## 2022-10-18 ENCOUNTER — Telehealth: Payer: Self-pay | Admitting: Orthopedic Surgery

## 2022-10-18 NOTE — Telephone Encounter (Signed)
Danita from [employer] number [phone]  called and left a voicemail about this patient stating she had her MRI done yesterday and she needs a appt to review the MRI results.  I made the appt with her and she is to let the patient know.  She states the patient has a CD with her and will bring it when she comes for her appt.  I asked about the report and she said she does not have the report.  I told her we need that too.  She is going to contact the hospital where she had the MRI done, she states she doesn't have a copy of the report either, she is going to get one for both of [place].     REVIEW MRI RESULTS ** PATIENT TO BRING CD WITH HER ** SHE HAD THE MRI DONE ON 10/17/22 MADE APPT WITH DANITA FROM [phone]

## 2022-10-20 ENCOUNTER — Telehealth: Payer: Self-pay

## 2022-10-20 NOTE — Telephone Encounter (Signed)
No charge for her form. It was so short.  I completed it will bring down to pickup box and she will get it tomorrow.
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
## 2022-10-25 ENCOUNTER — Ambulatory Visit (INDEPENDENT_AMBULATORY_CARE_PROVIDER_SITE_OTHER): Admitting: Orthopedic Surgery

## 2022-10-25 ENCOUNTER — Encounter: Payer: Self-pay | Admitting: Orthopedic Surgery

## 2022-10-25 DIAGNOSIS — M25562 Pain in left knee: Secondary | ICD-10-CM

## 2022-10-25 NOTE — Patient Instructions (Signed)
Brace for the left knee and use a walker to assist with ambulation for the next 6 weeks.

## 2022-10-25 NOTE — Progress Notes (Signed)
Orthopaedic Clinic Return  Assessment: Donna [name] is a 52 y.o. female with the following: Left knee pain, recent MRI with increased signal within the medial tibial plateau.  Plan: Mrs. Donna [name] has had pain in the left knee intermittently for the past couple of years.  She sustained a fall at work recently.  Injection has not sufficiently improved her pain.  On MRI today, there is increased signal within the medial tibial plateau.  There is also advanced degenerative changes within the medial compartment.  The increased signal within the plateau is consistent with a physical exam.  As result, I am recommending use of a brace, and protected weightbearing for the next 6 weeks.  She states her understanding.  I will see her in 6 weeks for repeat evaluation.   Follow-up: Return in about 6 weeks (around 12/06/2022).   Subjective:  Chief Complaint  Patient presents with   Follow-up    Recheck on left knee, MRI results.    History of Present Illness: Donna [name] is a 51 y.o. female who returns to clinic for evaluation of left knee pain.  She sustained a fall at work recently.  Injection did not provide sustained relief.  We obtained an MRI.  She continues to have pain in the left knee.  She is not using an assistive device.  She is taking medicines as needed.   Review of Systems: No fevers or chills No numbness or tingling No chest pain No shortness of breath No bowel or bladder dysfunction No GI distress No headaches   Objective: There were no vitals taken for this visit.  Physical Exam:  Left knee with varus alignment.  No effusion.  Mild tenderness palpation along the medial joint line.  Tenderness to palpation over the medial tibial plateau.  She has good range of motion.  Good strength.  No increased laxity varus valgus stress.  Negative Lachman.  IMAGING: I personally ordered and reviewed the following images:  Left knee MRI, completed  elsewhere    Impression:  Tricompartment osteoarthritis, worst in the medial compartment with areas of intermediate high-grade cartilage loss along the weightbearing surfaces  Degenerative tearing of the medial meniscus body with substance loss.  Free edge fraying of the posterior horn.  Intact cruciate and collateral ligaments.  Small Baker's cyst.   Mordecai Rasmussen, MD 10/25/2022 4:32 PM

## 2022-10-31 ENCOUNTER — Telehealth: Payer: Self-pay | Admitting: Orthopedic Surgery

## 2022-10-31 NOTE — Telephone Encounter (Signed)
Patient called, stating that she needs to be out of work for six weeks from her appt on 10/25/22.  She needs a note, please advise if one can be given.  Pt's # [phone]

## 2022-11-02 NOTE — Telephone Encounter (Signed)
Out of work note extended to next appointment.

## 2022-11-03 ENCOUNTER — Telehealth: Payer: Self-pay | Admitting: Orthopedic Surgery

## 2022-11-03 NOTE — Telephone Encounter (Signed)
Donna [name],  The patient has talked with everyone in the office and Abigail Butts had a voicemail from her.  She came in the office stating she can't find someone to do her walker as workers comp and Abigail Butts was in the consult office, I went and spoke with Abigail Butts about it and she tried a couple of places and they said no. She said the patient might need to contact her adjuster for workers comp and see what they said.   I gave the patient her note and made a copy for her and she has the RX for her walker, I gave her Elbert Ewings folder to put all her paper work in.   Can you please call her and see what she actually needs now.  Thank You!!!

## 2022-11-09 NOTE — Telephone Encounter (Signed)
Spoke with pt last week, feels all of her concerns have been addressed at this point. Pt had no further questions at the time of call.

## 2022-11-10 ENCOUNTER — Telehealth: Payer: Self-pay | Admitting: Radiology

## 2022-11-10 ENCOUNTER — Encounter: Payer: Self-pay | Admitting: Orthopedic Surgery

## 2022-11-10 NOTE — Telephone Encounter (Signed)
W. Comp information still not loaded in system. After locating information called and verified information needed and number, fax sent to [employer] [phone])

## 2022-11-10 NOTE — Telephone Encounter (Signed)
I had another VM from [place] at [employer].  She is requesting a Rx for walker.  I am unsure if maybe patient already has it at this point?   Also needs a work note signed by the provider not by the [place].

## 2022-11-10 NOTE — Telephone Encounter (Signed)
[name] ph [phone].

## 2022-11-24 ENCOUNTER — Encounter: Payer: Self-pay | Admitting: Orthopedic Surgery

## 2022-12-06 ENCOUNTER — Encounter: Payer: Self-pay | Admitting: Orthopedic Surgery

## 2022-12-06 ENCOUNTER — Ambulatory Visit: Admitting: Orthopedic Surgery

## 2022-12-06 VITALS — Ht 61.0 in | Wt 159.0 lb

## 2022-12-06 DIAGNOSIS — M25562 Pain in left knee: Secondary | ICD-10-CM

## 2022-12-06 DIAGNOSIS — G8929 Other chronic pain: Secondary | ICD-10-CM

## 2022-12-06 NOTE — Patient Instructions (Addendum)
Continue to use the brace  Use the walker at all times to assist with ambulation  Follow-up in 1 month  We will place a referral for physical therapy, and hope to have this scheduled by the time you are seen in clinic in 1 month  Out of work until next appointment, please provide a note

## 2022-12-06 NOTE — Progress Notes (Signed)
Orthopaedic Clinic Return  Assessment: Donna [name] is a 52 y.o. female with the following: Left knee pain, recent MRI with increased signal within the medial tibial plateau.  Plan: Donna [name] continues to have pain in her left knee.  The brace has been helpful.  She states the walker has also been helpful, but she did not get this until about a week ago.  As such, I am still recommending a period of protected weightbearing.  Will see her back in a month.  She is to wear the brace, and use the walker at all times.  Will get her into therapy after the next appointment.  Due to the fact that things have been delayed with [employer], we will place the referral for therapy now.  Hopefully this will coincide with her return visit.  Follow-up: Return in about 4 weeks (around 01/03/2023).   Subjective:  Chief Complaint  Patient presents with   Knee Pain    Lt knee pain    History of Present Illness: Donna [name] is a 52 y.o. female who returns to clinic for evaluation of left knee pain.  She sustained a fall at work.  At the last visit, we reviewed the MRI which demonstrates advanced degenerative changes, with some increased signal within the medial tibial plateau.  At that point, I recommended a brace, as well as a walker for protected weightbearing.  She has been wearing the brace, and does help.  However, she did not get her walker until 1-2 weeks ago.  The delay was in trying to get this set up with [employer].  She thinks the walker has been helping.  She continues medications.  She continues to have pain.  Review of Systems: No fevers or chills No numbness or tingling No chest pain No shortness of breath No bowel or bladder dysfunction No GI distress No headaches   Objective: Ht '5\' 1"'$  (1.549 m)   Wt 159 lb (72.1 kg)   BMI 30.04 kg/m   Physical Exam:  Left knee with varus alignment.  No effusion.  Mild tenderness palpation along the medial joint  line.  Tenderness to palpation over the medial tibial plateau.  She has good range of motion.  Good strength.  No increased laxity varus valgus stress.  Negative Lachman.  The brace is fitting appropriately.  Continues to have tenderness along the medial joint line, as well as the medial tibial plateau.  IMAGING: I personally ordered and reviewed the following images:  Left knee MRI, completed elsewhere    Impression:  Tricompartment osteoarthritis, worst in the medial compartment with areas of intermediate high-grade cartilage loss along the weightbearing surfaces  Degenerative tearing of the medial meniscus body with substance loss.  Free edge fraying of the posterior horn.  Intact cruciate and collateral ligaments.  Small Baker's cyst.   Mordecai Rasmussen, MD 12/06/2022 11:36 AM

## 2022-12-08 ENCOUNTER — Encounter: Payer: Self-pay | Admitting: Radiology

## 2023-01-04 ENCOUNTER — Ambulatory Visit: Payer: Medicaid Other | Admitting: Orthopedic Surgery

## 2023-01-10 ENCOUNTER — Ambulatory Visit (INDEPENDENT_AMBULATORY_CARE_PROVIDER_SITE_OTHER): Admitting: Orthopedic Surgery

## 2023-01-10 ENCOUNTER — Encounter: Payer: Self-pay | Admitting: Orthopedic Surgery

## 2023-01-10 DIAGNOSIS — G8929 Other chronic pain: Secondary | ICD-10-CM

## 2023-01-10 DIAGNOSIS — M1712 Unilateral primary osteoarthritis, left knee: Secondary | ICD-10-CM

## 2023-01-10 DIAGNOSIS — M25562 Pain in left knee: Secondary | ICD-10-CM | POA: Diagnosis not present

## 2023-01-11 ENCOUNTER — Encounter: Payer: Self-pay | Admitting: Orthopedic Surgery

## 2023-01-11 NOTE — Progress Notes (Signed)
Orthopaedic Clinic Return  Assessment: Donna [name] is a 52 y.o. female with the following: Left knee pain, recent MRI with increased signal within the medial tibial plateau.  Plan: Donna [name] continues to have pain in her left knee.  She is doing better with time, and physical therapy.  However, she continues to have occasional sharp pains.  She does not feel as though she could return to work at this time.  Okay for her to stop using a walker.  Recommend she continues the brace.  Will place another referral to physical therapy.  I injected her left knee in clinic today.  She has an acute on chronic problem.  The arthritis is not caused by her recent fall, but the bone bruising could be a result of the fall.  This takes time to heal.  I will see her back in approximately 6 weeks for repeat evaluation.  She will call the clinic if she has issues before then.  Procedure note injection Left knee joint   Verbal consent was obtained to inject the left knee joint  Timeout was completed to confirm the site of injection.  The skin was prepped with alcohol and ethyl chloride was sprayed at the injection site.  A 21-gauge needle was used to inject 40 mg of Depo-Medrol and 1% lidocaine (3 cc) into the left knee using an anterolateral approach.  There were no complications. A sterile bandage was applied.     Follow-up: Return in about 6 weeks (around 02/21/2023).   Subjective:  Chief Complaint  Patient presents with   Follow-up    Recheck on left knee  NDC: [id number]    History of Present Illness: Donna [name] is a 52 y.o. female who returns to clinic for evaluation of left knee pain.  She sustained a fall at work.  I saw her a month ago.  Since then, she has been working with physical therapy.  She has been using a walker to assist with ambulation most of the time.  She continues to use a brace.  Therapy has helped.  Time is helpful.  However, she continues to have occasional sharp  pains in her left knee.  She has not returned to work.   Review of Systems: No fevers or chills No numbness or tingling No chest pain No shortness of breath No bowel or bladder dysfunction No GI distress No headaches   Objective: There were no vitals taken for this visit.  Physical Exam:  Left knee with varus alignment.  No effusion.  Mild tenderness palpation along the medial joint line.  Tenderness to palpation over the medial tibial plateau.  She has good range of motion.  Good strength.  No increased laxity varus valgus stress.  Negative Lachman.  The brace is fitting appropriately.    IMAGING: I personally ordered and reviewed the following images:  Left knee MRI, completed elsewhere    Impression:  Tricompartment osteoarthritis, worst in the medial compartment with areas of intermediate high-grade cartilage loss along the weightbearing surfaces  Degenerative tearing of the medial meniscus body with substance loss.  Free edge fraying of the posterior horn.  Intact cruciate and collateral ligaments.  Small Baker's cyst.   Mordecai Rasmussen, MD 01/11/2023 9:44 AM

## 2023-01-23 ENCOUNTER — Ambulatory Visit: Payer: Medicaid Other | Admitting: Nurse Practitioner

## 2023-01-24 ENCOUNTER — Ambulatory Visit: Payer: Medicaid Other | Admitting: Family Medicine

## 2023-01-24 ENCOUNTER — Ambulatory Visit: Payer: Medicaid Other | Admitting: Nurse Practitioner

## 2023-02-06 ENCOUNTER — Ambulatory Visit: Payer: Medicaid Other | Admitting: Family Medicine

## 2023-02-21 ENCOUNTER — Encounter: Payer: Self-pay | Admitting: Orthopedic Surgery

## 2023-02-21 ENCOUNTER — Ambulatory Visit (INDEPENDENT_AMBULATORY_CARE_PROVIDER_SITE_OTHER): Admitting: Orthopedic Surgery

## 2023-02-21 DIAGNOSIS — M25562 Pain in left knee: Secondary | ICD-10-CM

## 2023-02-21 DIAGNOSIS — G8929 Other chronic pain: Secondary | ICD-10-CM | POA: Diagnosis not present

## 2023-02-21 NOTE — Progress Notes (Signed)
Orthopaedic Clinic Return  Assessment: Donna [name] is a 52 y.o. female with the following: Left knee pain, recent MRI with increased signal within the medial tibial plateau.  Plan: [name] continues to have pain in her left knee, but is doing better overall.  Pain is getting better.  She has completed physical therapy, and is working on exercise on her own.  She continue with medications as needed.  Consider using her brace.  Current pain, is likely caused by the degenerative changes, as well as some increased signal within the medial tibial plateau.  She is tender in both areas.  She is ready to try and go back to work.  We have provided her with a work note, to return 5/20.  She will return to clinic as needed.   Follow-up: Return if symptoms worsen or fail to improve.   Subjective:  Chief Complaint  Patient presents with   Knee Pain    Bilat knee pain but L has been her on going concern. [facility] therapy appointment was Friday. Pt states it has helped but pain is worse with rain, in the mornings, and at night.     History of Present Illness: Donna [name] is a 52 y.o. female who returns to clinic for evaluation of left knee pain.  She sustained a fall at work.  Greater than 6 months ago.  She has completed working with physical therapy.  She is now doing exercises on her own.  She takes medicines as needed.  She uses a brace when her knee is bothering her.  She continues to have pain over the medial knee.  She started to have some pain in similar areas on the right knee.  She is ready to try and go back to work.  Review of Systems: No fevers or chills No numbness or tingling No chest pain No shortness of breath No bowel or bladder dysfunction No GI distress No headaches   Objective: There were no vitals taken for this visit.  Physical Exam:  Left knee with varus alignment.  No effusion.  Mild tenderness palpation along the medial joint line.  Mild tenderness to  palpation over the medial tibial plateau.  She has good range of motion.  Good strength.  No increased laxity varus valgus stress.  Negative Lachman.  The brace is fitting appropriately.    IMAGING: No new imaging obtained today.  Oliver Barre, MD 02/21/2023 12:02 PM

## 2023-02-21 NOTE — Patient Instructions (Signed)
Ok to return to work 02/27/23, please provide a letter for work

## 2023-02-27 ENCOUNTER — Emergency Department (HOSPITAL_COMMUNITY): Payer: Medicaid Other

## 2023-02-27 ENCOUNTER — Emergency Department (HOSPITAL_COMMUNITY)
Admission: EM | Admit: 2023-02-27 | Discharge: 2023-02-27 | Disposition: A | Discharge status: Home / Self Care | Payer: Medicaid Other | Attending: Student | Admitting: Student

## 2023-02-27 ENCOUNTER — Other Ambulatory Visit: Payer: Self-pay

## 2023-02-27 ENCOUNTER — Encounter (HOSPITAL_COMMUNITY): Payer: Self-pay

## 2023-02-27 DIAGNOSIS — Z041 Encounter for examination and observation following transport accident: Secondary | ICD-10-CM | POA: Diagnosis not present

## 2023-02-27 DIAGNOSIS — S199XXA Unspecified injury of neck, initial encounter: Secondary | ICD-10-CM | POA: Diagnosis not present

## 2023-02-27 DIAGNOSIS — S8011XA Contusion of right lower leg, initial encounter: Secondary | ICD-10-CM | POA: Insufficient documentation

## 2023-02-27 DIAGNOSIS — M25519 Pain in unspecified shoulder: Secondary | ICD-10-CM | POA: Diagnosis not present

## 2023-02-27 DIAGNOSIS — R1031 Right lower quadrant pain: Secondary | ICD-10-CM | POA: Insufficient documentation

## 2023-02-27 DIAGNOSIS — R9431 Abnormal electrocardiogram [ECG] [EKG]: Secondary | ICD-10-CM | POA: Diagnosis not present

## 2023-02-27 DIAGNOSIS — Z7982 Long term (current) use of aspirin: Secondary | ICD-10-CM | POA: Insufficient documentation

## 2023-02-27 DIAGNOSIS — R519 Headache, unspecified: Secondary | ICD-10-CM | POA: Insufficient documentation

## 2023-02-27 DIAGNOSIS — S8012XA Contusion of left lower leg, initial encounter: Secondary | ICD-10-CM | POA: Diagnosis not present

## 2023-02-27 DIAGNOSIS — S0990XA Unspecified injury of head, initial encounter: Secondary | ICD-10-CM | POA: Diagnosis not present

## 2023-02-27 DIAGNOSIS — R079 Chest pain, unspecified: Secondary | ICD-10-CM | POA: Diagnosis not present

## 2023-02-27 DIAGNOSIS — Y92410 Unspecified street and highway as the place of occurrence of the external cause: Secondary | ICD-10-CM | POA: Diagnosis not present

## 2023-02-27 DIAGNOSIS — S5012XA Contusion of left forearm, initial encounter: Secondary | ICD-10-CM | POA: Diagnosis not present

## 2023-02-27 DIAGNOSIS — R0789 Other chest pain: Secondary | ICD-10-CM | POA: Diagnosis not present

## 2023-02-27 LAB — CBC WITH DIFFERENTIAL/PLATELET
Abs Immature Granulocytes: 0.02 10*3/uL (ref 0.00–0.07)
Basophils Absolute: 0.1 10*3/uL (ref 0.0–0.1)
Basophils Relative: 1 %
Eosinophils Absolute: 0.2 10*3/uL (ref 0.0–0.5)
Eosinophils Relative: 2 %
HCT: 43.1 % (ref 36.0–46.0)
Hemoglobin: 13.9 g/dL (ref 12.0–15.0)
Immature Granulocytes: 0 %
Lymphocytes Relative: 34 %
Lymphs Abs: 2.3 10*3/uL (ref 0.7–4.0)
MCH: 28.1 pg (ref 26.0–34.0)
MCHC: 32.3 g/dL (ref 30.0–36.0)
MCV: 87.1 fL (ref 80.0–100.0)
Monocytes Absolute: 0.5 10*3/uL (ref 0.1–1.0)
Monocytes Relative: 8 %
Neutro Abs: 3.7 10*3/uL (ref 1.7–7.7)
Neutrophils Relative %: 55 %
Platelets: 313 10*3/uL (ref 150–400)
RBC: 4.95 MIL/uL (ref 3.87–5.11)
RDW: 14.4 % (ref 11.5–15.5)
WBC: 6.7 10*3/uL (ref 4.0–10.5)
nRBC: 0 % (ref 0.0–0.2)

## 2023-02-27 LAB — COMPREHENSIVE METABOLIC PANEL
ALT: 20 U/L (ref 0–44)
AST: 29 U/L (ref 15–41)
Albumin: 3.8 g/dL (ref 3.5–5.0)
Alkaline Phosphatase: 77 U/L (ref 38–126)
Anion gap: 8 (ref 5–15)
BUN: 16 mg/dL (ref 6–20)
CO2: 24 mmol/L (ref 22–32)
Calcium: 8.9 mg/dL (ref 8.9–10.3)
Chloride: 106 mmol/L (ref 98–111)
Creatinine, Ser: 1.01 mg/dL — ABNORMAL HIGH (ref 0.44–1.00)
GFR, Estimated: >60 mL/min (ref >60)
Glucose, Bld: 118 mg/dL — ABNORMAL HIGH (ref 70–99)
Potassium: 3.4 mmol/L — ABNORMAL LOW (ref 3.5–5.1)
Sodium: 138 mmol/L (ref 135–145)
Total Bilirubin: 0.6 mg/dL (ref 0.3–1.2)
Total Protein: 7.1 g/dL (ref 6.5–8.1)

## 2023-02-27 LAB — HCG, QUANTITATIVE, PREGNANCY: hCG, Beta Chain, Quant, S: 5 m[IU]/mL — ABNORMAL HIGH (ref <5)

## 2023-02-27 MED ORDER — MORPHINE SULFATE (PF) 4 MG/ML IV SOLN
4.0000 mg | Freq: Once | INTRAVENOUS | Status: AC
  Administered 2023-02-27: 4 mg via INTRAVENOUS
  Filled 2023-02-27: qty 1

## 2023-02-27 MED ORDER — IOHEXOL 300 MG/ML  SOLN
100.0000 mL | Freq: Once | INTRAMUSCULAR | Status: AC | PRN
  Administered 2023-02-27: 100 mL via INTRAVENOUS

## 2023-02-27 MED ORDER — IBUPROFEN 600 MG PO TABS: 600.0000 mg | ORAL_TABLET | Freq: Four times a day (QID) | ORAL | 0 refills | Status: DC | PRN

## 2023-02-27 MED ORDER — CYCLOBENZAPRINE HCL 10 MG PO TABS: 10.0000 mg | ORAL_TABLET | Freq: Two times a day (BID) | ORAL | 0 refills | Status: DC | PRN

## 2023-02-27 MED ORDER — ACETAMINOPHEN 325 MG PO TABS
650.0000 mg | ORAL_TABLET | Freq: Once | ORAL | Status: AC
  Administered 2023-02-27: 650 mg via ORAL
  Filled 2023-02-27: qty 2

## 2023-02-27 MED ORDER — KETOROLAC TROMETHAMINE 15 MG/ML IJ SOLN
15.0000 mg | Freq: Once | INTRAMUSCULAR | Status: AC
  Administered 2023-02-27: 15 mg via INTRAVENOUS
  Filled 2023-02-27: qty 1

## 2023-02-27 MED ORDER — LIDOCAINE 5 % EX PTCH: 1.0000 | MEDICATED_PATCH | CUTANEOUS | 0 refills | Status: DC
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
## 2023-02-27 NOTE — ED Notes (Signed)
Patient arrived back from CT
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
## 2023-02-27 NOTE — ED Notes (Signed)
Patient went to CT

## 2023-03-07 ENCOUNTER — Encounter: Payer: Medicaid Other | Admitting: Orthopedic Surgery

## 2023-03-10 ENCOUNTER — Encounter: Payer: Self-pay | Admitting: Family Medicine

## 2023-03-10 ENCOUNTER — Ambulatory Visit: Payer: Medicaid Other | Admitting: Family Medicine

## 2023-03-10 VITALS — BP 132/88 | HR 85 | Ht 61.0 in | Wt 168.0 lb

## 2023-03-10 DIAGNOSIS — G8929 Other chronic pain: Secondary | ICD-10-CM

## 2023-03-10 DIAGNOSIS — M549 Dorsalgia, unspecified: Secondary | ICD-10-CM | POA: Insufficient documentation

## 2023-03-10 DIAGNOSIS — M545 Low back pain, unspecified: Secondary | ICD-10-CM | POA: Diagnosis not present

## 2023-03-10 MED ORDER — DULOXETINE HCL 30 MG PO CPEP
30.0000 mg | ORAL_CAPSULE | Freq: Every day | ORAL | 3 refills | Status: DC
Start: 2023-03-10 — End: 2023-10-03

## 2023-03-10 MED ORDER — OXYCODONE-ACETAMINOPHEN 5-325 MG PO TABS: 1.0000 | ORAL_TABLET | ORAL | 0 refills | Status: AC | PRN

## 2023-03-10 NOTE — Patient Instructions (Addendum)
        Great to see you today.   - Please take medications as prescribed. - Follow up with your primary health provider if any health concerns arises. - If symptoms worsen please contact your primary care provider and/or visit the emergency department.  
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
## 2023-04-05 ENCOUNTER — Ambulatory Visit: Payer: Medicaid Other | Admitting: Family Medicine

## 2023-04-06 ENCOUNTER — Encounter: Payer: Self-pay | Admitting: Orthopedic Surgery

## 2023-04-10 ENCOUNTER — Encounter: Payer: Medicaid Other | Admitting: Family Medicine

## 2023-04-14 ENCOUNTER — Telehealth: Payer: Self-pay | Admitting: Radiology

## 2023-04-14 NOTE — Telephone Encounter (Signed)
Isabel Caprice faxed a form with question on FCE for Dr Dallas Schimke to answer, sign, fax back to [phone] please.  I know it came, I sat it in the stand to go back to his inbox.  Can you all check on this?  Thanks.

## 2023-04-18 NOTE — Telephone Encounter (Signed)
Appointment scheduled 04/24/2023

## 2023-04-24 ENCOUNTER — Ambulatory Visit: Payer: Medicaid Other | Admitting: Internal Medicine

## 2023-04-24 ENCOUNTER — Other Ambulatory Visit: Payer: Self-pay | Admitting: Family Medicine

## 2023-04-24 VITALS — BP 160/120 | HR 78 | Ht 61.0 in | Wt 165.0 lb

## 2023-04-24 DIAGNOSIS — G8929 Other chronic pain: Secondary | ICD-10-CM | POA: Diagnosis not present

## 2023-04-24 DIAGNOSIS — M25562 Pain in left knee: Secondary | ICD-10-CM | POA: Diagnosis not present

## 2023-04-24 DIAGNOSIS — M25561 Pain in right knee: Secondary | ICD-10-CM | POA: Insufficient documentation

## 2023-04-24 NOTE — Patient Instructions (Signed)
Your right knee pain is due to arthritis. These are the different medications you can take for this: Tylenol 500mg  1-2 tabs three times a day for pain. Capsaicin, aspercreme, or biofreeze topically up to four times a day may also help with pain. Aleve 1-2 tabs twice a day with food Cortisone injections are an option. If cortisone injections do not help, there are different types of shots that may help but they take longer to take effect. It's important that you continue to stay active. Straight leg raises, knee extensions 3 sets of 10 once a day (add ankle weight if these become too easy). Heat or ice 15 minutes at a time 3-4 times a day as needed to help with pain. Water aerobics and cycling with low resistance are the best two types of exercise for arthritis though any exercise is ok as long as it doesn't worsen the pain. Follow up with me if you need cortisone injection

## 2023-04-24 NOTE — Assessment & Plan Note (Signed)
Patient request letter for work. She says Dr.Cairns request this comes from PCP. I am seeing patient in PCP office today for right knee pain. Provided letter today to ask employer to excuse patient from work until patient can have functional capacity evaluation ordered by Dr.Cairns.
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
## 2023-05-10 ENCOUNTER — Telehealth: Payer: Self-pay | Admitting: Orthopedic Surgery

## 2023-05-10 DIAGNOSIS — G8929 Other chronic pain: Secondary | ICD-10-CM

## 2023-05-10 DIAGNOSIS — M1712 Unilateral primary osteoarthritis, left knee: Secondary | ICD-10-CM

## 2023-05-10 NOTE — Telephone Encounter (Signed)
Dr. Dallas Schimke pt Donna [name] w/[employer] [phone] called about this patient, stating she needs the FCE order that was ordered by Dr. Dallas Schimke faxed to [phone] ref # [id number].

## 2023-05-15 ENCOUNTER — Encounter: Payer: Medicaid Other | Admitting: Family Medicine

## 2023-05-17 ENCOUNTER — Encounter: Payer: Self-pay | Admitting: Family Medicine

## 2023-05-17 ENCOUNTER — Ambulatory Visit: Payer: Medicaid Other | Attending: Family Medicine

## 2023-05-18 ENCOUNTER — Other Ambulatory Visit: Payer: Self-pay | Admitting: Family Medicine

## 2023-05-18 DIAGNOSIS — Z1231 Encounter for screening mammogram for malignant neoplasm of breast: Secondary | ICD-10-CM

## 2023-05-22 DIAGNOSIS — Z1231 Encounter for screening mammogram for malignant neoplasm of breast: Secondary | ICD-10-CM

## 2023-05-23 ENCOUNTER — Ambulatory Visit: Payer: Medicaid Other | Admitting: Physical Therapy

## 2023-05-25 NOTE — Telephone Encounter (Signed)
No order seen, order entered.

## 2023-05-25 NOTE — Addendum Note (Signed)
Addended by: Baird Kay on: 05/25/2023 04:34 PM   Modules accepted: Orders

## 2023-05-31 ENCOUNTER — Other Ambulatory Visit: Payer: Self-pay | Admitting: Nurse Practitioner

## 2023-05-31 DIAGNOSIS — Z1231 Encounter for screening mammogram for malignant neoplasm of breast: Secondary | ICD-10-CM

## 2023-06-01 ENCOUNTER — Ambulatory Visit: Payer: Medicaid Other | Admitting: Physical Therapy

## 2023-06-06 ENCOUNTER — Ambulatory Visit: Payer: Medicaid Other | Admitting: Family Medicine

## 2023-06-07 DIAGNOSIS — Z1231 Encounter for screening mammogram for malignant neoplasm of breast: Secondary | ICD-10-CM

## 2023-06-08 ENCOUNTER — Encounter (HOSPITAL_COMMUNITY): Payer: Self-pay

## 2023-06-08 ENCOUNTER — Telehealth: Payer: Medicaid Other | Admitting: Family Medicine

## 2023-06-08 ENCOUNTER — Ambulatory Visit (HOSPITAL_COMMUNITY)
Admission: RE | Admit: 2023-06-08 | Discharge: 2023-06-08 | Disposition: A | Discharge status: Home / Self Care | Payer: Medicaid Other | Source: Ambulatory Visit | Attending: Family Medicine | Admitting: Family Medicine

## 2023-06-08 VITALS — BP 123/86 | HR 93 | Temp 98.4°F | Resp 16

## 2023-06-08 DIAGNOSIS — N760 Acute vaginitis: Secondary | ICD-10-CM

## 2023-06-08 DIAGNOSIS — N3001 Acute cystitis with hematuria: Secondary | ICD-10-CM | POA: Diagnosis not present

## 2023-06-08 LAB — POCT URINALYSIS DIP (MANUAL ENTRY)
Bilirubin, UA: NEGATIVE
Glucose, UA: NEGATIVE mg/dL
Ketones, POC UA: NEGATIVE mg/dL
Nitrite, UA: NEGATIVE
Protein Ur, POC: NEGATIVE mg/dL
Spec Grav, UA: 1.025 (ref 1.010–1.025)
Urobilinogen, UA: 0.2 E.U./dL
pH, UA: 6.5 (ref 5.0–8.0)

## 2023-06-08 LAB — POCT URINE PREGNANCY: Preg Test, Ur: NEGATIVE

## 2023-06-08 MED ORDER — NITROFURANTOIN MONOHYD MACRO 100 MG PO CAPS
100.0000 mg | ORAL_CAPSULE | Freq: Two times a day (BID) | ORAL | 0 refills | Status: AC
Start: 2023-06-08 — End: 2023-06-11

## 2023-06-08 NOTE — ED Provider Notes (Signed)
Donna [name]    CSN: [id number] Arrival date & time: 06/08/23  1914      History   Chief Complaint Chief Complaint  Patient presents with   Urinary Frequency    UTI peeing alot ..pee is cloudy andu stomach hurts below maybe need yo get std - Entered by patient   Abdominal Pain   vaginal irritation    HPI Donna [name] is a 52 y.o. female.  Patient presents today with concern for UTI.  Is also requesting STD testing for precautionary reasons only.  She endorses having some vaginal irritation but denies itching.  She is also having lower abdominal pressure which she reports is bladder pressure.  She is also having urinary frequency and that her urine has a dark ammonia type smell.  All symptoms have been present for approximately 4 to 5 days.  Patient has no history of recurrent UTIs although has had a history of vaginitis.  Denies any other concerns.   Past Medical History:  Diagnosis Date   Anemia    Contraceptive management 04/21/2014   Environmental allergies 12/30/2014   Herpes    History of anemia 05/05/2016   History of herpes simplex infection 04/21/2014   Irregular bleeding 05/05/2015   Itching in the vaginal area 05/05/2016   Retained tampon 05/05/2015   Umbilical hernia 12/30/2014   Vaginal discharge 05/05/2015    Patient Active Problem List   Diagnosis Date Noted   Right knee pain 04/24/2023   Left knee pain 04/24/2023   Back pain 03/10/2023   Vulvovaginitis 04/21/2022   Annual physical exam 01/21/2022   Atopic dermatitis in adult 01/21/2022   Seasonal allergies 01/21/2022   Caregiver stress 01/21/2022   Depression, major, single episode, mild (HCC) 01/21/2022   Trichimoniasis 10/15/2019   Vaginal odor 10/09/2019   Vaginal bleeding 10/09/2019   Screening examination for STD (sexually transmitted disease) 10/09/2019   Elevated BP without diagnosis of hypertension 10/09/2019   Iron deficiency anemia due to chronic blood loss 10/09/2019   Uterine  fibroids 09/11/2017   Screening for colorectal cancer 06/27/2017   History of anemia 05/05/2016   Irregular bleeding 05/05/2015   Retained tampon 05/05/2015   Umbilical hernia 12/30/2014   Environmental allergies 12/30/2014   History of herpes simplex infection 04/21/2014    Past Surgical History:  Procedure Laterality Date   CERVICAL CERCLAGE  2010 and 2004   [employer] cerclage   [facility]  [facility]   UMBILICAL HERNIA REPAIR  01/30/2012   Procedure: HERNIA REPAIR UMBILICAL ADULT;  Surgeon: Fabio Bering, MD;  Location: [facility];  Service: General;  Laterality: N/A;  With mesh    OB History     Gravida  5   Para  3   Term      Preterm      AB  2   Living  3      SAB  2   IAB      Ectopic      Multiple      Live Births               Home Medications    Prior to Admission medications   Medication Sig Start Date End Date Taking? Authorizing Provider  nitrofurantoin, macrocrystal-monohydrate, (MACROBID) 100 MG capsule Take 1 capsule (100 mg total) by mouth 2 (two) times daily for 3 days. 06/08/23 06/11/23 Yes Bing Neighbors, NP  aspirin-sod bicarb-citric acid (ALKA-SELTZER) 325 MG TBEF tablet Take 325 mg  by mouth once as needed (for headache).    [provider]  cyclobenzaprine (FLEXERIL) 10 MG tablet Take 1 tablet (10 mg total) by mouth 2 (two) times daily as needed for muscle spasms. 02/27/23   Carmel Sacramento A, PA-C  DULoxetine (CYMBALTA) 30 MG capsule Take 1 capsule (30 mg total) by mouth daily. 03/10/23   Del Nigel Berthold, FNP  ferrous sulfate 325 (65 FE) MG EC tablet Take 1 tablet (325 mg total) by mouth daily. 04/25/22   Paseda, Baird Kay, FNP  fluticasone (FLONASE) 50 MCG/ACT nasal spray Place 1 spray into both nostrils daily as needed for allergies.    [provider]  ibuprofen (ADVIL) 600 MG tablet Take 1 tablet (600 mg total) by mouth every 6 (six) hours as needed. 02/27/23   Carmel Sacramento A, PA-C   lidocaine (LIDODERM) 5 % Place 1 patch onto the skin daily. Remove & Discard patch within 12 hours or as directed by MD 02/27/23   Carmel Sacramento A, PA-C  metroNIDAZOLE (FLAGYL) 500 MG tablet Take 1 tablet (500 mg total) by mouth 2 (two) times daily for 7 days. 06/09/23 06/16/23  Merrilee Jansky, MD  valACYclovir (VALTREX) 1000 MG tablet Take 0.5 tablets (500 mg total) by mouth daily as needed (for outbreak). 12/16/20   Adline Potter, NP  cetirizine (ZYRTEC) 10 MG tablet Take 1 tablet (10 mg total) by mouth daily as needed for allergies. 09/18/20 02/02/21  Adline Potter, NP    Family History Family History  Problem Relation Age of Onset   Diabetes Mother    Hypertension Mother    Diabetes Father    Hypertension Father    Multiple sclerosis Father    Asthma Daughter    Asthma Daughter    Cancer Maternal Grandfather        prostate   Anesthesia problems Neg Hx    Hypotension Neg Hx    Malignant hyperthermia Neg Hx    Pseudochol deficiency Neg Hx     Social History Social History   Tobacco Use   Smoking status: Never   Smokeless tobacco: Never  Vaping Use   Vaping status: Never Used  Substance Use Topics   Alcohol use: No   Drug use: Never     Allergies   Sulfa antibiotics   Review of Systems Review of Systems Pertinent negatives listed in HPI   Physical Exam Triage Vital Signs ED Triage Vitals  Encounter Vitals Group     BP 06/08/23 0926 123/86     Systolic BP Percentile --      Diastolic BP Percentile --      Pulse Rate 06/08/23 0926 93     Resp 06/08/23 0926 16     Temp 06/08/23 0926 98.4 F (36.9 C)     Temp Source 06/08/23 0926 Oral     SpO2 06/08/23 0926 98 %     Weight --      Height --      Head Circumference --      Peak Flow --      Pain Score 06/08/23 0927 3     Pain Loc --      Pain Education --      Exclude from Growth Chart --    No data found.  Updated Vital Signs BP 123/86 (BP Location: Left Arm)   Pulse 93   Temp 98.4 F  (36.9 C) (Oral)   Resp 16   SpO2 98%   Visual Acuity  Right Eye Distance:   Left Eye Distance:   Bilateral Distance:    Right Eye Near:   Left Eye Near:    Bilateral Near:     Physical Exam General appearance: Alert, well developed, well nourished, cooperative and in no distress Head: Normocephalic, without obvious abnormality, atraumatic Heart: Rate and Rhythm normal.   Respiratory: Respirations even and unlabored, normal respiratory rate CVA: Negative for  flank pain Extremities: No gross deformities Skin: Skin color, texture, turgor normal. No rashes seen  Psych: Appropriate mood and affect.  UC Treatments / Results  Labs (all labs ordered are listed, but only abnormal results are displayed) Labs Reviewed  POCT URINALYSIS DIP (MANUAL ENTRY) - Abnormal; Notable for the following components:      Result Value   Clarity, UA cloudy (*)    Blood, UA trace-intact (*)    Leukocytes, UA Small (1+) (*)    All other components within normal limits  CERVICOVAGINAL ANCILLARY ONLY - Abnormal; Notable for the following components:   Bacterial Vaginitis (gardnerella) Positive (*)    All other components within normal limits  POCT URINE PREGNANCY    EKG   Radiology No results found.  Procedures Procedures (including critical care time)  Medications Ordered in UC Medications - No data to display  Initial Impression / Assessment and Plan / UC Course  I have reviewed the triage vital signs and the nursing notes.  Pertinent labs & imaging results that were available during my care of the patient were reviewed by me and considered in my medical decision making (see chart for details).      UA abnormal and findings consistent with UTI. Empiric antibiotic treatment initiated. Encouraged increase intake of water.  Vaginal cytology is pending.  Will cover with empiric treatment for UTI Macrobid twice daily for 3 days while awaiting urine culture.   ER if symptoms become severe.  Follow-up with PCP if symptoms do not completely resolve.  Final Clinical Impressions(s) / UC Diagnoses   Final diagnoses:  Acute cystitis with hematuria  Acute vaginitis     Discharge Instructions      Your urine culture will result within 3 days.  We will notify you if there is any changes in treatment based on urine culture results.  If you do not hear anything from our clinic continue with current treatment as prescribed.  Hydrate well with fluids.  If you develop any fever, nausea or vomiting or severe abdominal pain go immediately to the emergency department.   Other lab results will be available within 24-48 hours. If any treatment is warranted, our office typically will notify you if any results are abnormal if you see anything on your MyChart antibodies reached out to you feel free to contact our office directly.    ED Prescriptions     Medication Sig Dispense Auth. Provider   nitrofurantoin, macrocrystal-monohydrate, (MACROBID) 100 MG capsule Take 1 capsule (100 mg total) by mouth 2 (two) times daily for 3 days. 6 capsule Bing Neighbors, NP      PDMP not reviewed this encounter.   Bing Neighbors, NP 06/10/23 1158

## 2023-06-08 NOTE — ED Triage Notes (Signed)
Patient c/o urinary frequency and states her urine is dark, ammonia smelling urine, and foamy x 4-5 days.  Patient also c/o bladder pressure and states she has vaginal irritation, but denies itching.

## 2023-06-08 NOTE — Discharge Instructions (Addendum)
Your urine culture will result within 3 days.  We will notify you if there is any changes in treatment based on urine culture results.  If you do not hear anything from our clinic continue with current treatment as prescribed.  Hydrate well with fluids.  If you develop any fever, nausea or vomiting or severe abdominal pain go immediately to the emergency department.   Other lab results will be available within 24-48 hours. If any treatment is warranted, our office typically will notify you if any results are abnormal if you see anything on your MyChart antibodies reached out to you feel free to contact our office directly.

## 2023-06-09 ENCOUNTER — Telehealth: Payer: Self-pay

## 2023-06-09 MED ORDER — METRONIDAZOLE 500 MG PO TABS: 500.0000 mg | ORAL_TABLET | Freq: Two times a day (BID) | ORAL | 0 refills | Status: AC

## 2023-06-09 NOTE — Telephone Encounter (Signed)
Per protocol, pt requires tx with metronidazole. Reviewed with patient, verified pharmacy, prescription sent.

## 2023-06-13 ENCOUNTER — Encounter: Payer: Self-pay | Admitting: Family Medicine

## 2023-06-14 ENCOUNTER — Ambulatory Visit: Payer: Medicaid Other

## 2023-06-14 DIAGNOSIS — Z1231 Encounter for screening mammogram for malignant neoplasm of breast: Secondary | ICD-10-CM

## 2023-06-15 ENCOUNTER — Encounter: Payer: Self-pay | Admitting: Family Medicine

## 2023-06-15 LAB — CERVICOVAGINAL ANCILLARY ONLY
Bacterial Vaginitis (gardnerella): POSITIVE — AB
Candida Glabrata: NEGATIVE
Candida Vaginitis: NEGATIVE
Chlamydia: NEGATIVE
Comment: NEGATIVE
Comment: NEGATIVE
Comment: NEGATIVE
Comment: NEGATIVE
Comment: NEGATIVE
Comment: NORMAL
Neisseria Gonorrhea: NEGATIVE
Trichomonas: NEGATIVE

## 2023-06-16 ENCOUNTER — Other Ambulatory Visit: Payer: Self-pay | Admitting: Family Medicine

## 2023-06-16 DIAGNOSIS — Z1231 Encounter for screening mammogram for malignant neoplasm of breast: Secondary | ICD-10-CM

## 2023-06-20 DIAGNOSIS — Z1231 Encounter for screening mammogram for malignant neoplasm of breast: Secondary | ICD-10-CM

## 2023-06-20 NOTE — Progress Notes (Unsigned)
Madelaine Bhat, CMA,acting as a Neurosurgeon for Donna Felts, FNP.,have documented all relevant documentation on the behalf of Donna Felts, FNP,as directed by  Donna Felts, FNP while in the presence of Donna Felts, FNP.  Subjective:  Patient ID: Donna [name] , female    DOB: 11-Aug-1971 , 52 y.o.   MRN: [id number]  No chief complaint on file.   HPI  Patient presents today to establish care.     Past Medical History:  Diagnosis Date  . Anemia   . Contraceptive management 04/21/2014  . Environmental allergies 12/30/2014  . Herpes   . History of anemia 05/05/2016  . History of herpes simplex infection 04/21/2014  . Irregular bleeding 05/05/2015  . Itching in the vaginal area 05/05/2016  . Retained tampon 05/05/2015  . Umbilical hernia 12/30/2014  . Vaginal discharge 05/05/2015     Family History  Problem Relation Age of Onset  . Diabetes Mother   . Hypertension Mother   . Diabetes Father   . Hypertension Father   . Multiple sclerosis Father   . Asthma Daughter   . Asthma Daughter   . Cancer Maternal Grandfather        prostate  . Anesthesia problems Neg Hx   . Hypotension Neg Hx   . Malignant hyperthermia Neg Hx   . Pseudochol deficiency Neg Hx      Current Outpatient Medications:  .  aspirin-sod bicarb-citric acid (ALKA-SELTZER) 325 MG TBEF tablet, Take 325 mg by mouth once as needed (for headache)., Disp: , Rfl:  .  cyclobenzaprine (FLEXERIL) 10 MG tablet, Take 1 tablet (10 mg total) by mouth 2 (two) times daily as needed for muscle spasms., Disp: 20 tablet, Rfl: 0 .  DULoxetine (CYMBALTA) 30 MG capsule, Take 1 capsule (30 mg total) by mouth daily., Disp: 30 capsule, Rfl: 3 .  ferrous sulfate 325 (65 FE) MG EC tablet, Take 1 tablet (325 mg total) by mouth daily., Disp: 30 tablet, Rfl: 2 .  fluticasone (FLONASE) 50 MCG/ACT nasal spray, Place 1 spray into both nostrils daily as needed for allergies., Disp: , Rfl:  .  ibuprofen (ADVIL) 600 MG tablet, Take 1 tablet (600 mg  total) by mouth every 6 (six) hours as needed., Disp: 30 tablet, Rfl: 0 .  lidocaine (LIDODERM) 5 %, Place 1 patch onto the skin daily. Remove & Discard patch within 12 hours or as directed by MD, Disp: 30 patch, Rfl: 0 .  valACYclovir (VALTREX) 1000 MG tablet, Take 0.5 tablets (500 mg total) by mouth daily as needed (for outbreak)., Disp: 20 tablet, Rfl: 3   Allergies  Allergen Reactions  . Sulfa Antibiotics Rash     Review of Systems   There were no vitals filed for this visit. There is no height or weight on file to calculate BMI.  Wt Readings from Last 3 Encounters:  04/24/23 165 lb (74.8 kg)  03/10/23 168 lb (76.2 kg)  02/27/23 170 lb (77.1 kg)    The 10-year ASCVD risk score (Arnett DK, et al., 2019) is: 1.7%   Values used to calculate the score:     Age: 73 years     Sex: Female     Is Non-Hispanic African American: Yes     Diabetic: No     Tobacco smoker: No     Systolic Blood Pressure: 123 mmHg     Is BP treated: No     HDL Cholesterol: 50 mg/dL     Total Cholesterol: 163 mg/dL  Objective:  Physical Exam      Assessment And Plan:  Establishing care with new doctor, encounter for    No follow-ups on file.  Patient was given opportunity to ask questions. Patient verbalized understanding of the plan and was able to repeat key elements of the plan. All questions were answered to their satisfaction.    Jeanell Sparrow, FNP, have reviewed all documentation for this visit. The documentation on 06/20/23 for the exam, diagnosis, procedures, and orders are all accurate and complete.   IF YOU HAVE BEEN REFERRED TO A SPECIALIST, IT MAY TAKE 1-2 WEEKS TO SCHEDULE/PROCESS THE REFERRAL. IF YOU HAVE NOT HEARD FROM US/SPECIALIST IN TWO WEEKS, PLEASE GIVE [place] A CALL AT [phone].

## 2023-06-22 ENCOUNTER — Ambulatory Visit: Payer: Medicaid Other | Admitting: Nurse Practitioner

## 2023-06-22 DIAGNOSIS — Z7689 Persons encountering health services in other specified circumstances: Secondary | ICD-10-CM

## 2023-06-24 ENCOUNTER — Ambulatory Visit: Payer: Medicaid Other

## 2023-06-26 ENCOUNTER — Other Ambulatory Visit: Payer: Self-pay | Admitting: Family Medicine

## 2023-07-05 ENCOUNTER — Other Ambulatory Visit: Payer: Self-pay | Admitting: Family Medicine

## 2023-07-05 DIAGNOSIS — M545 Low back pain, unspecified: Secondary | ICD-10-CM

## 2023-07-06 ENCOUNTER — Other Ambulatory Visit: Payer: Self-pay | Admitting: Family Medicine

## 2023-07-06 DIAGNOSIS — G8929 Other chronic pain: Secondary | ICD-10-CM

## 2023-07-06 DIAGNOSIS — M542 Cervicalgia: Secondary | ICD-10-CM

## 2023-07-07 ENCOUNTER — Other Ambulatory Visit: Payer: Self-pay | Admitting: Family Medicine

## 2023-07-07 ENCOUNTER — Ambulatory Visit: Payer: No Typology Code available for payment source | Admitting: Physical Therapy

## 2023-07-07 DIAGNOSIS — Z1231 Encounter for screening mammogram for malignant neoplasm of breast: Secondary | ICD-10-CM

## 2023-07-07 NOTE — Therapy (Deleted)
OUTPATIENT PHYSICAL THERAPY THORACOLUMBAR EVALUATION   Patient Name: Donna [name] MRN: [id number] DOB:[birth year], 52 y.o., female Today's Date: 07/07/2023  END OF SESSION:   Past Medical History:  Diagnosis Date   Anemia    Contraceptive management 04/21/2014   Environmental allergies 12/30/2014   Herpes    History of anemia 05/05/2016   History of herpes simplex infection 04/21/2014   Irregular bleeding 05/05/2015   Itching in the vaginal area 05/05/2016   Retained tampon 05/05/2015   Umbilical hernia 12/30/2014   Vaginal discharge 05/05/2015   Past Surgical History:  Procedure Laterality Date   CERVICAL CERCLAGE  2010 and 2004   [employer] cerclage   [facility]  [facility]   [facility]   UMBILICAL HERNIA REPAIR  01/30/2012   Procedure: HERNIA REPAIR UMBILICAL ADULT;  Surgeon: Fabio Bering, MD;  Location: [facility];  Service: General;  Laterality: N/A;  With mesh   Patient Active Problem List   Diagnosis Date Noted   Right knee pain 04/24/2023   Left knee pain 04/24/2023   Back pain 03/10/2023   Vulvovaginitis 04/21/2022   Annual physical exam 01/21/2022   Atopic dermatitis in adult 01/21/2022   Seasonal allergies 01/21/2022   Caregiver stress 01/21/2022   Depression, major, single episode, mild (HCC) 01/21/2022   Trichimoniasis 10/15/2019   Vaginal odor 10/09/2019   Vaginal bleeding 10/09/2019   Screening examination for STD (sexually transmitted disease) 10/09/2019   Elevated BP without diagnosis of hypertension 10/09/2019   Iron deficiency anemia due to chronic blood loss 10/09/2019   Uterine fibroids 09/11/2017   Screening for colorectal cancer 06/27/2017   History of anemia 05/05/2016   Irregular bleeding 05/05/2015   Retained tampon 05/05/2015   Umbilical hernia 12/30/2014   Environmental allergies 12/30/2014   History of herpes simplex infection 04/21/2014    PCP: Gilmore Laroche, FNP  REFERRING PROVIDER: Gilmore Laroche, FNP  REFERRING DIAG:  M54.50,G89.29 (ICD-10-CM) - Chronic low back pain, unspecified back pain laterality, unspecified whether sciatica present  Rationale for Evaluation and Treatment: Rehabilitation  THERAPY DIAG:  No diagnosis found.  ONSET DATE: ***  SUBJECTIVE:                                                                                                                                                                                           SUBJECTIVE STATEMENT: ***  PERTINENT HISTORY:  ***  PAIN:  Are you having pain? Yes: NPRS scale: ***/10 Pain location: *** Pain description: *** Aggravating factors: *** Relieving factors: ***  PRECAUTIONS: {Therapy precautions:24002}  RED FLAGS: {PT Red Flags:29287}   WEIGHT BEARING RESTRICTIONS: {Yes ***/No:24003}  FALLS:  Has  patient fallen in last 6 months? {fallsyesno:27318}  LIVING ENVIRONMENT: Lives with: {OPRC lives with:25569::"lives with their family"} Lives in: {Lives in:25570} Stairs: {opstairs:27293} Has following equipment at home: {Assistive devices:23999}  OCCUPATION: ***  PLOF: Leisure: ***  PATIENT GOALS: ***  NEXT MD VISIT: ***  OBJECTIVE:   DIAGNOSTIC FINDINGS:  ***  PATIENT SURVEYS:  {rehab surveys:24030}  SCREENING FOR RED FLAGS: Bowel or bladder incontinence: {Yes/No:304960894} Spinal tumors: {Yes/No:304960894} Cauda equina syndrome: {Yes/No:304960894} Compression fracture: {Yes/No:304960894} Abdominal aneurysm: {Yes/No:304960894}  COGNITION: Overall cognitive status: {cognition:24006}     SENSATION: {sensation:27233}  MUSCLE LENGTH: Hamstrings: Right *** deg; Left *** deg Thomas test: Right *** deg; Left *** deg  POSTURE: {posture:25561}  PALPATION: ***  LUMBAR ROM:   AROM eval  Flexion   Extension   Right lateral flexion   Left lateral flexion   Right rotation   Left rotation    (Blank rows = not tested)  LOWER EXTREMITY ROM:     {AROM/PROM:27142}  Right eval Left eval  Hip  flexion    Hip extension    Hip abduction    Hip adduction    Hip internal rotation    Hip external rotation    Knee flexion    Knee extension    Ankle dorsiflexion    Ankle plantarflexion    Ankle inversion    Ankle eversion     (Blank rows = not tested)  LOWER EXTREMITY MMT:    MMT Right eval Left eval  Hip flexion    Hip extension    Hip abduction    Hip adduction    Hip internal rotation    Hip external rotation    Knee flexion    Knee extension    Ankle dorsiflexion    Ankle plantarflexion    Ankle inversion    Ankle eversion     (Blank rows = not tested)  LUMBAR SPECIAL TESTS:  {lumbar special test:25242}  FUNCTIONAL TESTS:  {Functional tests:24029}  GAIT: Distance walked: *** Assistive device utilized: {Assistive devices:23999} Level of assistance: {Levels of assistance:24026} Comments: ***  TODAY'S TREATMENT:                                                                                                                               DATE: 07/07/2023 HEP Established  If treatment provided at initial evaluation, no treatment charged due to lack of authorization.        PATIENT EDUCATION:  Education details: *** Person educated: {Person educated:25204} Education method: {Education Method:25205} Education comprehension: {Education Comprehension:25206}  HOME EXERCISE PROGRAM: ***  ASSESSMENT:  CLINICAL IMPRESSION: Patient is a 52 y.o. female who was seen today for physical therapy evaluation and treatment for chronic low back pain.   OBJECTIVE IMPAIRMENTS: {opptimpairments:25111}.   ACTIVITY LIMITATIONS: {activitylimitations:27494}  PARTICIPATION LIMITATIONS: {participationrestrictions:25113}  PERSONAL FACTORS: {Personal factors:25162} are also affecting patient's functional outcome.   REHAB POTENTIAL: {rehabpotential:25112}  CLINICAL DECISION MAKING: {clinical decision making:25114}  EVALUATION COMPLEXITY: {Evaluation  complexity:25115}   GOALS: Goals reviewed with patient? Yes  SHORT TERM GOALS: Target date: 08/04/2023 Patient will be independent with initial HEP. Baseline:  Goal status: INITIAL  2.  Patient will report > or = to *** improvement in ***. Baseline:  Goal status: INITIAL   3.  *** Baseline:  Goal status: INITIAL  4.  *** Baseline:  Goal status: INITIAL  5.  *** Baseline:  Goal status: INITIAL  6.  *** Baseline:  Goal status: INITIAL  LONG TERM GOALS: Target date: 09/01/2023  Patient will demonstrate independence in advanced HEP. Baseline:  Goal status: INITIAL  2.  Patient will report > or = to *** improvement in ***. Baseline:  Goal status: INITIAL   3.  *** Baseline:  Goal status: INITIAL  4.  *** Baseline:  Goal status: INITIAL  5.  *** Baseline:  Goal status: INITIAL  6.  *** Baseline:  Goal status: INITIAL  PLAN:  PT FREQUENCY: {rehab frequency:25116}  PT DURATION: {rehab duration:25117}  PLANNED INTERVENTIONS: {rehab planned interventions:25118::"Therapeutic exercises","Therapeutic activity","Neuromuscular re-education","Balance training","Gait training","Patient/Family education","Self Care","Joint mobilization"}.  PLAN FOR NEXT SESSION: ***   Montel Clock 07/07/2023, 7:42 AM

## 2023-07-12 ENCOUNTER — Ambulatory Visit: Payer: Medicaid Other | Admitting: Orthopedic Surgery

## 2023-07-12 ENCOUNTER — Ambulatory Visit (INDEPENDENT_AMBULATORY_CARE_PROVIDER_SITE_OTHER): Payer: Medicaid Other | Admitting: Family Medicine

## 2023-07-12 ENCOUNTER — Encounter: Payer: Self-pay | Admitting: Family Medicine

## 2023-07-12 VITALS — BP 138/88 | HR 71 | Ht 61.0 in | Wt 166.0 lb

## 2023-07-12 DIAGNOSIS — N760 Acute vaginitis: Secondary | ICD-10-CM

## 2023-07-12 DIAGNOSIS — E7849 Other hyperlipidemia: Secondary | ICD-10-CM | POA: Diagnosis not present

## 2023-07-12 DIAGNOSIS — Z0001 Encounter for general adult medical examination with abnormal findings: Secondary | ICD-10-CM

## 2023-07-12 DIAGNOSIS — Z1211 Encounter for screening for malignant neoplasm of colon: Secondary | ICD-10-CM

## 2023-07-12 DIAGNOSIS — E038 Other specified hypothyroidism: Secondary | ICD-10-CM | POA: Diagnosis not present

## 2023-07-12 DIAGNOSIS — R7301 Impaired fasting glucose: Secondary | ICD-10-CM

## 2023-07-12 DIAGNOSIS — E559 Vitamin D deficiency, unspecified: Secondary | ICD-10-CM | POA: Diagnosis not present

## 2023-07-12 NOTE — Patient Instructions (Addendum)
I appreciate the opportunity to provide care to you today!    Follow up:  4 months  Labs: please stop by the lab today to get your blood drawn (CBC, CMP, TSH, Lipid profile, HgA1c, Vit D)   Attached with your AVS, you will find valuable resources for self-education. I highly recommend dedicating some time to thoroughly examine them.   Please continue to a heart-healthy diet and increase your physical activities. Try to exercise for 30mins at least five days a week.    It was a pleasure to see you and I look forward to continuing to work together on your health and well-being. Please do not hesitate to call the office if you need care or have questions about your care.  In case of emergency, please visit the Emergency Department for urgent care, or contact our clinic at [phone] to schedule an appointment. We're here to help you!   Have a wonderful day and week. With Gratitude, Kylan Veach MSN, FNP-BC  

## 2023-07-12 NOTE — Progress Notes (Unsigned)
Complete physical exam  Patient: Donna [name]   DOB: [birth year]   52 y.o. Female  MRN: [id number]  Subjective:    Chief Complaint  Patient presents with   Annual Exam    Cpe today   Vaginal Itching    Pt reports yeast from treatment back in August from having BV.    Donna [name] is a 52 y.o. female who presents today for a complete physical exam. She reports consuming a {diet types:17450} diet. {types:19826} She generally feels {DESC; WELL/FAIRLY WELL/POORLY:18703}. She reports sleeping {DESC; WELL/FAIRLY WELL/POORLY:18703}. She {does/does not:200015} have additional problems to discuss today.    Most recent fall risk assessment:    04/24/2023   11:18 AM  Fall Risk   Falls in the past year? 1  Number falls in past yr: 0  Injury with Fall? 1  Risk for fall due to : History of fall(s);Impaired balance/gait  Follow up Falls evaluation completed     Most recent depression screenings:    07/12/2023   10:09 AM 04/24/2023   11:18 AM  PHQ 2/9 Scores  PHQ - 2 Score 1 0  PHQ- 9 Score 6 0    {VISON DENTAL STD PSA (Optional):27386}  {History (Optional):23778}  Patient Care Team: Gilmore Laroche, FNP as PCP - General (Family Medicine)   Outpatient Medications Prior to Visit  Medication Sig   aspirin-sod bicarb-citric acid (ALKA-SELTZER) 325 MG TBEF tablet Take 325 mg by mouth once as needed (for headache).   cyclobenzaprine (FLEXERIL) 10 MG tablet Take 1 tablet (10 mg total) by mouth 2 (two) times daily as needed for muscle spasms.   DULoxetine (CYMBALTA) 30 MG capsule Take 1 capsule (30 mg total) by mouth daily.   ferrous sulfate 325 (65 FE) MG EC tablet Take 1 tablet (325 mg total) by mouth daily.   fluticasone (FLONASE) 50 MCG/ACT nasal spray Place 1 spray into both nostrils daily as needed for allergies.   ibuprofen (ADVIL) 600 MG tablet Take 1 tablet (600 mg total) by mouth every 6 (six) hours as needed.   lidocaine (LIDODERM) 5 % Place 1 patch onto the skin daily.  Remove & Discard patch within 12 hours or as directed by MD   valACYclovir (VALTREX) 1000 MG tablet Take 0.5 tablets (500 mg total) by mouth daily as needed (for outbreak).   No facility-administered medications prior to visit.    ROS     Objective:    BP 138/88 (BP Location: Left Arm)   Pulse 71   Ht 5\' 1"  (1.549 m)   Wt 166 lb 0.6 oz (75.3 kg)   SpO2 95%   BMI 31.37 kg/m  {Vitals History (Optional):23777}  Physical Exam  No results found for any visits on 07/12/23. {Show previous labs (optional):23779}    Assessment & Plan:    Routine Health Maintenance and Physical Exam  Immunization History  Administered Date(s) Administered   Tdap 05/12/2020    Health Maintenance  Topic Date Due   COVID-19 Vaccine (1) Never done   Colonoscopy  Never done   MAMMOGRAM  12/15/2022   Zoster Vaccines- Shingrix (1 of 2) 10/12/2023 (Originally 01/26/1991)   INFLUENZA VACCINE  01/08/2024 (Originally 05/11/2023)   Cervical Cancer Screening (HPV/Pap Cotest)  12/25/2025   DTaP/Tdap/Td (2 - Td or Tdap) 05/12/2030   Hepatitis C Screening  Completed   HIV Screening  Completed   HPV VACCINES  Aged Out    Discussed health benefits of physical activity, and encouraged her to engage in  regular exercise appropriate for her age and condition.  Colon cancer screening    No follow-ups on file.     Gilmore Laroche, FNP

## 2023-07-13 ENCOUNTER — Other Ambulatory Visit: Payer: Self-pay

## 2023-07-13 ENCOUNTER — Ambulatory Visit: Payer: Medicaid Other | Attending: Family Medicine | Admitting: Physical Therapy

## 2023-07-13 ENCOUNTER — Encounter: Payer: Self-pay | Admitting: Physical Therapy

## 2023-07-13 DIAGNOSIS — M6281 Muscle weakness (generalized): Secondary | ICD-10-CM | POA: Insufficient documentation

## 2023-07-13 DIAGNOSIS — R252 Cramp and spasm: Secondary | ICD-10-CM | POA: Diagnosis not present

## 2023-07-13 DIAGNOSIS — R262 Difficulty in walking, not elsewhere classified: Secondary | ICD-10-CM | POA: Insufficient documentation

## 2023-07-13 DIAGNOSIS — M5459 Other low back pain: Secondary | ICD-10-CM | POA: Insufficient documentation

## 2023-07-13 DIAGNOSIS — M542 Cervicalgia: Secondary | ICD-10-CM | POA: Diagnosis not present

## 2023-07-13 DIAGNOSIS — R293 Abnormal posture: Secondary | ICD-10-CM | POA: Insufficient documentation

## 2023-07-13 DIAGNOSIS — M545 Low back pain, unspecified: Secondary | ICD-10-CM | POA: Insufficient documentation

## 2023-07-13 DIAGNOSIS — G8929 Other chronic pain: Secondary | ICD-10-CM | POA: Diagnosis not present

## 2023-07-13 LAB — CBC WITH DIFFERENTIAL/PLATELET
Basophils Absolute: 0.1 10*3/uL (ref 0.0–0.2)
Basos: 1 %
EOS (ABSOLUTE): 0.3 10*3/uL (ref 0.0–0.4)
Eos: 5 %
Hematocrit: 41.6 % (ref 34.0–46.6)
Hemoglobin: 12.7 g/dL (ref 11.1–15.9)
Immature Grans (Abs): 0 10*3/uL (ref 0.0–0.1)
Immature Granulocytes: 0 %
Lymphocytes Absolute: 2.4 10*3/uL (ref 0.7–3.1)
Lymphs: 40 %
MCH: 26.7 pg (ref 26.6–33.0)
MCHC: 30.5 g/dL — ABNORMAL LOW (ref 31.5–35.7)
MCV: 88 fL (ref 79–97)
Monocytes Absolute: 0.5 10*3/uL (ref 0.1–0.9)
Monocytes: 8 %
Neutrophils Absolute: 2.8 10*3/uL (ref 1.4–7.0)
Neutrophils: 46 %
Platelets: 295 10*3/uL (ref 150–450)
RBC: 4.75 x10E6/uL (ref 3.77–5.28)
RDW: 13.4 % (ref 11.7–15.4)
WBC: 6 10*3/uL (ref 3.4–10.8)

## 2023-07-13 LAB — TSH+FREE T4
Free T4: 1.45 ng/dL (ref 0.82–1.77)
TSH: 2.64 u[IU]/mL (ref 0.450–4.500)

## 2023-07-13 LAB — HEMOGLOBIN A1C
Est. average glucose Bld gHb Est-mCnc: 114 mg/dL
Hgb A1c MFr Bld: 5.6 % (ref 4.8–5.6)

## 2023-07-13 LAB — CMP14+EGFR
ALT: 18 [IU]/L (ref 0–32)
AST: 20 [IU]/L (ref 0–40)
Albumin: 4.2 g/dL (ref 3.8–4.9)
Alkaline Phosphatase: 93 [IU]/L (ref 44–121)
BUN/Creatinine Ratio: 15 (ref 9–23)
BUN: 13 mg/dL (ref 6–24)
Bilirubin Total: 0.2 mg/dL (ref 0.0–1.2)
CO2: 23 mmol/L (ref 20–29)
Calcium: 9 mg/dL (ref 8.7–10.2)
Chloride: 105 mmol/L (ref 96–106)
Creatinine, Ser: 0.86 mg/dL (ref 0.57–1.00)
Globulin, Total: 2.5 g/dL (ref 1.5–4.5)
Glucose: 85 mg/dL (ref 70–99)
Potassium: 3.9 mmol/L (ref 3.5–5.2)
Sodium: 141 mmol/L (ref 134–144)
Total Protein: 6.7 g/dL (ref 6.0–8.5)
eGFR: 82 mL/min/{1.73_m2} (ref >59)

## 2023-07-13 LAB — LIPID PANEL
Chol/HDL Ratio: 3.9 {ratio} (ref 0.0–4.4)
Cholesterol, Total: 188 mg/dL (ref 100–199)
HDL: 48 mg/dL (ref >39)
LDL Chol Calc (NIH): 118 mg/dL — ABNORMAL HIGH (ref 0–99)
Triglycerides: 122 mg/dL (ref 0–149)
VLDL Cholesterol Cal: 22 mg/dL (ref 5–40)

## 2023-07-13 LAB — VITAMIN D 25 HYDROXY (VIT D DEFICIENCY, FRACTURES): Vit D, 25-Hydroxy: 24.7 ng/mL — ABNORMAL LOW (ref 30.0–100.0)

## 2023-07-13 NOTE — Assessment & Plan Note (Signed)
The patient reports experiencing vaginal irritation but denies any symptoms of abnormal vaginal discharge with odor or pruritus. She suspects that she might have a yeast infection. A NuSwab test is pending to further evaluate her condition.

## 2023-07-13 NOTE — Assessment & Plan Note (Signed)
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
## 2023-07-15 LAB — NUSWAB VAGINITIS PLUS (VG+)
Candida albicans, NAA: NEGATIVE
Candida glabrata, NAA: NEGATIVE
Chlamydia trachomatis, NAA: NEGATIVE
Neisseria gonorrhoeae, NAA: NEGATIVE
Trich vag by NAA: NEGATIVE

## 2023-07-18 ENCOUNTER — Ambulatory Visit: Payer: Medicaid Other | Admitting: Orthopedic Surgery

## 2023-07-20 ENCOUNTER — Telehealth: Payer: Self-pay

## 2023-07-20 ENCOUNTER — Ambulatory Visit: Payer: Medicaid Other

## 2023-07-20 NOTE — Telephone Encounter (Signed)
Call placed to patient's alternate contact since her voice mail box is full.  Spoke with her mother, Donna [name].  Explained we were calling to remind [name] of her appt today at 4:15 pm and reminded of cancellation / no show policy.  Per patients mother, she had an appt to  have her hair done and was then mentioned she had an appt at 4:15 pm.  She thinks that her hair appt may have run over.  Requested she call us and confirm that she will be attending her next appointment or to cancel remaining appts.

## 2023-07-20 NOTE — Telephone Encounter (Signed)
Call placed to patient to inform her of appointment at 4:15 pm today.  No answer and voice mail box is full.

## 2023-07-21 ENCOUNTER — Other Ambulatory Visit: Payer: Self-pay | Admitting: Family Medicine

## 2023-07-21 ENCOUNTER — Ambulatory Visit
Admission: RE | Admit: 2023-07-21 | Discharge: 2023-07-21 | Disposition: A | Discharge status: Home / Self Care | Payer: Medicaid Other | Source: Ambulatory Visit

## 2023-07-21 DIAGNOSIS — Z1231 Encounter for screening mammogram for malignant neoplasm of breast: Secondary | ICD-10-CM

## 2023-07-21 DIAGNOSIS — E559 Vitamin D deficiency, unspecified: Secondary | ICD-10-CM

## 2023-07-21 MED ORDER — VITAMIN D (ERGOCALCIFEROL) 1.25 MG (50000 UNIT) PO CAPS
50000.0000 [IU] | ORAL_CAPSULE | ORAL | 1 refills | Status: DC
Start: 2023-07-21 — End: 2023-08-01

## 2023-07-27 ENCOUNTER — Ambulatory Visit: Payer: Medicaid Other

## 2023-08-01 ENCOUNTER — Telehealth: Payer: Self-pay | Admitting: Family Medicine

## 2023-08-01 ENCOUNTER — Ambulatory Visit: Payer: Medicaid Other

## 2023-08-01 ENCOUNTER — Other Ambulatory Visit: Payer: Self-pay | Admitting: Family Medicine

## 2023-08-01 ENCOUNTER — Other Ambulatory Visit: Payer: Self-pay

## 2023-08-01 DIAGNOSIS — G8929 Other chronic pain: Secondary | ICD-10-CM | POA: Diagnosis not present

## 2023-08-01 DIAGNOSIS — M542 Cervicalgia: Secondary | ICD-10-CM | POA: Diagnosis not present

## 2023-08-01 DIAGNOSIS — R293 Abnormal posture: Secondary | ICD-10-CM | POA: Diagnosis not present

## 2023-08-01 DIAGNOSIS — M5459 Other low back pain: Secondary | ICD-10-CM | POA: Diagnosis not present

## 2023-08-01 DIAGNOSIS — E559 Vitamin D deficiency, unspecified: Secondary | ICD-10-CM

## 2023-08-01 DIAGNOSIS — R252 Cramp and spasm: Secondary | ICD-10-CM | POA: Diagnosis not present

## 2023-08-01 DIAGNOSIS — M545 Low back pain, unspecified: Secondary | ICD-10-CM | POA: Diagnosis not present

## 2023-08-01 DIAGNOSIS — M6281 Muscle weakness (generalized): Secondary | ICD-10-CM | POA: Diagnosis not present

## 2023-08-01 DIAGNOSIS — R262 Difficulty in walking, not elsewhere classified: Secondary | ICD-10-CM | POA: Diagnosis not present

## 2023-08-01 MED ORDER — VITAMIN D (ERGOCALCIFEROL) 1.25 MG (50000 UNIT) PO CAPS
50000.0000 [IU] | ORAL_CAPSULE | ORAL | 1 refills | Status: DC
Start: 2023-08-01 — End: 2023-08-01

## 2023-08-01 MED ORDER — VITAMIN D (ERGOCALCIFEROL) 1.25 MG (50000 UNIT) PO CAPS
50000.0000 [IU] | ORAL_CAPSULE | ORAL | 1 refills | Status: AC
Start: 2023-08-01 — End: ?
  Filled 2023-08-01 – 2024-02-26 (×2): qty 12, 84d supply, fill #0
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
## 2023-08-01 NOTE — Telephone Encounter (Signed)
Patient is calling says her Vitamin D was sent to [facility] but she had wanted it to go to [facility] on [address] in [place] and wants all future refills to go to there. Please advise thank you

## 2023-08-01 NOTE — Telephone Encounter (Signed)
Spoke with pt let her know refill has been sent to [facility] pharmacy

## 2023-08-02 ENCOUNTER — Other Ambulatory Visit: Payer: Self-pay

## 2023-08-03 ENCOUNTER — Ambulatory Visit: Payer: Medicaid Other | Admitting: Physical Therapy

## 2023-08-03 ENCOUNTER — Other Ambulatory Visit: Payer: Self-pay

## 2023-08-04 ENCOUNTER — Other Ambulatory Visit (HOSPITAL_COMMUNITY): Payer: Self-pay

## 2023-08-08 ENCOUNTER — Ambulatory Visit: Payer: Medicaid Other | Admitting: Physical Therapy

## 2023-08-08 ENCOUNTER — Encounter: Payer: Self-pay | Admitting: Physical Therapy

## 2023-08-08 DIAGNOSIS — R293 Abnormal posture: Secondary | ICD-10-CM

## 2023-08-08 DIAGNOSIS — M6281 Muscle weakness (generalized): Secondary | ICD-10-CM | POA: Diagnosis not present

## 2023-08-08 DIAGNOSIS — R262 Difficulty in walking, not elsewhere classified: Secondary | ICD-10-CM | POA: Diagnosis not present

## 2023-08-08 DIAGNOSIS — M542 Cervicalgia: Secondary | ICD-10-CM | POA: Diagnosis not present

## 2023-08-08 DIAGNOSIS — R252 Cramp and spasm: Secondary | ICD-10-CM | POA: Diagnosis not present

## 2023-08-08 DIAGNOSIS — M5459 Other low back pain: Secondary | ICD-10-CM | POA: Diagnosis not present

## 2023-08-08 DIAGNOSIS — M545 Low back pain, unspecified: Secondary | ICD-10-CM | POA: Diagnosis not present

## 2023-08-08 DIAGNOSIS — G8929 Other chronic pain: Secondary | ICD-10-CM | POA: Diagnosis not present

## 2023-08-08 NOTE — Therapy (Signed)
OUTPATIENT PHYSICAL THERAPY THORACOLUMBAR TREATMENT   Patient Name: Donna [name] MRN: [id number] DOB:05-Jan-1971, 52 y.o., female Today's Date: 08/08/2023  END OF SESSION:  PT End of Session - 08/08/23 1703     Visit Number 3    Date for PT Re-Evaluation 09/07/23    Authorization Type [place] MEDICA[employer] BLUE    Authorization Time Period 07/13/2023-09/10/2023    Authorization - Visit Number 2    Authorization - Number of Visits 6    PT Start Time 1616    PT Stop Time 1658    PT Time Calculation (min) 42 min    Activity Tolerance Patient tolerated treatment well    Behavior During Therapy [facility] for tasks assessed/performed              Past Medical History:  Diagnosis Date   Anemia    Contraceptive management 04/21/2014   Environmental allergies 12/30/2014   Herpes    History of anemia 05/05/2016   History of herpes simplex infection 04/21/2014   Irregular bleeding 05/05/2015   Itching in the vaginal area 05/05/2016   Retained tampon 05/05/2015   Umbilical hernia 12/30/2014   Vaginal discharge 05/05/2015   Past Surgical History:  Procedure Laterality Date   BREAST BIOPSY Left 12/2021   CERVICAL CERCLAGE  2010 and 2004   [employer] cerclage   CESAREAN SECTION  10/10/2010   [facility]   UMBILICAL HERNIA REPAIR  01/30/2012   Procedure: HERNIA REPAIR UMBILICAL ADULT;  Surgeon: Fabio Bering, MD;  Location: [facility];  Service: General;  Laterality: N/A;  With mesh   Patient Active Problem List   Diagnosis Date Noted   Right knee pain 04/24/2023   Left knee pain 04/24/2023   Back pain 03/10/2023   Acute vaginitis 04/21/2022   Encounter for annual general medical examination with abnormal findings in adult 01/21/2022   Atopic dermatitis in adult 01/21/2022   Seasonal allergies 01/21/2022   Caregiver stress 01/21/2022   Depression, major, single episode, mild (HCC) 01/21/2022   Trichomoniasis 10/15/2019   Vaginal odor 10/09/2019   Vaginal bleeding 10/09/2019   Screening  examination for STD (sexually transmitted disease) 10/09/2019   Elevated BP without diagnosis of hypertension 10/09/2019   Iron deficiency anemia due to chronic blood loss 10/09/2019   Uterine fibroids 09/11/2017   Screening for colorectal cancer 06/27/2017   History of anemia 05/05/2016   Irregular bleeding 05/05/2015   Retained tampon 05/05/2015   Umbilical hernia 12/30/2014   Environmental allergies 12/30/2014   History of herpes simplex infection 04/21/2014    PCP: Gilmore Laroche  REFERRING PROVIDER: Gilmore Laroche  REFERRING DIAG: M54.50; G89.29- chronic low back pain, unspecified back pain laterality, unspecified whether sciatica present  Rationale for Evaluation and Treatment: Rehabilitation  THERAPY DIAG:  Muscle weakness (generalized)  Abnormal posture  Other low back pain  Cramp and spasm  Difficulty in walking, not elsewhere classified  ONSET DATE: 02/2023  SUBJECTIVE:  SUBJECTIVE STATEMENT: Patient reports she is doing okay today. She is currently having 7/10 back pain and 6/10 knee pain. She felt good after last treatment session she just felt sore. Patient verbalized interest in dry needling.  PERTINENT HISTORY:  Anemia; Depression  PAIN:  08/01/23: Are you having pain? Yes: NPRS scale: 7)/10 Pain location: all over my body Pain description: sharp and achy Aggravating factors: cervical rotation, bending, stairs, getting out of bed Relieving factors: Muscle relaxer  PRECAUTIONS: None  RED FLAGS: None   WEIGHT BEARING RESTRICTIONS: No  FALLS:  Has patient fallen in last 6 months? No  LIVING ENVIRONMENT: Lives with: lives with their family Lives in: House/apartment Stairs: 8 steps inside the house   OCCUPATION: Out of work currently  PLOF:  Independent  PATIENT GOALS: To bet better and get myself back going  NEXT MD VISIT: PRN  OBJECTIVE:  Note: Objective measures were completed at Evaluation unless otherwise noted.   PATIENT SURVEYS:  Modified Oswestry 12/50   SCREENING FOR RED FLAGS: Bowel or bladder incontinence: No Spinal tumors: No Cauda equina syndrome: No Compression fracture: No Abdominal aneurysm: No  COGNITION: Overall cognitive status: Within functional limits for tasks assessed      POSTURE: rounded shoulders, forward head, and increased lumbar lordosis  PALPATION: Decreased joint mobility and increased tenderness throughout lumbar spine Grade I PA Increased muscle spasms on bilateral lumbar erector spinae and bilateral piriformis  LUMBAR ROM:   AROM eval  Flexion WFL pain going down  Extension 25% limited  Right lateral flexion Bend to knee joint line  Left lateral flexion Bend to knee joint line  Right rotation [facility] but pain  Left rotation WFL but pain   (Blank rows = not tested)  LOWER EXTREMITY ROM:   PROM WFL; pain with Lt knee flexion   LOWER EXTREMITY MMT:    MMT Right eval Left eval  Hip flexion 4- 4-  Hip extension    Hip abduction 4 4  Hip adduction 4 4  Hip internal rotation    Hip external rotation    Knee flexion 4- limited by pain 3+ limited by pain  Knee extension 4- limited by pain 3+ limited by pain  Ankle dorsiflexion    Ankle plantarflexion    Ankle inversion    Ankle eversion     (Blank rows = not tested)    GAIT: Comments: decreased cadence, antalgic gait  TODAY'S TREATMENT:                                                                                                                              DATE:  08/08/23: Nustep x 5 min level 1 Standing quad stretch 3 x 30 sec 3 way SB stretch x 8 each direction Supine hamstring stretch 3 x 30 sec Supine hip flexion stretch 4 x 15 sec Supine PPT x 10 5 sec hold Supine PPT with march x 20 Supine PPT  with alternating arms  and legs x 20 Lower trunk rotation x 20 in hook lying Seated TA contraction + hip flexion x 10 each Open book x 5 each side holding 5 sec both sides Seated piriformis stretch 3 x 30 sec   08/01/23: Nustep x 5 min level 1 Standing hamstring stretch 3 x 30 sec Standing quad stretch 3 x 30 sec Supine PPT x 20 Supine PPT with march x 20 Supine PPT with alternating arms and legs x 20 Lower trunk rotation x 20 in hook lying Open book x 5 each side holding 5 sec both sides Seated piriformis stretch x 5 each LE x 10 sec each  07/13/2023 HEP established   If treatment provided at initial evaluation, no treatment charged due to lack of authorization.       PATIENT EDUCATION:  Education details: lumbar mobility; DN handout Person educated: Patient Education method: Explanation, Demonstration, and Handouts Education comprehension: verbalized understanding, returned demonstration, and needs further education  HOME EXERCISE PROGRAM: Access Code: [email or web address] URL: [email or web address] Date: 08/01/2023 Prepared by: Mikey Kirschner  Exercises - Supine Lower Trunk Rotation  - 2 x daily - 7 x weekly - 1 sets - 10 reps - Seated Hamstring Stretch  - 2 x daily - 7 x weekly - 1 sets - 30 hold - Standing 'L' Stretch at Counter  - 2 x daily - 7 x weekly - 1 sets - 5 reps - Prone Quadriceps Stretch with Strap  - 2 x daily - 7 x weekly - 1 sets - 10 reps - Standing Hamstring Stretch on Chair  - 1 x daily - 7 x weekly - 3 sets - 10 reps - Quadricep Stretch with Chair and Counter Support  - 1 x daily - 7 x weekly - 3 sets - 10 reps - Supine Posterior Pelvic Tilt  - 1 x daily - 7 x weekly - 3 sets - 10 reps - Supine 90/90 Alternating Heel Touches with Posterior Pelvic Tilt  - 1 x daily - 7 x weekly - 3 sets - 10 reps - Supine Piriformis Stretch with Foot on Ground  - 1 x daily - 7 x weekly - 3 sets - 10 reps  ASSESSMENT:  CLINICAL IMPRESSION: Today's treatment  session focused on hip and lumbar mobility. Allure continues to have high pain levels that limits her functional mobility.  Patient verbalized feeling sore after last treatment session but did not experience any increased pain. Patient verbalized interest in dry needling, so plan to do that in subsequent sessions. Patient tolerated treatment session well and did not experience any increased pain. She was able to tolerate progressions of core exercises. Patient required verbal cues for correct TA engagement and breathing technique. Patient will benefit from skilled PT to address the below impairments and improve overall function.    OBJECTIVE IMPAIRMENTS: Abnormal gait, decreased mobility, decreased ROM, decreased strength, hypomobility, increased muscle spasms, impaired flexibility, postural dysfunction, and pain.   ACTIVITY LIMITATIONS: lifting, bending, sitting, squatting, sleeping, stairs, and bed mobility  PARTICIPATION LIMITATIONS: driving, community activity, and gardening  PERSONAL FACTORS: Age, Time since onset of injury/illness/exacerbation, and 1 comorbidity: depression  are also affecting patient's functional outcome.   REHAB POTENTIAL: Good  CLINICAL DECISION MAKING: Stable/uncomplicated  EVALUATION COMPLEXITY: Low   GOALS: Goals reviewed with patient? Yes  SHORT TERM GOALS: Target date: 08/10/2023  Patient will be independent with initial HEP. Baseline:  Goal status: INITIAL  2.  Patient will be able to sit for 15 minutes with < or =  to 3/10 back and neck pain. Baseline: 5 minute tolerance 5/10 pain Goal status: INITIAL   LONG TERM GOALS: Target date: 09/07/2023  Patient will demonstrate independence in advanced HEP. Baseline:  Goal status: INITIAL  2.  Patient will be able to complete bed mobility with < or = 2/10 back pain. Baseline:  Goal status: INITIAL   3.  Patient will be able to work in her garden with < or = to 4/10 back pain. Baseline: 8/10  pain Goal status: INITIAL  4.  Patient will be able to negotiate stairs with < 4/10 back pain. Baseline: 8/10 pain Goal status: INITIAL    PLAN:  PT FREQUENCY: 2x/week  PT DURATION: 8 weeks  PLANNED INTERVENTIONS: Therapeutic exercises, Therapeutic activity, Neuromuscular re-education, Balance training, Gait training, Patient/Family education, Self Care, Joint mobilization, Joint manipulation, Stair training, Vestibular training, Canalith repositioning, Aquatic Therapy, Dry Needling, Electrical stimulation, Spinal manipulation, Spinal mobilization, Cryotherapy, Moist heat, Splintting, Taping, Vasopneumatic device, Traction, Ultrasound, Ionotophoresis 4mg /ml Dexamethasone, and Manual therapy.  PLAN FOR NEXT SESSION: continue lumbar & hip stretching; progress core (90/90 heel taps; bent knee fall outs)   Claude Manges, PT 08/08/23 5:05 PM [facility] [address] [place], [place] [address] Phone # [phone] Fax [phone]

## 2023-08-09 ENCOUNTER — Telehealth: Payer: Medicaid Other

## 2023-08-09 DIAGNOSIS — J302 Other seasonal allergic rhinitis: Secondary | ICD-10-CM | POA: Diagnosis not present

## 2023-08-10 ENCOUNTER — Ambulatory Visit: Payer: Medicaid Other

## 2023-08-10 ENCOUNTER — Ambulatory Visit: Payer: Medicaid Other | Admitting: Family Medicine

## 2023-08-10 NOTE — Progress Notes (Signed)
I have spent 5 minutes in review of e-visit questionnaire, review and updating patient chart, medical decision making and response to patient.   Mia Milan Cody Jacklynn Dehaas, PA-C    

## 2023-08-10 NOTE — Progress Notes (Signed)
E visit for Allergic Rhinitis We are sorry that you are not feeling well.  Here is how we plan to help!  Based on what you have shared with me it looks like you have Allergic Rhinitis.  Rhinitis is when a reaction occurs that causes nasal congestion, runny nose, sneezing, and itching.  Most types of rhinitis are caused by an inflammation and are associated with symptoms in the eyes ears or throat. There are several types of rhinitis.  The most common are acute rhinitis, which is usually caused by a viral illness, allergic or seasonal rhinitis, and nonallergic or year-round rhinitis.  Nasal allergies occur certain times of the year.  Allergic rhinitis is caused when allergens in the air trigger the release of histamine in the body.  Histamine causes itching, swelling, and fluid to build up in the fragile linings of the nasal passages, sinuses and eyelids.  An itchy nose and clear discharge are common.  I recommend the following over the counter treatments: You should take a daily dose of antihistamine and Xyzal 5 mg take 1 tablet daily  I also would recommend a nasal spray: Flonase 2 sprays into each nostril once daily  You may also benefit from eye drops such as: Systane 1-2 driops each eye twice daily as needed  HOME CARE:  You can use an over-the-counter saline nasal spray as needed Avoid areas where there is heavy dust, mites, or molds Stay indoors on windy days during the pollen season Keep windows closed in home, at least in bedroom; use air conditioner. Use high-efficiency house air filter Keep windows closed in car, turn AC on re-circulate Avoid playing out with dog during pollen season  GET HELP RIGHT AWAY IF:  If your symptoms do not improve within 10 days You become short of breath You develop yellow or green discharge from your nose for over 3 days You have coughing fits  MAKE SURE YOU:  Understand these instructions Will watch your condition Will get help right away if  you are not doing well or get worse  Thank you for choosing an e-visit. Your e-visit answers were reviewed by a board certified advanced clinical practitioner to complete your personal care plan. Depending upon the condition, your plan could have included both over the counter or prescription medications. Please review your pharmacy choice. Be sure that the pharmacy you have chosen is open so that you can pick up your prescription now.  If there is a problem you may message your provider in MyChart to have the prescription routed to another pharmacy. Your safety is important to [place]. If you have drug allergies check your prescription carefully.  For the next 24 hours, you can use MyChart to ask questions about today's visit, request a non-urgent call back, or ask for a work or school excuse from your e-visit provider. You will get an email in the next two days asking about your experience. I hope that your e-visit has been valuable and will speed your recovery.

## 2023-08-11 ENCOUNTER — Ambulatory Visit: Payer: Medicaid Other

## 2023-08-15 ENCOUNTER — Ambulatory Visit: Payer: Medicaid Other | Admitting: Physical Therapy

## 2023-08-16 ENCOUNTER — Ambulatory Visit: Payer: Medicaid Other | Attending: Family Medicine | Admitting: Physical Therapy

## 2023-08-16 ENCOUNTER — Encounter: Payer: Self-pay | Admitting: Physical Therapy

## 2023-08-16 DIAGNOSIS — R262 Difficulty in walking, not elsewhere classified: Secondary | ICD-10-CM | POA: Insufficient documentation

## 2023-08-16 DIAGNOSIS — R293 Abnormal posture: Secondary | ICD-10-CM | POA: Diagnosis not present

## 2023-08-16 DIAGNOSIS — M542 Cervicalgia: Secondary | ICD-10-CM | POA: Diagnosis not present

## 2023-08-16 DIAGNOSIS — M6281 Muscle weakness (generalized): Secondary | ICD-10-CM | POA: Insufficient documentation

## 2023-08-16 DIAGNOSIS — R252 Cramp and spasm: Secondary | ICD-10-CM | POA: Diagnosis not present

## 2023-08-16 DIAGNOSIS — M5459 Other low back pain: Secondary | ICD-10-CM | POA: Diagnosis not present

## 2023-08-16 NOTE — Therapy (Signed)
OUTPATIENT PHYSICAL THERAPY THORACOLUMBAR TREATMENT   Patient Name: Donna [name] MRN: [id number] DOB:Apr 12, 1971, 52 y.o., female Today's Date: 08/16/2023  END OF SESSION:  PT End of Session - 08/16/23 1010     Visit Number 4    Date for PT Re-Evaluation 09/07/23    Authorization Type [place] MEDICAID HEALTHY BLUE    Authorization Time Period 07/13/2023-09/10/2023    Authorization - Visit Number 3    Authorization - Number of Visits 6    PT Start Time 0930    PT Stop Time 1010    PT Time Calculation (min) 40 min    Activity Tolerance Patient tolerated treatment well    Behavior During Therapy [facility] for tasks assessed/performed               Past Medical History:  Diagnosis Date   Anemia    Contraceptive management 04/21/2014   Environmental allergies 12/30/2014   Herpes    History of anemia 05/05/2016   History of herpes simplex infection 04/21/2014   Irregular bleeding 05/05/2015   Itching in the vaginal area 05/05/2016   Retained tampon 05/05/2015   Umbilical hernia 12/30/2014   Vaginal discharge 05/05/2015   Past Surgical History:  Procedure Laterality Date   BREAST BIOPSY Left 12/2021   CERVICAL CERCLAGE  2010 and 2004   [employer] cerclage   CESAREAN SECTION  10/10/2010   Women's   UMBILICAL HERNIA REPAIR  01/30/2012   Procedure: HERNIA REPAIR UMBILICAL ADULT;  Surgeon: Fabio Bering, MD;  Location: [facility];  Service: General;  Laterality: N/A;  With mesh   Patient Active Problem List   Diagnosis Date Noted   Right knee pain 04/24/2023   Left knee pain 04/24/2023   Back pain 03/10/2023   Acute vaginitis 04/21/2022   Encounter for annual general medical examination with abnormal findings in adult 01/21/2022   Atopic dermatitis in adult 01/21/2022   Seasonal allergies 01/21/2022   Caregiver stress 01/21/2022   Depression, major, single episode, mild (HCC) 01/21/2022   Trichomoniasis 10/15/2019   Vaginal odor 10/09/2019   Vaginal bleeding 10/09/2019   Screening  examination for STD (sexually transmitted disease) 10/09/2019   Elevated BP without diagnosis of hypertension 10/09/2019   Iron deficiency anemia due to chronic blood loss 10/09/2019   Uterine fibroids 09/11/2017   Screening for colorectal cancer 06/27/2017   History of anemia 05/05/2016   Irregular bleeding 05/05/2015   Retained tampon 05/05/2015   Umbilical hernia 12/30/2014   Environmental allergies 12/30/2014   History of herpes simplex infection 04/21/2014    PCP: Gilmore Laroche  REFERRING PROVIDER: Gilmore Laroche  REFERRING DIAG: M54.50; G89.29- chronic low back pain, unspecified back pain laterality, unspecified whether sciatica present  Rationale for Evaluation and Treatment: Rehabilitation  THERAPY DIAG:  Muscle weakness (generalized)  Abnormal posture  Other low back pain  Cramp and spasm  Difficulty in walking, not elsewhere classified  ONSET DATE: 02/2023  SUBJECTIVE:  SUBJECTIVE STATEMENT: Patient reports she is doing okay today. Her back is normally more painful in the mornings. She is currently having 7/10 pain in her back and Lt knee. Since starting therapy her back pain feels a little better. PERTINENT HISTORY:  Anemia; Depression  PAIN:  08/01/23: Are you having pain? Yes: NPRS scale: 7)/10 Pain location: all over my body Pain description: sharp and achy Aggravating factors: cervical rotation, bending, stairs, getting out of bed Relieving factors: Muscle relaxer  PRECAUTIONS: None  RED FLAGS: None   WEIGHT BEARING RESTRICTIONS: No  FALLS:  Has patient fallen in last 6 months? No  LIVING ENVIRONMENT: Lives with: lives with their family Lives in: House/apartment Stairs: 8 steps inside the house   OCCUPATION: Out of work currently  PLOF:  Independent  PATIENT GOALS: To bet better and get myself back going  NEXT MD VISIT: PRN  OBJECTIVE:  Note: Objective measures were completed at Evaluation unless otherwise noted.   PATIENT SURVEYS:  Modified Oswestry 12/50   SCREENING FOR RED FLAGS: Bowel or bladder incontinence: No Spinal tumors: No Cauda equina syndrome: No Compression fracture: No Abdominal aneurysm: No  COGNITION: Overall cognitive status: Within functional limits for tasks assessed      POSTURE: rounded shoulders, forward head, and increased lumbar lordosis  PALPATION: Decreased joint mobility and increased tenderness throughout lumbar spine Grade I PA Increased muscle spasms on bilateral lumbar erector spinae and bilateral piriformis  LUMBAR ROM:   AROM eval  Flexion WFL pain going down  Extension 25% limited  Right lateral flexion Bend to knee joint line  Left lateral flexion Bend to knee joint line  Right rotation [facility] but pain  Left rotation WFL but pain   (Blank rows = not tested)  LOWER EXTREMITY ROM:   PROM WFL; pain with Lt knee flexion   LOWER EXTREMITY MMT:    MMT Right eval Left eval  Hip flexion 4- 4-  Hip extension    Hip abduction 4 4  Hip adduction 4 4  Hip internal rotation    Hip external rotation    Knee flexion 4- limited by pain 3+ limited by pain  Knee extension 4- limited by pain 3+ limited by pain  Ankle dorsiflexion    Ankle plantarflexion    Ankle inversion    Ankle eversion     (Blank rows = not tested)    GAIT: Comments: decreased cadence, antalgic gait  TODAY'S TREATMENT:                                                                                                                              DATE:  08/16/23: Nustep x 5 min level 4 Standing quad stretch 2 x 30 sec each bilateral  3 way SB stretch x 8 each direction Seated piriformis stretch 2 x 30 sec Standing hamstring stretch on 2nd stair 3 x 30 sec each B Lower trunk rotation x 20 in hook  lying Supine  PPT with march x 20 each Supine TA contraction + bent knee fall out x 10 each Attempted Supine TA contraction + knee extension but patient had knee pain Bridging 2 x 8 Open book x 8 each side holding 5 sec both sides Side Steps with green loop x 4  08/08/23: Nustep x 5 min level 1 Standing quad stretch 3 x 30 sec 3 way SB stretch x 8 each direction Supine hamstring stretch 3 x 30 sec Supine hip flexion stretch 4 x 15 sec Supine PPT x 10 5 sec hold Supine PPT with march x 20 Supine PPT with alternating arms and legs x 20 Lower trunk rotation x 20 in hook lying Seated TA contraction + hip flexion x 10 each Open book x 5 each side holding 5 sec both sides Seated piriformis stretch 3 x 30 sec   08/01/23: Nustep x 5 min level 1 Standing hamstring stretch 3 x 30 sec Standing quad stretch 3 x 30 sec Supine PPT x 20 Supine PPT with march x 20 Supine PPT with alternating arms and legs x 20 Lower trunk rotation x 20 in hook lying Open book x 5 each side holding 5 sec both sides Seated piriformis stretch x 5 each LE x 10 sec each     PATIENT EDUCATION:  Education details: lumbar mobility; DN handout Person educated: Patient Education method: Explanation, Demonstration, and Handouts Education comprehension: verbalized understanding, returned demonstration, and needs further education  HOME EXERCISE PROGRAM: Access Code: [email or web address] URL: [email or web address] Date: 08/01/2023 Prepared by: Mikey Kirschner  Exercises - Supine Lower Trunk Rotation  - 2 x daily - 7 x weekly - 1 sets - 10 reps - Seated Hamstring Stretch  - 2 x daily - 7 x weekly - 1 sets - 30 hold - Standing 'L' Stretch at Counter  - 2 x daily - 7 x weekly - 1 sets - 5 reps - Prone Quadriceps Stretch with Strap  - 2 x daily - 7 x weekly - 1 sets - 10 reps - Standing Hamstring Stretch on Chair  - 1 x daily - 7 x weekly - 3 sets - 10 reps - Quadricep Stretch with Chair and Counter Support  - 1  x daily - 7 x weekly - 3 sets - 10 reps - Supine Posterior Pelvic Tilt  - 1 x daily - 7 x weekly - 3 sets - 10 reps - Supine 90/90 Alternating Heel Touches with Posterior Pelvic Tilt  - 1 x daily - 7 x weekly - 3 sets - 10 reps - Supine Piriformis Stretch with Foot on Ground  - 1 x daily - 7 x weekly - 3 sets - 10 reps  ASSESSMENT:  CLINICAL IMPRESSION: Today's treatment session focused on hip and lumbar mobility. Patience continues to have high pain levels, but she reported some improvements in her back pain. She tolerated treatment session well and experienced mild increases in knee pain. She has been compliant with her HEP and verbalized doing a few exercises a day. Educated patient that she can go back to her gym routine and educated her on exercises she can perform. Patient will benefit from skilled PT to address the below impairments and improve overall function.    OBJECTIVE IMPAIRMENTS: Abnormal gait, decreased mobility, decreased ROM, decreased strength, hypomobility, increased muscle spasms, impaired flexibility, postural dysfunction, and pain.   ACTIVITY LIMITATIONS: lifting, bending, sitting, squatting, sleeping, stairs, and bed mobility  PARTICIPATION LIMITATIONS: driving, community activity, and gardening  PERSONAL FACTORS: Age,  Time since onset of injury/illness/exacerbation, and 1 comorbidity: depression  are also affecting patient's functional outcome.   REHAB POTENTIAL: Good  CLINICAL DECISION MAKING: Stable/uncomplicated  EVALUATION COMPLEXITY: Low   GOALS: Goals reviewed with patient? Yes  SHORT TERM GOALS: Target date: 08/10/2023  Patient will be independent with initial HEP. Baseline:  Goal status: MET 08/16/2023  2.  Patient will be able to sit for 15 minutes with < or = to 3/10 back and neck pain. Baseline: 5 minute tolerance 5/10 pain Goal status: MET 08/16/2023 Patient can sit for 20 minutes before experiencing pain   LONG TERM GOALS: Target date:  09/07/2023  Patient will demonstrate independence in advanced HEP. Baseline:  Goal status: INITIAL  2.  Patient will be able to complete bed mobility with < or = 2/10 back pain. Baseline:  Goal status: INITIAL   3.  Patient will be able to work in her garden with < or = to 4/10 back pain. Baseline: 8/10 pain Goal status: INITIAL  4.  Patient will be able to negotiate stairs with < 4/10 back pain. Baseline: 8/10 pain Goal status: INITIAL    PLAN:  PT FREQUENCY: 2x/week  PT DURATION: 8 weeks  PLANNED INTERVENTIONS: Therapeutic exercises, Therapeutic activity, Neuromuscular re-education, Balance training, Gait training, Patient/Family education, Self Care, Joint mobilization, Joint manipulation, Stair training, Vestibular training, Canalith repositioning, Aquatic Therapy, Dry Needling, Electrical stimulation, Spinal manipulation, Spinal mobilization, Cryotherapy, Moist heat, Splintting, Taping, Vasopneumatic device, Traction, Ultrasound, Ionotophoresis 4mg /ml Dexamethasone, and Manual therapy.  PLAN FOR NEXT SESSION: seated hinging; standing hinging   Claude Manges, PT 08/16/23 10:11 AM [facility] [address], [address] [place], [place] [address] Phone # [phone] Fax [phone]

## 2023-08-22 ENCOUNTER — Encounter: Payer: Medicaid Other | Admitting: Physical Therapy

## 2023-08-23 ENCOUNTER — Ambulatory Visit: Payer: Medicaid Other

## 2023-08-23 DIAGNOSIS — R252 Cramp and spasm: Secondary | ICD-10-CM | POA: Diagnosis not present

## 2023-08-23 DIAGNOSIS — R293 Abnormal posture: Secondary | ICD-10-CM

## 2023-08-23 DIAGNOSIS — R262 Difficulty in walking, not elsewhere classified: Secondary | ICD-10-CM

## 2023-08-23 DIAGNOSIS — M6281 Muscle weakness (generalized): Secondary | ICD-10-CM

## 2023-08-23 DIAGNOSIS — M542 Cervicalgia: Secondary | ICD-10-CM

## 2023-08-23 DIAGNOSIS — M5459 Other low back pain: Secondary | ICD-10-CM

## 2023-08-23 NOTE — Therapy (Signed)
OUTPATIENT PHYSICAL THERAPY THORACOLUMBAR TREATMENT   Patient Name: Donna [name] MRN: [id number] DOB:11-10-1970, 52 y.o., female Today's Date: 08/23/2023  END OF SESSION:  PT End of Session - 08/23/23 0803     Visit Number 5    Date for PT Re-Evaluation 09/07/23    Authorization Type [place] MEDICAID HEALTHY BLUE    Authorization Time Period 07/13/2023-09/10/2023    Authorization - Visit Number 5    Authorization - Number of Visits 6    PT Start Time 0803    PT Stop Time 0838    PT Time Calculation (min) 35 min    Activity Tolerance Patient tolerated treatment well    Behavior During Therapy [facility] for tasks assessed/performed               Past Medical History:  Diagnosis Date   Anemia    Contraceptive management 04/21/2014   Environmental allergies 12/30/2014   Herpes    History of anemia 05/05/2016   History of herpes simplex infection 04/21/2014   Irregular bleeding 05/05/2015   Itching in the vaginal area 05/05/2016   Retained tampon 05/05/2015   Umbilical hernia 12/30/2014   Vaginal discharge 05/05/2015   Past Surgical History:  Procedure Laterality Date   BREAST BIOPSY Left 12/2021   CERVICAL CERCLAGE  2010 and 2004   [employer] cerclage   CESAREAN SECTION  10/10/2010   Women's   UMBILICAL HERNIA REPAIR  01/30/2012   Procedure: HERNIA REPAIR UMBILICAL ADULT;  Surgeon: Fabio Bering, MD;  Location: [facility];  Service: General;  Laterality: N/A;  With mesh   Patient Active Problem List   Diagnosis Date Noted   Right knee pain 04/24/2023   Left knee pain 04/24/2023   Back pain 03/10/2023   Acute vaginitis 04/21/2022   Encounter for annual general medical examination with abnormal findings in adult 01/21/2022   Atopic dermatitis in adult 01/21/2022   Seasonal allergies 01/21/2022   Caregiver stress 01/21/2022   Depression, major, single episode, mild (HCC) 01/21/2022   Trichomoniasis 10/15/2019   Vaginal odor 10/09/2019   Vaginal bleeding 10/09/2019   Screening  examination for STD (sexually transmitted disease) 10/09/2019   Elevated BP without diagnosis of hypertension 10/09/2019   Iron deficiency anemia due to chronic blood loss 10/09/2019   Uterine fibroids 09/11/2017   Screening for colorectal cancer 06/27/2017   History of anemia 05/05/2016   Irregular bleeding 05/05/2015   Retained tampon 05/05/2015   Umbilical hernia 12/30/2014   Environmental allergies 12/30/2014   History of herpes simplex infection 04/21/2014    PCP: Gilmore Laroche  REFERRING PROVIDER: Gilmore Laroche  REFERRING DIAG: M54.50; G89.29- chronic low back pain, unspecified back pain laterality, unspecified whether sciatica present  Rationale for Evaluation and Treatment: Rehabilitation  THERAPY DIAG:  Muscle weakness (generalized)  Abnormal posture  Other low back pain  Cramp and spasm  Difficulty in walking, not elsewhere classified  Cervicalgia  ONSET DATE: 02/2023  SUBJECTIVE:  SUBJECTIVE STATEMENT: Patient reports she is feeling okay. "I guess its the weather but it's about an 8/10 today".  "Both knees still hurt and my neck hurts too".    PERTINENT HISTORY:  Anemia; Depression  PAIN:  08/23/23: Are you having pain? Yes: NPRS scale: 8/10 Pain location: all over my body Pain description: sharp and achy Aggravating factors: cervical rotation, bending, stairs, getting out of bed Relieving factors: Muscle relaxer  PRECAUTIONS: None  RED FLAGS: None   WEIGHT BEARING RESTRICTIONS: No  FALLS:  Has patient fallen in last 6 months? No  LIVING ENVIRONMENT: Lives with: lives with their family Lives in: House/apartment Stairs: 8 steps inside the house   OCCUPATION: Out of work currently  PLOF: Independent  PATIENT GOALS: To bet better and get myself back  going  NEXT MD VISIT: PRN  OBJECTIVE:  Note: Objective measures were completed at Evaluation unless otherwise noted.   PATIENT SURVEYS:  Modified Oswestry 12/50   SCREENING FOR RED FLAGS: Bowel or bladder incontinence: No Spinal tumors: No Cauda equina syndrome: No Compression fracture: No Abdominal aneurysm: No  COGNITION: Overall cognitive status: Within functional limits for tasks assessed      POSTURE: rounded shoulders, forward head, and increased lumbar lordosis  PALPATION: Decreased joint mobility and increased tenderness throughout lumbar spine Grade I PA Increased muscle spasms on bilateral lumbar erector spinae and bilateral piriformis  LUMBAR ROM:   AROM eval  Flexion WFL pain going down  Extension 25% limited  Right lateral flexion Bend to knee joint line  Left lateral flexion Bend to knee joint line  Right rotation [facility] but pain  Left rotation WFL but pain   (Blank rows = not tested)  LOWER EXTREMITY ROM:   PROM WFL; pain with Lt knee flexion   LOWER EXTREMITY MMT:    MMT Right eval Left eval  Hip flexion 4- 4-  Hip extension    Hip abduction 4 4  Hip adduction 4 4  Hip internal rotation    Hip external rotation    Knee flexion 4- limited by pain 3+ limited by pain  Knee extension 4- limited by pain 3+ limited by pain  Ankle dorsiflexion    Ankle plantarflexion    Ankle inversion    Ankle eversion     (Blank rows = not tested)    GAIT: Comments: decreased cadence, antalgic gait  TODAY'S TREATMENT:                                                                                                                              DATE:  08/23/23: Nustep x 5 min level 4 Standing hamstring stretch 3 x 30 sec both Standing quad stretch 3 x 30 sec both Hook lying PPT x 20 PPT with 90/90 heel tap x 20 PPT with dying bug x 20  Educated patient on her varus and recurvatum postures; encouraged her to avoid standing with her knees locked. Trigger  Point  Dry-Needling  Treatment instructions: Expect mild to moderate muscle soreness. S/S of pneumothorax if dry needled over a lung field, and to seek immediate medical attention should they occur. Patient verbalized understanding of these instructions and education. Patient Consent Given: Yes Education handout provided: Yes Muscles treated: lumbar multifidi Electrical stimulation performed: No Parameters: N/A Treatment response/outcome: Skilled palpation used to identify taut bands and trigger points.  Once identified, dry needling techniques used to treat these areas.  Twitch response ellicited along with palpable elongation of muscle.  Following treatment, patient states "I feel like I can move better.  I definitely feel relief".  Educated patient on use of ice if she becomes overly sore.     08/16/23: Nustep x 5 min level 4 Standing quad stretch 2 x 30 sec each bilateral  3 way SB stretch x 8 each direction Seated piriformis stretch 2 x 30 sec Standing hamstring stretch on 2nd stair 3 x 30 sec each B Lower trunk rotation x 20 in hook lying Supine PPT with march x 20 each Supine TA contraction + bent knee fall out x 10 each Attempted Supine TA contraction + knee extension but patient had knee pain Bridging 2 x 8 Open book x 8 each side holding 5 sec both sides Side Steps with green loop x 4  08/08/23: Nustep x 5 min level 1 Standing quad stretch 3 x 30 sec 3 way SB stretch x 8 each direction Supine hamstring stretch 3 x 30 sec Supine hip flexion stretch 4 x 15 sec Supine PPT x 10 5 sec hold Supine PPT with march x 20 Supine PPT with alternating arms and legs x 20 Lower trunk rotation x 20 in hook lying Seated TA contraction + hip flexion x 10 each Open book x 5 each side holding 5 sec both sides Seated piriformis stretch 3 x 30 sec     PATIENT EDUCATION:  Education details: lumbar mobility; DN handout Person educated: Patient Education method: Explanation, Demonstration,  and Handouts Education comprehension: verbalized understanding, returned demonstration, and needs further education  HOME EXERCISE PROGRAM: Access Code: [email or web address] URL: [email or web address] Date: 08/01/2023 Prepared by: Mikey Kirschner  Exercises - Supine Lower Trunk Rotation  - 2 x daily - 7 x weekly - 1 sets - 10 reps - Seated Hamstring Stretch  - 2 x daily - 7 x weekly - 1 sets - 30 hold - Standing 'L' Stretch at Counter  - 2 x daily - 7 x weekly - 1 sets - 5 reps - Prone Quadriceps Stretch with Strap  - 2 x daily - 7 x weekly - 1 sets - 10 reps - Standing Hamstring Stretch on Chair  - 1 x daily - 7 x weekly - 3 sets - 10 reps - Quadricep Stretch with Chair and Counter Support  - 1 x daily - 7 x weekly - 3 sets - 10 reps - Supine Posterior Pelvic Tilt  - 1 x daily - 7 x weekly - 3 sets - 10 reps - Supine 90/90 Alternating Heel Touches with Posterior Pelvic Tilt  - 1 x daily - 7 x weekly - 3 sets - 10 reps - Supine Piriformis Stretch with Foot on Ground  - 1 x daily - 7 x weekly - 3 sets - 10 reps  ASSESSMENT:  CLINICAL IMPRESSION: Melady continues to be extremely restricted in hip flexors  L > R.  This is causing her increased lordosis and difficulty with doing core exercises, trying to maintain posterior pelvic position.  She was agreeable to DN today.  She responded very well despite her apprehension.  She reported relief of symptoms at end of session.  She has one visit left on her authorization.  We will do re-assessment and assess response to DN and request further visits if she seems to be making progress. Patient will benefit from skilled PT to address the below impairments and improve overall function.    OBJECTIVE IMPAIRMENTS: Abnormal gait, decreased mobility, decreased ROM, decreased strength, hypomobility, increased muscle spasms, impaired flexibility, postural dysfunction, and pain.   ACTIVITY LIMITATIONS: lifting, bending, sitting, squatting, sleeping,  stairs, and bed mobility  PARTICIPATION LIMITATIONS: driving, community activity, and gardening  PERSONAL FACTORS: Age, Time since onset of injury/illness/exacerbation, and 1 comorbidity: depression  are also affecting patient's functional outcome.   REHAB POTENTIAL: Good  CLINICAL DECISION MAKING: Stable/uncomplicated  EVALUATION COMPLEXITY: Low   GOALS: Goals reviewed with patient? Yes  SHORT TERM GOALS: Target date: 08/10/2023  Patient will be independent with initial HEP. Baseline:  Goal status: MET 08/16/2023  2.  Patient will be able to sit for 15 minutes with < or = to 3/10 back and neck pain. Baseline: 5 minute tolerance 5/10 pain Goal status: MET 08/16/2023 Patient can sit for 20 minutes before experiencing pain   LONG TERM GOALS: Target date: 09/07/2023  Patient will demonstrate independence in advanced HEP. Baseline:  Goal status: In progress  2.  Patient will be able to complete bed mobility with < or = 2/10 back pain. Baseline:  Goal status: In progress   3.  Patient will be able to work in her garden with < or = to 4/10 back pain. Baseline: 8/10 pain Goal status: In progress  4.  Patient will be able to negotiate stairs with < 4/10 back pain. Baseline: 8/10 pain Goal status: In progress    PLAN:  PT FREQUENCY: 2x/week  PT DURATION: 8 weeks  PLANNED INTERVENTIONS: Therapeutic exercises, Therapeutic activity, Neuromuscular re-education, Balance training, Gait training, Patient/Family education, Self Care, Joint mobilization, Joint manipulation, Stair training, Vestibular training, Canalith repositioning, Aquatic Therapy, Dry Needling, Electrical stimulation, Spinal manipulation, Spinal mobilization, Cryotherapy, Moist heat, Splintting, Taping, Vasopneumatic device, Traction, Ultrasound, Ionotophoresis 4mg /ml Dexamethasone, and Manual therapy.  PLAN FOR NEXT SESSION: Assess response to DN,  re-assessment to decide on continuing PT, hip hinging, LE  flexibility and core strengthening.     Victorino Dike B. Michie Molnar, PT 08/23/23 8:47 AM  [facility] [address] [place], [place] [address] Phone # [phone] Fax [phone]

## 2023-08-25 ENCOUNTER — Encounter: Payer: Self-pay | Admitting: Gastroenterology

## 2023-08-25 ENCOUNTER — Ambulatory Visit: Payer: Medicaid Other | Admitting: Physical Therapy

## 2023-08-25 DIAGNOSIS — M6281 Muscle weakness (generalized): Secondary | ICD-10-CM

## 2023-08-25 DIAGNOSIS — M542 Cervicalgia: Secondary | ICD-10-CM | POA: Diagnosis not present

## 2023-08-25 DIAGNOSIS — R293 Abnormal posture: Secondary | ICD-10-CM | POA: Diagnosis not present

## 2023-08-25 DIAGNOSIS — M5459 Other low back pain: Secondary | ICD-10-CM | POA: Diagnosis not present

## 2023-08-25 DIAGNOSIS — R262 Difficulty in walking, not elsewhere classified: Secondary | ICD-10-CM | POA: Diagnosis not present

## 2023-08-25 DIAGNOSIS — R252 Cramp and spasm: Secondary | ICD-10-CM | POA: Diagnosis not present

## 2023-08-25 NOTE — Therapy (Signed)
OUTPATIENT PHYSICAL THERAPY THORACOLUMBAR TREATMENT/RECERTIFICATION   Patient Name: Donna [name] MRN: [id number] DOB:1970-11-20, 52 y.o., female Today's Date: 08/25/2023  END OF SESSION:  PT End of Session - 08/25/23 0758     Visit Number 6    Date for PT Re-Evaluation 10/20/23    Authorization Type [place] MEDICAID HEALTHY BLUE 6 visits will submit for more    Authorization - Visit Number 6    Authorization - Number of Visits 6    PT Start Time 0800    PT Stop Time 0840    PT Time Calculation (min) 40 min    Activity Tolerance Patient tolerated treatment well               Past Medical History:  Diagnosis Date   Anemia    Contraceptive management 04/21/2014   Environmental allergies 12/30/2014   Herpes    History of anemia 05/05/2016   History of herpes simplex infection 04/21/2014   Irregular bleeding 05/05/2015   Itching in the vaginal area 05/05/2016   Retained tampon 05/05/2015   Umbilical hernia 12/30/2014   Vaginal discharge 05/05/2015   Past Surgical History:  Procedure Laterality Date   BREAST BIOPSY Left 12/2021   CERVICAL CERCLAGE  2010 and 2004   [employer] cerclage   CESAREAN SECTION  10/10/2010   [facility]   UMBILICAL HERNIA REPAIR  01/30/2012   Procedure: HERNIA REPAIR UMBILICAL ADULT;  Surgeon: Fabio Bering, MD;  Location: [facility];  Service: General;  Laterality: N/A;  With mesh   Patient Active Problem List   Diagnosis Date Noted   Right knee pain 04/24/2023   Left knee pain 04/24/2023   Back pain 03/10/2023   Acute vaginitis 04/21/2022   Encounter for annual general medical examination with abnormal findings in adult 01/21/2022   Atopic dermatitis in adult 01/21/2022   Seasonal allergies 01/21/2022   Caregiver stress 01/21/2022   Depression, major, single episode, mild (HCC) 01/21/2022   Trichomoniasis 10/15/2019   Vaginal odor 10/09/2019   Vaginal bleeding 10/09/2019   Screening examination for STD (sexually transmitted disease) 10/09/2019    Elevated BP without diagnosis of hypertension 10/09/2019   Iron deficiency anemia due to chronic blood loss 10/09/2019   Uterine fibroids 09/11/2017   Screening for colorectal cancer 06/27/2017   History of anemia 05/05/2016   Irregular bleeding 05/05/2015   Retained tampon 05/05/2015   Umbilical hernia 12/30/2014   Environmental allergies 12/30/2014   History of herpes simplex infection 04/21/2014    PCP: Gilmore Laroche  REFERRING PROVIDER: Gilmore Laroche  REFERRING DIAG: M54.50; G89.29- chronic low back pain, unspecified back pain laterality, unspecified whether sciatica present  Rationale for Evaluation and Treatment: Rehabilitation  THERAPY DIAG:  Other low back pain  Muscle weakness (generalized)  Abnormal posture  ONSET DATE: 02/2023  SUBJECTIVE:  SUBJECTIVE STATEMENT: Still sore from the DN.  Some pain in back, right LE including knee and neck;  also left knee pain; I'm in a lot of pain now. I was mostly in the bed all day yesterday.    PERTINENT HISTORY:  Anemia; Depression 11/15:Pt states she has been told she may need a left TKR  PAIN:   Are you having pain? Yes: NPRS scale: 9/10 Pain location: back, neck, right LE, left knee Pain description: sharp and achy Aggravating factors: cervical rotation, bending, stairs, getting out of bed Relieving factors: Muscle relaxer  PRECAUTIONS: None  RED FLAGS: None   WEIGHT BEARING RESTRICTIONS: No  FALLS:  Has patient fallen in last 6 months? No  LIVING ENVIRONMENT: Lives with: lives with their family Lives in: House/apartment Stairs: 8 steps inside the house   OCCUPATION: Out of work currently  PLOF: Independent  PATIENT GOALS: To bet better and get myself back going  NEXT MD VISIT: PRN  OBJECTIVE:  Note: Objective  measures were completed at Evaluation unless otherwise noted.   PATIENT SURVEYS:  Modified Oswestry 12/50  11/15:  11/50 22%   SCREENING FOR RED FLAGS: Bowel or bladder incontinence: No Spinal tumors: No Cauda equina syndrome: No Compression fracture: No Abdominal aneurysm: No  COGNITION: Overall cognitive status: Within functional limits for tasks assessed      POSTURE: rounded shoulders, forward head, and increased lumbar lordosis  PALPATION: Decreased joint mobility and increased tenderness throughout lumbar spine Grade I PA Increased muscle spasms on bilateral lumbar erector spinae and bilateral piriformis  LUMBAR ROM:   AROM eval 11/15  Flexion WFL pain going down 70  Extension 25% limited 30 little pain  Right lateral flexion Bend to knee joint line 15 pain  Left lateral flexion Bend to knee joint line 35  Right rotation [facility] but pain   Left rotation WFL but pain    (Blank rows = not tested) 11/15: TRUNK STRENGTH:  Decreased activation of transverse abdominus muscles; abdominals 4-/5; decreased activation of lumbar multifidi; trunk extensors 4-/5   LOWER EXTREMITY ROM:   PROM WFL; pain with Lt knee flexion   LOWER EXTREMITY MMT:    MMT Right eval Left eval 11/15  Hip flexion 4- 4- 4- B  Hip extension     Hip abduction 4 4 4- B  Hip adduction 4 4   Hip internal rotation     Hip external rotation     Knee flexion 4- limited by pain 3+ limited by pain   Knee extension 4- limited by pain 3+ limited by pain   Ankle dorsiflexion     Ankle plantarflexion     Ankle inversion     Ankle eversion      (Blank rows = not tested)  11/15: SLS 3-5 sec right/left with pelvic drop  GAIT: Comments: decreased cadence, antalgic gait  TODAY'S TREATMENT:  DATE:  08/25/23: Nustep x 5 min level 3  blue machine Modified Oswestry Lumbar  ROM Strength assessment see above Sidelying clam 10x right/left (added to HEP) Open books right 10x (decreased pain in right QL muscle) added to HEP Seated right QL stretch 10x reach UE up and over added to HEP Standing doorway option for QL stretch added to HEP Discussed option of aquatic PT  08/23/23: Nustep x 5 min level 4 Standing hamstring stretch 3 x 30 sec both Standing quad stretch 3 x 30 sec both Hook lying PPT x 20 PPT with 90/90 heel tap x 20 PPT with dying bug x 20  Educated patient on her varus and recurvatum postures; encouraged her to avoid standing with her knees locked. Trigger Point Dry-Needling  Treatment instructions: Expect mild to moderate muscle soreness. S/S of pneumothorax if dry needled over a lung field, and to seek immediate medical attention should they occur. Patient verbalized understanding of these instructions and education. Patient Consent Given: Yes Education handout provided: Yes Muscles treated: lumbar multifidi Electrical stimulation performed: No Parameters: N/A Treatment response/outcome: Skilled palpation used to identify taut bands and trigger points.  Once identified, dry needling techniques used to treat these areas.  Twitch response ellicited along with palpable elongation of muscle.  Following treatment, patient states "I feel like I can move better.  I definitely feel relief".  Educated patient on use of ice if she becomes overly sore.     08/16/23: Nustep x 5 min level 4 Standing quad stretch 2 x 30 sec each bilateral  3 way SB stretch x 8 each direction Seated piriformis stretch 2 x 30 sec Standing hamstring stretch on 2nd stair 3 x 30 sec each B Lower trunk rotation x 20 in hook lying Supine PPT with march x 20 each Supine TA contraction + bent knee fall out x 10 each Attempted Supine TA contraction + knee extension but patient had knee pain Bridging 2 x 8 Open book x 8 each side holding 5 sec both sides Side Steps with green loop  x 4  08/08/23: Nustep x 5 min level 1 Standing quad stretch 3 x 30 sec 3 way SB stretch x 8 each direction Supine hamstring stretch 3 x 30 sec Supine hip flexion stretch 4 x 15 sec Supine PPT x 10 5 sec hold Supine PPT with march x 20 Supine PPT with alternating arms and legs x 20 Lower trunk rotation x 20 in hook lying Seated TA contraction + hip flexion x 10 each Open book x 5 each side holding 5 sec both sides Seated piriformis stretch 3 x 30 sec     PATIENT EDUCATION:  Education details: lumbar mobility; DN handout Person educated: Patient Education method: Explanation, Demonstration, and Handouts Education comprehension: verbalized understanding, returned demonstration, and needs further education  HOME EXERCISE PROGRAM: Access Code: [email or web address] URL: [email or web address] Date: 08/25/2023 Prepared by: Lavinia Sharps  Exercises - Supine Lower Trunk Rotation  - 2 x daily - 7 x weekly - 1 sets - 10 reps - Seated Hamstring Stretch  - 2 x daily - 7 x weekly - 1 sets - 30 hold - Standing 'L' Stretch at Counter  - 2 x daily - 7 x weekly - 1 sets - 5 reps - Prone Quadriceps Stretch with Strap  - 2 x daily - 7 x weekly - 1 sets - 10 reps - Standing Hamstring Stretch on Chair  - 1 x daily - 7 x weekly - 3 sets -  10 reps - Theatre manager with Chair and Counter Support  - 1 x daily - 7 x weekly - 3 sets - 10 reps - Supine Posterior Pelvic Tilt  - 1 x daily - 7 x weekly - 3 sets - 10 reps - Supine 90/90 Alternating Heel Touches with Posterior Pelvic Tilt  - 1 x daily - 7 x weekly - 3 sets - 10 reps - Supine Piriformis Stretch with Foot on Ground  - 1 x daily - 7 x weekly - 3 sets - 10 reps - Clamshell  - 1 x daily - 7 x weekly - 1 sets - 10 reps - Sidelying Open Book Thoracic Rotation with Knee on Foam Roll  - 1 x daily - 7 x weekly - 1 sets - 10 reps - Seated Quadratus Lumborum Stretch in Chair  - 1 x daily - 7 x weekly - 1 sets - 10 reps - Standing Quadratus Lumborum  Stretch with Doorway (Mirrored)  - 1 x daily - 7 x weekly - 1 sets - 10 reps ASSESSMENT:  CLINICAL IMPRESSION: Progress has been slower secondary to multiple body regions affected and painful.  Her pain level is very high 9/10 on arrival but following exercise is decreased to a 6-7/10. She would benefit from further PT to address hip strength (particularly glute medius) as well as lumbo/pelvic stability and strength.  She would also  benefit from further DN and manual therapy to right quadratus lumborum tender points and myofascial restriction.  Aquatic PT would be helpful secondary to knee joint pathology and pain with weight bearing.    OBJECTIVE IMPAIRMENTS: Abnormal gait, decreased mobility, decreased ROM, decreased strength, hypomobility, increased muscle spasms, impaired flexibility, postural dysfunction, and pain.   ACTIVITY LIMITATIONS: lifting, bending, sitting, squatting, sleeping, stairs, and bed mobility  PARTICIPATION LIMITATIONS: driving, community activity, and gardening  PERSONAL FACTORS: Age, Time since onset of injury/illness/exacerbation, and 1 comorbidity: depression  are also affecting patient's functional outcome.   REHAB POTENTIAL: Good  CLINICAL DECISION MAKING: Stable/uncomplicated  EVALUATION COMPLEXITY: Low   GOALS: Goals reviewed with patient? Yes  SHORT TERM GOALS: Target date: 08/10/2023  Patient will be independent with initial HEP. Baseline:  Goal status: MET 08/16/2023  2.  Patient will be able to sit for 15 minutes with < or = to 3/10 back and neck pain. Baseline: 5 minute tolerance 5/10 pain Goal status: MET 08/16/2023 Patient can sit for 20 minutes before experiencing pain   LONG TERM GOALS: Target date: 10/20/2023  Patient will demonstrate independence in advanced HEP. Baseline:  Goal status: In progress  2.  Patient will be able to complete bed mobility with < or = 2/10 back pain. Baseline:  Goal status: In progress   3.  Patient will  be able to work in her garden with < or = to 4/10 back pain. Baseline: 8/10 pain Goal status: In progress  4.  Patient will be able to negotiate stairs with < 4/10 back pain. Baseline: 8/10 pain Goal status: In progress    PLAN:  PT FREQUENCY: 2x/week  PT DURATION: 8 weeks  PLANNED INTERVENTIONS: Therapeutic exercises, Therapeutic activity, Neuromuscular re-education, Balance training, Gait training, Patient/Family education, Self Care, Joint mobilization, Joint manipulation, Stair training, Vestibular training, Canalith repositioning, Aquatic Therapy, Dry Needling, Electrical stimulation, Spinal manipulation, Spinal mobilization, Cryotherapy, Moist heat, Splintting, Taping, Vasopneumatic device, Traction, Ultrasound, Ionotophoresis 4mg /ml Dexamethasone, and Manual therapy.  PLAN FOR NEXT SESSION: consider DN QL,  will request more visits; aquatic PT; hip mobilizations;  LE flexibility and core strengthening.    Lavinia Sharps, PT 08/25/23 10:39 AM Phone: [phone] Fax: [phone]  [facility] [address], [address] [place], [place] [address] Phone # [phone] Fax [phone]

## 2023-08-29 ENCOUNTER — Encounter: Payer: Medicaid Other | Admitting: Physical Therapy

## 2023-08-29 ENCOUNTER — Ambulatory Visit: Payer: Medicaid Other | Admitting: Physical Therapy

## 2023-09-11 ENCOUNTER — Ambulatory Visit: Payer: Medicaid Other | Admitting: Physical Therapy

## 2023-09-20 ENCOUNTER — Other Ambulatory Visit: Payer: Self-pay

## 2023-09-20 ENCOUNTER — Ambulatory Visit (AMBULATORY_SURGERY_CENTER): Payer: Medicaid Other

## 2023-09-20 ENCOUNTER — Ambulatory Visit (HOSPITAL_COMMUNITY): Payer: Medicaid Other

## 2023-09-20 VITALS — Ht 61.0 in | Wt 166.0 lb

## 2023-09-20 DIAGNOSIS — Z1211 Encounter for screening for malignant neoplasm of colon: Secondary | ICD-10-CM

## 2023-09-20 MED ORDER — PEG 3350-KCL-NA BICARB-NACL 420 G PO SOLR
4000.0000 mL | Freq: Once | ORAL | 0 refills | Status: AC
Start: 2023-09-20 — End: 2023-09-20

## 2023-09-20 NOTE — Progress Notes (Signed)
Denies allergies to eggs or soy products. Denies complication of anesthesia or sedation. Denies use of weight loss medication. Denies use of O2.   Emmi instructions given for colonoscopy.  

## 2023-09-22 ENCOUNTER — Ambulatory Visit (HOSPITAL_COMMUNITY): Payer: Medicaid Other

## 2023-09-28 ENCOUNTER — Ambulatory Visit: Payer: Medicaid Other

## 2023-10-02 ENCOUNTER — Telehealth: Payer: Self-pay

## 2023-10-02 NOTE — Telephone Encounter (Signed)
Patient left message wanting to see if she could just walk in and get injections.  I called patient back and was not able to speak with her so I was going to leave a  message but her mailbox was full.

## 2023-10-03 ENCOUNTER — Ambulatory Visit
Admission: RE | Admit: 2023-10-03 | Discharge: 2023-10-03 | Disposition: A | Discharge status: Home / Self Care | Payer: Medicaid Other | Source: Ambulatory Visit | Attending: Emergency Medicine | Admitting: Emergency Medicine

## 2023-10-03 VITALS — BP 142/91 | HR 67 | Temp 97.6°F | Resp 17

## 2023-10-03 DIAGNOSIS — G8929 Other chronic pain: Secondary | ICD-10-CM | POA: Diagnosis not present

## 2023-10-03 DIAGNOSIS — M25562 Pain in left knee: Secondary | ICD-10-CM | POA: Diagnosis not present

## 2023-10-03 DIAGNOSIS — M25561 Pain in right knee: Secondary | ICD-10-CM | POA: Diagnosis not present

## 2023-10-03 MED ORDER — KETOROLAC TROMETHAMINE 30 MG/ML IJ SOLN
30.0000 mg | Freq: Once | INTRAMUSCULAR | Status: AC
  Administered 2023-10-03: 30 mg via INTRAMUSCULAR

## 2023-10-03 NOTE — Discharge Instructions (Signed)
The Toradol injection given today should start to work in about 30 minutes. Please do not use any NSAIDs (ibuprofen/Advil, naproxen/Aleve, etc) for the next 12 hours. You can safely use tylenol.   Please follow up with orthopedic specialist.

## 2023-10-03 NOTE — ED Triage Notes (Signed)
Pt presents with c/o bilateral knee pain. Pt states she can "barely walk". States the pain comes back when it is cold.

## 2023-10-03 NOTE — ED Provider Notes (Signed)
[facility]    CSN: [id number] Arrival date & time: 10/03/23  1147     History   Chief Complaint Chief Complaint  Patient presents with   Knee Pain    Need injections like a cortisone shot . - Entered by patient   HPI Donna [name] is a 52 y.o. female.  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  Has tried PT. She sees orthopedics in . Had steroid injection last year. Next appointment is 1 week from today No new injury or trauma. Reports the cold makes it worse   Past Medical History:  Diagnosis Date   Allergy    Anemia    Anxiety    Arthritis    Contraceptive management 04/21/2014   Environmental allergies 12/30/2014   Herpes    History of anemia 05/05/2016   History of herpes simplex infection 04/21/2014   Irregular bleeding 05/05/2015   Itching in the vaginal area 05/05/2016   Retained tampon 05/05/2015   Umbilical hernia 12/30/2014   Vaginal discharge 05/05/2015    Patient Active Problem List   Diagnosis Date Noted   Right knee pain 04/24/2023   Left knee pain 04/24/2023   Back pain 03/10/2023   Acute vaginitis 04/21/2022   Encounter for annual general medical examination with abnormal findings in adult 01/21/2022   Atopic dermatitis in adult 01/21/2022   Seasonal allergies 01/21/2022   Caregiver stress 01/21/2022   Depression, major, single episode, mild (HCC) 01/21/2022   Trichomoniasis 10/15/2019   Vaginal odor 10/09/2019   Vaginal bleeding 10/09/2019   Screening examination for STD (sexually transmitted disease) 10/09/2019   Elevated BP without diagnosis of hypertension 10/09/2019   Iron deficiency anemia due to chronic blood loss 10/09/2019   Uterine fibroids 09/11/2017   Screening for colorectal cancer 06/27/2017   History of anemia 05/05/2016   Irregular bleeding 05/05/2015   Retained tampon 05/05/2015   Umbilical hernia 12/30/2014    Environmental allergies 12/30/2014   History of herpes simplex infection 04/21/2014    Past Surgical History:  Procedure Laterality Date   BREAST BIOPSY Left 12/2021   CERVICAL CERCLAGE  2010 and 2004   [employer] cerclage   CESAREAN SECTION  10/10/2010   [facility]   HERNIA REPAIR     umbilical   UMBILICAL HERNIA REPAIR  01/30/2012   Procedure: HERNIA REPAIR UMBILICAL ADULT;  Surgeon: Fabio Bering, MD;  Location: [facility];  Service: General;  Laterality: N/A;  With mesh    OB History     Gravida  5   Para  3   Term      Preterm      AB  2   Living  3      SAB  2   IAB      Ectopic      Multiple      Live Births               Home Medications    Prior to Admission medications   Medication Sig Start Date End Date Taking? Authorizing Provider  aspirin-sod bicarb-citric acid (ALKA-SELTZER) 325 MG TBEF tablet Take 325 mg by mouth once as needed (for headache).    [provider]  cyclobenzaprine (FLEXERIL) 10 MG tablet Take 1 tablet (10 mg total) by mouth 2 (two) times daily as needed for muscle spasms. 02/27/23   Carmel Sacramento A, PA-C  fluticasone (FLONASE) 50 MCG/ACT nasal spray Place 1  spray into both nostrils daily as needed for allergies.    [provider]  ibuprofen (ADVIL) 600 MG tablet Take 1 tablet (600 mg total) by mouth every 6 (six) hours as needed. 02/27/23   Carmel Sacramento A, PA-C  lidocaine (LIDODERM) 5 % Place 1 patch onto the skin daily. Remove & Discard patch within 12 hours or as directed by MD 02/27/23   Carmel Sacramento A, PA-C  valACYclovir (VALTREX) 1000 MG tablet Take 0.5 tablets (500 mg total) by mouth daily as needed (for outbreak). 12/16/20   Adline Potter, NP  Vitamin D, Ergocalciferol, (DRISDOL) 1.25 MG (50000 UNIT) CAPS capsule Take 1 capsule (50,000 Units total) by mouth every 7 (seven) days. 08/01/23   Gilmore Laroche, FNP  cetirizine (ZYRTEC) 10 MG tablet Take 1 tablet (10 mg total) by mouth daily as needed  for allergies. 09/18/20 02/02/21  Adline Potter, NP    Family History Family History  Problem Relation Age of Onset   Diabetes Mother    Hypertension Mother    Diabetes Father    Hypertension Father    Multiple sclerosis Father    Cancer Maternal Grandfather        prostate   Asthma Daughter    Asthma Daughter    Anesthesia problems Neg Hx    Hypotension Neg Hx    Malignant hyperthermia Neg Hx    Pseudochol deficiency Neg Hx    Colon cancer Neg Hx    Esophageal cancer Neg Hx    Stomach cancer Neg Hx    Rectal cancer Neg Hx     Social History Social History   Tobacco Use   Smoking status: Never   Smokeless tobacco: Never  Vaping Use   Vaping status: Never Used  Substance Use Topics   Alcohol use: Yes    Comment: special occasions   Drug use: Never     Allergies   Sulfa antibiotics   Review of Systems Review of Systems Per HPI  Physical Exam Triage Vital Signs ED Triage Vitals  Encounter Vitals Group     BP      Systolic BP Percentile      Diastolic BP Percentile      Pulse      Resp      Temp      Temp src      SpO2      Weight      Height      Head Circumference      Peak Flow      Pain Score      Pain Loc      Pain Education      Exclude from Growth Chart    No data found.  Updated Vital Signs BP (!) 142/91   Pulse 67   Temp 97.6 F (36.4 C) (Oral)   Resp 17   LMP 11/18/2021 (Approximate)   SpO2 98%   Visual Acuity Right Eye Distance:   Left Eye Distance:   Bilateral Distance:    Right Eye Near:   Left Eye Near:    Bilateral Near:     Physical Exam Vitals and nursing note reviewed.  Constitutional:      General: She is not in acute distress. HENT:     Mouth/Throat:     Pharynx: Oropharynx is clear.  Cardiovascular:     Rate and Rhythm: Normal rate and regular rhythm.     Pulses: Normal pulses.  Pulmonary:     Effort: Pulmonary  effort is normal.  Musculoskeletal:        General: Tenderness present.      Cervical back: Normal range of motion.     Right lower leg: No edema.     Left lower leg: No edema.     Comments: Left knee decreased ROM. Left knee tender medially. Distal sensation intact. Strong DP pulse.  Skin:    Capillary Refill: Capillary refill takes less than 2 seconds.     Comments: No swelling, abrasion, bruising  Neurological:     Mental Status: She is alert and oriented to person, place, and time.     UC Treatments / Results  Labs (all labs ordered are listed, but only abnormal results are displayed) Labs Reviewed - No data to display  EKG   Radiology No results found.  Procedures Procedures (including critical care time)  Medications Ordered in UC Medications  ketorolac (TORADOL) 30 MG/ML injection 30 mg (has no administration in time range)    Initial Impression / Assessment and Plan / UC Course  I have reviewed the triage vital signs and the nursing notes.  Pertinent labs & imaging results that were available during my care of the patient were reviewed by me and considered in my medical decision making (see chart for details).  Chronic knee pain Discussed urgent care does not perform intraarticular injections, she will need to follow with her orthopedic specialist. Appointment is in 1 week. She has good kidney function, offered IM toradol today. Provided with 2 new ortho clinics per patient request  Final Clinical Impressions(s) / UC Diagnoses   Final diagnoses:  Chronic pain of both knees     Discharge Instructions      The Toradol injection given today should start to work in about 30 minutes. Please do not use any NSAIDs (ibuprofen/Advil, naproxen/Aleve, etc) for the next 12 hours. You can safely use tylenol.   Please follow up with orthopedic specialist.       ED Prescriptions   None    PDMP not reviewed this encounter.   Kathrine Haddock 10/03/23 1236

## 2023-10-09 ENCOUNTER — Encounter: Payer: Self-pay | Admitting: Physical Therapy

## 2023-10-09 ENCOUNTER — Encounter: Payer: Medicaid Other | Admitting: Gastroenterology

## 2023-10-09 ENCOUNTER — Ambulatory Visit: Payer: Medicaid Other | Attending: Family Medicine | Admitting: Physical Therapy

## 2023-10-09 DIAGNOSIS — M6281 Muscle weakness (generalized): Secondary | ICD-10-CM | POA: Insufficient documentation

## 2023-10-09 DIAGNOSIS — R293 Abnormal posture: Secondary | ICD-10-CM | POA: Diagnosis not present

## 2023-10-09 DIAGNOSIS — M542 Cervicalgia: Secondary | ICD-10-CM | POA: Diagnosis not present

## 2023-10-09 DIAGNOSIS — R252 Cramp and spasm: Secondary | ICD-10-CM | POA: Diagnosis not present

## 2023-10-09 DIAGNOSIS — M5459 Other low back pain: Secondary | ICD-10-CM | POA: Insufficient documentation

## 2023-10-09 NOTE — Patient Instructions (Signed)
     [facility] Welcome to [facility]! Here you will find all the information you will need regarding your pool therapy. If you have further questions at any time, please call our office at [phone]. After completing your initial evaluation in the [facility], you may be eligible to complete a portion of your therapy in the pool. A typical week of therapy will consist of 1-2 typical physical therapy visits at our [facility] location and an additional session of therapy in the pool located at the [facility] [address]. [address], [place] [address]. The phone number at the pool site is [phone]. Please call this number if you are running late or need to cancel your appointment.  Aquatic therapy will be offered on Wednesday mornings and Friday afternoons. Each session will last approximately 45 minutes. All scheduling and payments for aquatic therapy sessions, including cancelations, will be done through our [facility] location.  To be eligible for aquatic therapy, these criteria must be met: You must be able to independently change in the locker room and get to the pool deck. A caregiver can come with you to help if needed. There are benches for a caregiver to sit on next to the pool. No one with an open wound is permitted in the pool.  Handicap parking is available in the front and there is a drop off option for even closer accessibility. Please arrive 15 minutes prior to your appointment to prepare for your pool session. You must sign in at the front desk upon your arrival. Please be sure to attend to any toileting needs prior to entering the pool. Locker rooms for changing are available.  There is direct access to the pool deck from the locker room. You can lock your belongings in a locker or bring them with you poolside. Your therapist will greet you on the pool deck. There may be other swimmers in the pool at the  same time but your session is one-on-one with the therapist.   

## 2023-10-09 NOTE — Therapy (Addendum)
OUTPATIENT PHYSICAL THERAPY THORACOLUMBAR TREATMENT   Patient Name: Donna [name] MRN: [id number] DOB:Nov 01, 1970, 52 y.o., female Today's Date: 10/09/2023  END OF SESSION:  PT End of Session - 10/09/23 1324     Visit Number 7    Date for PT Re-Evaluation 10/23/23    Authorization Type [employer] APPROVED 5 VISITS-08/25/2023-10/23/2023    Authorization Time Period 08/25/2023-10/23/2023    Authorization - Visit Number 2    Authorization - Number of Visits 5    Progress Note Due on Visit 10    PT Start Time 1150    PT Stop Time 1246    PT Time Calculation (min) 56 min    Activity Tolerance Patient tolerated treatment well    Behavior During Therapy [facility] for tasks assessed/performed                Past Medical History:  Diagnosis Date   Allergy    Anemia    Anxiety    Arthritis    Contraceptive management 04/21/2014   Environmental allergies 12/30/2014   Herpes    History of anemia 05/05/2016   History of herpes simplex infection 04/21/2014   Irregular bleeding 05/05/2015   Itching in the vaginal area 05/05/2016   Retained tampon 05/05/2015   Umbilical hernia 12/30/2014   Vaginal discharge 05/05/2015   Past Surgical History:  Procedure Laterality Date   BREAST BIOPSY Left 12/2021   CERVICAL CERCLAGE  2010 and 2004   [employer] cerclage   CESAREAN SECTION  10/10/2010   Women's   HERNIA REPAIR     umbilical   UMBILICAL HERNIA REPAIR  01/30/2012   Procedure: HERNIA REPAIR UMBILICAL ADULT;  Surgeon: Fabio Bering, MD;  Location: [facility];  Service: General;  Laterality: N/A;  With mesh   Patient Active Problem List   Diagnosis Date Noted   Right knee pain 04/24/2023   Left knee pain 04/24/2023   Back pain 03/10/2023   Acute vaginitis 04/21/2022   Encounter for annual general medical examination with abnormal findings in adult 01/21/2022   Atopic dermatitis in adult 01/21/2022   Seasonal allergies 01/21/2022   Caregiver stress 01/21/2022   Depression, major,  single episode, mild (HCC) 01/21/2022   Trichomoniasis 10/15/2019   Vaginal odor 10/09/2019   Vaginal bleeding 10/09/2019   Screening examination for STD (sexually transmitted disease) 10/09/2019   Elevated BP without diagnosis of hypertension 10/09/2019   Iron deficiency anemia due to chronic blood loss 10/09/2019   Uterine fibroids 09/11/2017   Screening for colorectal cancer 06/27/2017   History of anemia 05/05/2016   Irregular bleeding 05/05/2015   Retained tampon 05/05/2015   Umbilical hernia 12/30/2014   Environmental allergies 12/30/2014   History of herpes simplex infection 04/21/2014    PCP: Gilmore Laroche  REFERRING PROVIDER: Gilmore Laroche  REFERRING DIAG: M54.50; G89.29- chronic low back pain, unspecified back pain laterality, unspecified whether sciatica present  Rationale for Evaluation and Treatment: Rehabilitation  THERAPY DIAG:  Other low back pain  Muscle weakness (generalized)  Abnormal posture  Cramp and spasm  Cervicalgia  ONSET DATE: 02/2023  SUBJECTIVE:  SUBJECTIVE STATEMENT: Patient presents to therapy today with increased pain for the last three weeks. She when to urgent care Christmas Eve due to her pain.     PERTINENT HISTORY:  Anemia; Depression 11/15:Pt states she has been told she may need a left TKR  PAIN: 10/09/2023  Are you having pain? Yes: NPRS scale: 10/10 Pain location: back, neck, right LE, left knee Pain description: sharp and achy Aggravating factors: cervical rotation, bending, stairs, getting out of bed Relieving factors: Muscle relaxer  PRECAUTIONS: None  RED FLAGS: None   WEIGHT BEARING RESTRICTIONS: No  FALLS:  Has patient fallen in last 6 months? No  LIVING ENVIRONMENT: Lives with: lives with their family Lives in:  House/apartment Stairs: 8 steps inside the house   OCCUPATION: Out of work currently  PLOF: Independent  PATIENT GOALS: To bet better and get myself back going  NEXT MD VISIT: PRN  OBJECTIVE:  Note: Objective measures were completed at Evaluation unless otherwise noted.   PATIENT SURVEYS:  Modified Oswestry 12/50  11/15:  11/50 22%   SCREENING FOR RED FLAGS: Bowel or bladder incontinence: No Spinal tumors: No Cauda equina syndrome: No Compression fracture: No Abdominal aneurysm: No  COGNITION: Overall cognitive status: Within functional limits for tasks assessed      POSTURE: rounded shoulders, forward head, and increased lumbar lordosis  PALPATION: Decreased joint mobility and increased tenderness throughout lumbar spine Grade I PA Increased muscle spasms on bilateral lumbar erector spinae and bilateral piriformis  LUMBAR ROM:   AROM eval 11/15  Flexion WFL pain going down 70  Extension 25% limited 30 little pain  Right lateral flexion Bend to knee joint line 15 pain  Left lateral flexion Bend to knee joint line 35  Right rotation [facility] but pain   Left rotation WFL but pain    (Blank rows = not tested) 11/15: TRUNK STRENGTH:  Decreased activation of transverse abdominus muscles; abdominals 4-/5; decreased activation of lumbar multifidi; trunk extensors 4-/5   LOWER EXTREMITY ROM:   PROM WFL; pain with Lt knee flexion   LOWER EXTREMITY MMT:    MMT Right eval Left eval 11/15  Hip flexion 4- 4- 4- B  Hip extension     Hip abduction 4 4 4- B  Hip adduction 4 4   Hip internal rotation     Hip external rotation     Knee flexion 4- limited by pain 3+ limited by pain   Knee extension 4- limited by pain 3+ limited by pain   Ankle dorsiflexion     Ankle plantarflexion     Ankle inversion     Ankle eversion      (Blank rows = not tested)  11/15: SLS 3-5 sec right/left with pelvic drop  GAIT: Comments: decreased cadence, antalgic gait  TODAY'S  TREATMENT:  DATE:  10/09/23: Discussion of pain levels & how patient has been doing; benefit of getting a knee replacement; aquatic therapy benefit Seated knee flexion with slider 2 x 10 bilateral  Supine LTR x 10 each direction 3 way SB stretch x 8 each direction Supine Quad set (towel under knee) x 20 bilateral  Cold Pack bilateral knees x 10 mins at end of session   08/25/23: Nustep x 5 min level 3  blue machine Modified Oswestry Lumbar ROM Strength assessment see above Sidelying clam 10x right/left (added to HEP) Open books right 10x (decreased pain in right QL muscle) added to HEP Seated right QL stretch 10x reach UE up and over added to HEP Standing doorway option for QL stretch added to HEP Discussed option of aquatic PT  08/23/23: Nustep x 5 min level 4 Standing hamstring stretch 3 x 30 sec both Standing quad stretch 3 x 30 sec both Hook lying PPT x 20 PPT with 90/90 heel tap x 20 PPT with dying bug x 20  Educated patient on her varus and recurvatum postures; encouraged her to avoid standing with her knees locked. Trigger Point Dry-Needling  Treatment instructions: Expect mild to moderate muscle soreness. S/S of pneumothorax if dry needled over a lung field, and to seek immediate medical attention should they occur. Patient verbalized understanding of these instructions and education. Patient Consent Given: Yes Education handout provided: Yes Muscles treated: lumbar multifidi Electrical stimulation performed: No Parameters: N/A Treatment response/outcome: Skilled palpation used to identify taut bands and trigger points.  Once identified, dry needling techniques used to treat these areas.  Twitch response ellicited along with palpable elongation of muscle.  Following treatment, patient states "I feel like I can move better.  I definitely feel relief".   Educated patient on use of ice if she becomes overly sore.       PATIENT EDUCATION:  Education details: lumbar mobility; DN handout Person educated: Patient Education method: Explanation, Demonstration, and Handouts Education comprehension: verbalized understanding, returned demonstration, and needs further education  HOME EXERCISE PROGRAM: Access Code: [email or web address] URL: [email or web address] Date: 08/25/2023 Prepared by: Lavinia Sharps  Exercises - Supine Lower Trunk Rotation  - 2 x daily - 7 x weekly - 1 sets - 10 reps - Seated Hamstring Stretch  - 2 x daily - 7 x weekly - 1 sets - 30 hold - Standing 'L' Stretch at Counter  - 2 x daily - 7 x weekly - 1 sets - 5 reps - Prone Quadriceps Stretch with Strap  - 2 x daily - 7 x weekly - 1 sets - 10 reps - Standing Hamstring Stretch on Chair  - 1 x daily - 7 x weekly - 3 sets - 10 reps - Quadricep Stretch with Chair and Counter Support  - 1 x daily - 7 x weekly - 3 sets - 10 reps - Supine Posterior Pelvic Tilt  - 1 x daily - 7 x weekly - 3 sets - 10 reps - Supine 90/90 Alternating Heel Touches with Posterior Pelvic Tilt  - 1 x daily - 7 x weekly - 3 sets - 10 reps - Supine Piriformis Stretch with Foot on Ground  - 1 x daily - 7 x weekly - 3 sets - 10 reps - Clamshell  - 1 x daily - 7 x weekly - 1 sets - 10 reps - Sidelying Open Book Thoracic Rotation with Knee on Foam Roll  - 1 x daily - 7 x weekly - 1 sets - 10  reps - Seated Quadratus Lumborum Stretch in Chair  - 1 x daily - 7 x weekly - 1 sets - 10 reps - Standing Quadratus Lumborum Stretch with Doorway (Mirrored)  - 1 x daily - 7 x weekly - 1 sets - 10 reps ASSESSMENT:  CLINICAL IMPRESSION: Patient presents to therapy after 7 weeks due to needing insurance approval. Patient has been in increased pain over the last 3 weeks and she has not been moving around a lot. She is considering getting a knee replacement since her knee pain is getting a lot worse. Educated patient on the  benefit of getting a knee replacement. Patient has an appointment scheduled with her MD tomorrow to further discuss her knee pain. Her Lt knee is normally worse, but her Rt knee pain has been progressively getting worse. Scheduled patient for one aquatic therapy session next week to see how she tolerates it. Patient will benefit from skilled PT to address the below impairments and improve overall function.   OBJECTIVE IMPAIRMENTS: Abnormal gait, decreased mobility, decreased ROM, decreased strength, hypomobility, increased muscle spasms, impaired flexibility, postural dysfunction, and pain.   ACTIVITY LIMITATIONS: lifting, bending, sitting, squatting, sleeping, stairs, and bed mobility  PARTICIPATION LIMITATIONS: driving, community activity, and gardening  PERSONAL FACTORS: Age, Time since onset of injury/illness/exacerbation, and 1 comorbidity: depression  are also affecting patient's functional outcome.   REHAB POTENTIAL: Good  CLINICAL DECISION MAKING: Stable/uncomplicated  EVALUATION COMPLEXITY: Low   GOALS: Goals reviewed with patient? Yes  SHORT TERM GOALS: Target date: 08/10/2023  Patient will be independent with initial HEP. Baseline:  Goal status: MET 08/16/2023  2.  Patient will be able to sit for 15 minutes with < or = to 3/10 back and neck pain. Baseline: 5 minute tolerance 5/10 pain Goal status: MET 08/16/2023 Patient can sit for 20 minutes before experiencing pain   LONG TERM GOALS: Target date: 10/20/2023  Patient will demonstrate independence in advanced HEP. Baseline:  Goal status: In progress  2.  Patient will be able to complete bed mobility with < or = 2/10 back pain. Baseline:  Goal status: In progress   3.  Patient will be able to work in her garden with < or = to 4/10 back pain. Baseline: 8/10 pain Goal status: In progress  4.  Patient will be able to negotiate stairs with < 4/10 back pain. Baseline: 8/10 pain Goal status: In  progress    PLAN:  PT FREQUENCY: 2x/week  PT DURATION: 8 weeks  PLANNED INTERVENTIONS: Therapeutic exercises, Therapeutic activity, Neuromuscular re-education, Balance training, Gait training, Patient/Family education, Self Care, Joint mobilization, Joint manipulation, Stair training, Vestibular training, Canalith repositioning, Aquatic Therapy, Dry Needling, Electrical stimulation, Spinal manipulation, Spinal mobilization, Cryotherapy, Moist heat, Splintting, Taping, Vasopneumatic device, Traction, Ultrasound, Ionotophoresis 4mg /ml Dexamethasone, and Manual therapy.  PLAN FOR NEXT SESSION: ask about MD appointment about knees; possibly DN to quads & low back; assess pain tolerance. Lumbar & hip mobility  PHYSICAL THERAPY DISCHARGE SUMMARY  Visits from Start of Care: 7  Current functional level related to goals / functional outcomes: Patient is being discharge due to poor attendance record. Notified patient via MyChart   Remaining deficits: See above   Education / Equipment: See above   Patient agrees to discharge. Patient goals were partially met. Patient is being discharged due to not returning since the last visit.  Claude Manges, PT 10/09/23 1:25 PM [facility] [address], [address] [place], [place] [address] Phone # [phone] Fax [phone]

## 2023-10-10 ENCOUNTER — Encounter: Payer: Self-pay | Admitting: Orthopedic Surgery

## 2023-10-10 ENCOUNTER — Ambulatory Visit: Payer: Medicaid Other | Admitting: Orthopedic Surgery

## 2023-10-10 VITALS — BP 146/99 | HR 82 | Ht 61.0 in | Wt 173.0 lb

## 2023-10-10 DIAGNOSIS — G8929 Other chronic pain: Secondary | ICD-10-CM

## 2023-10-10 DIAGNOSIS — M25562 Pain in left knee: Secondary | ICD-10-CM | POA: Diagnosis not present

## 2023-10-10 DIAGNOSIS — M25561 Pain in right knee: Secondary | ICD-10-CM

## 2023-10-10 MED ORDER — CYCLOBENZAPRINE HCL 10 MG PO TABS: 10.0000 mg | ORAL_TABLET | Freq: Two times a day (BID) | ORAL | 0 refills | Status: DC | PRN

## 2023-10-10 NOTE — Progress Notes (Signed)
 Orthopaedic Clinic Return  Assessment: Donna [name] is a 52 y.o. female with the following: Bilateral knee pain; varus alignment  Plan: Donna [name] has had recurrence of pain in the left knee, and is having pain in the right knee as well.  No recent injury.  She continues to work with therapy.  Acute worsening of her pain recently.  Previously injected the left knee.  Due to the worsening pain in the right knee, she is interested in an injection in the right knee as well.  Both knees were injected today.  She also expressed interest in hyaluronic acid injections.  She is aware that she will need to contact the clinic, in order to obtain authorization prior to scheduling these injections.   Procedure note injection Right knee joint   Verbal consent was obtained to inject the right knee joint  Timeout was completed to confirm the site of injection.  The skin was prepped with alcohol and ethyl chloride was sprayed at the injection site.  A 21-gauge needle was used to inject 40 mg of Depo-Medrol  and 1% lidocaine  (4 cc) into the right knee using an anterolateral approach.  There were no complications. A sterile bandage was applied.   Procedure note injection Left knee joint   Verbal consent was obtained to inject the left knee joint  Timeout was completed to confirm the site of injection.  The skin was prepped with alcohol and ethyl chloride was sprayed at the injection site.  A 21-gauge needle was used to inject 40 mg of Depo-Medrol  and 1% lidocaine  (4 cc) into the left knee using an anterolateral approach.  There were no complications. A sterile bandage was applied.      Follow-up: Return if symptoms worsen or fail to improve.   Subjective:  Chief Complaint  Patient presents with   Knee Pain    Bilat knee pain R getting worse that L.     History of Present Illness: Donna [name] is a 52 y.o. female who returns to clinic for evaluation of left knee pain.  She is also having  pain in the right knee.  Pain is progressively worsening.  No specific injuries recently.  She is previously had injections in the left knee.  She is interested in injections in both knees.  She has had a second opinion related to [employer] injury of the left knee, and recommendations are similar.  The other provider discussed gel injections, and she is here to ask more questions.   Review of Systems: No fevers or chills No numbness or tingling No chest pain No shortness of breath No bowel or bladder dysfunction No GI distress No headaches   Objective: BP (!) 146/99   Pulse 82   Ht 5' 1 (1.549 m)   Wt 173 lb (78.5 kg)   LMP 11/18/2021 (Approximate)   BMI 32.69 kg/m   Physical Exam:  Left knee with varus alignment.  No effusion.  Mild tenderness palpation along the medial joint line.  Mild tenderness to palpation over the medial tibial plateau.  She has good range of motion.  Good strength.  No increased laxity varus valgus stress.  Negative Lachman.  The brace is fitting appropriately.     Right knee with varus alignment.  No effusion.  Tenderness palpation over the medial joint line.  Good range of motion.  Negative Lachman.  No increased laxity varus or valgus stress.   IMAGING: No new imaging obtained today.  Oneil DELENA Horde, MD  10/10/2023 1:57 PM

## 2023-10-10 NOTE — Patient Instructions (Signed)

## 2023-10-13 ENCOUNTER — Ambulatory Visit: Payer: Medicaid Other

## 2023-10-15 IMAGING — MG MM BREAST LOCALIZATION CLIP
6 series · 6 of 18 positions shown · non-contrast
Comparison: Previous exam(s).

CLINICAL DATA: Post ultrasound-guided biopsy of a mass in the left
breast at the 8 o'clock position.

EXAM:
3D DIAGNOSTIC LEFT MAMMOGRAM POST ULTRASOUND BIOPSY

[L MLO synth-2D]
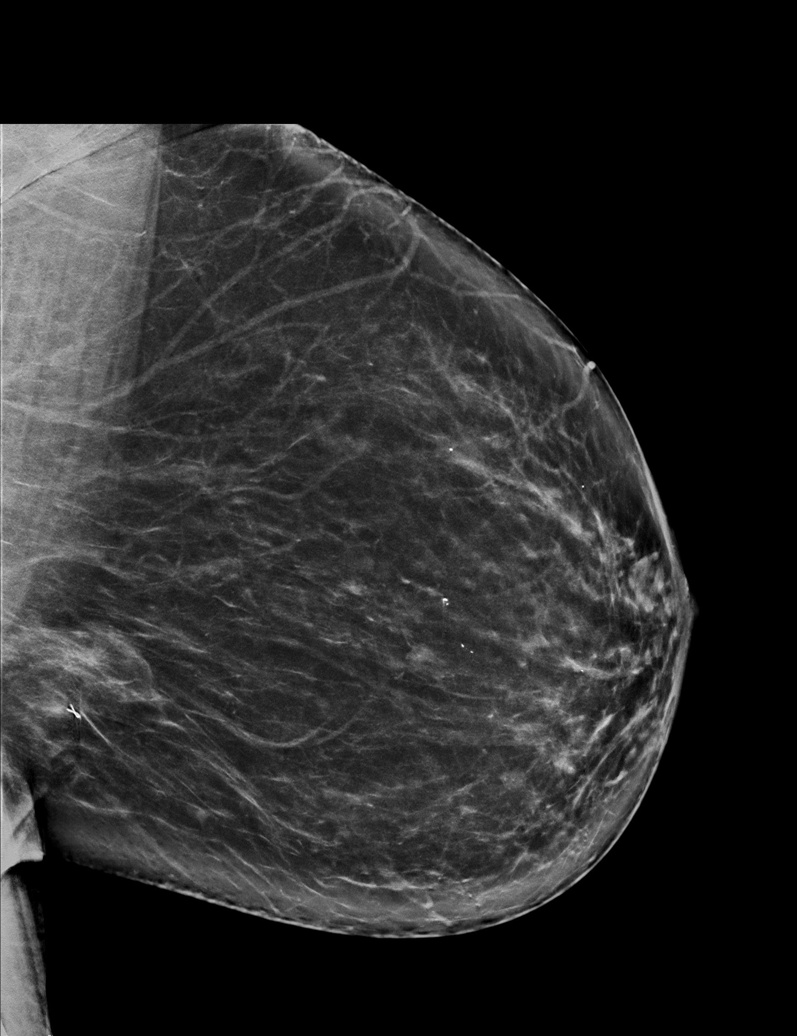

[L CC synth-2D (1 of 2)]
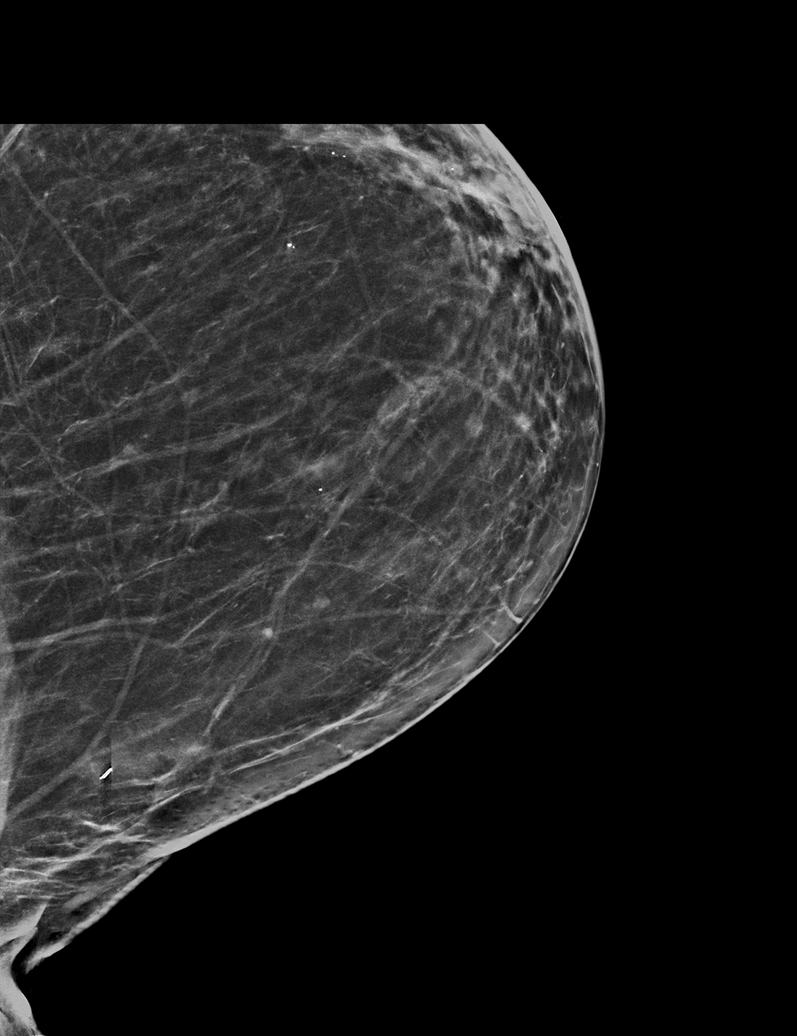

[L CC synth-2D (2 of 2)]
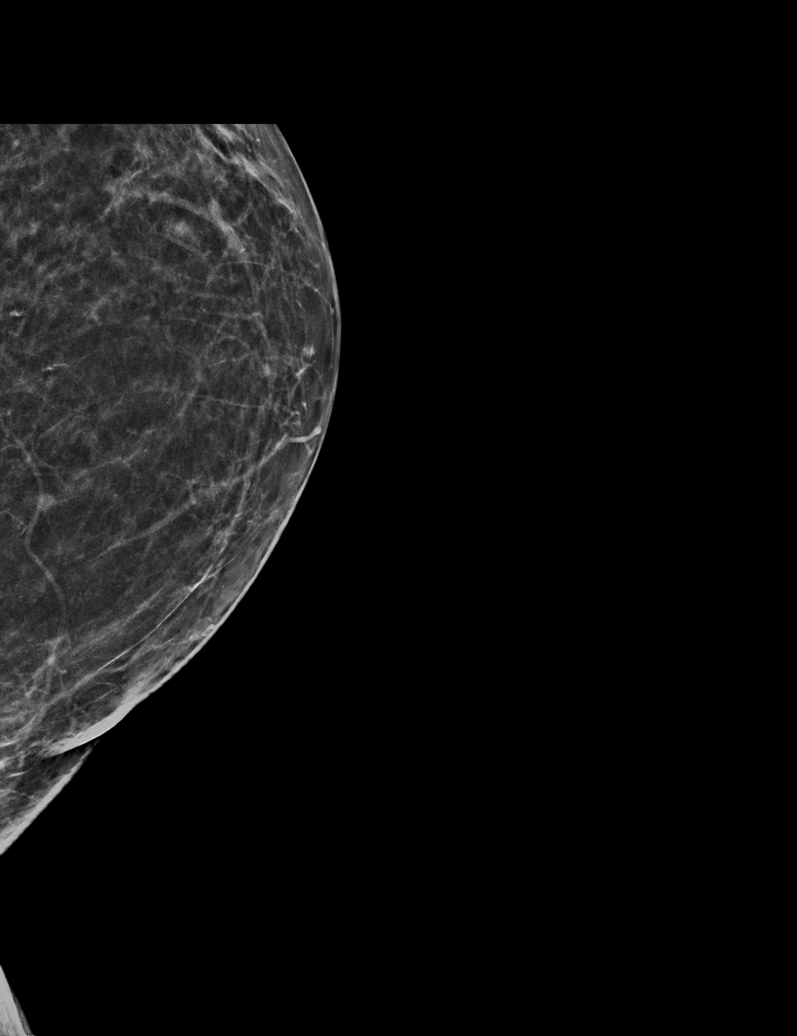

[L MLO tomo · tomo slice 40/79.0]
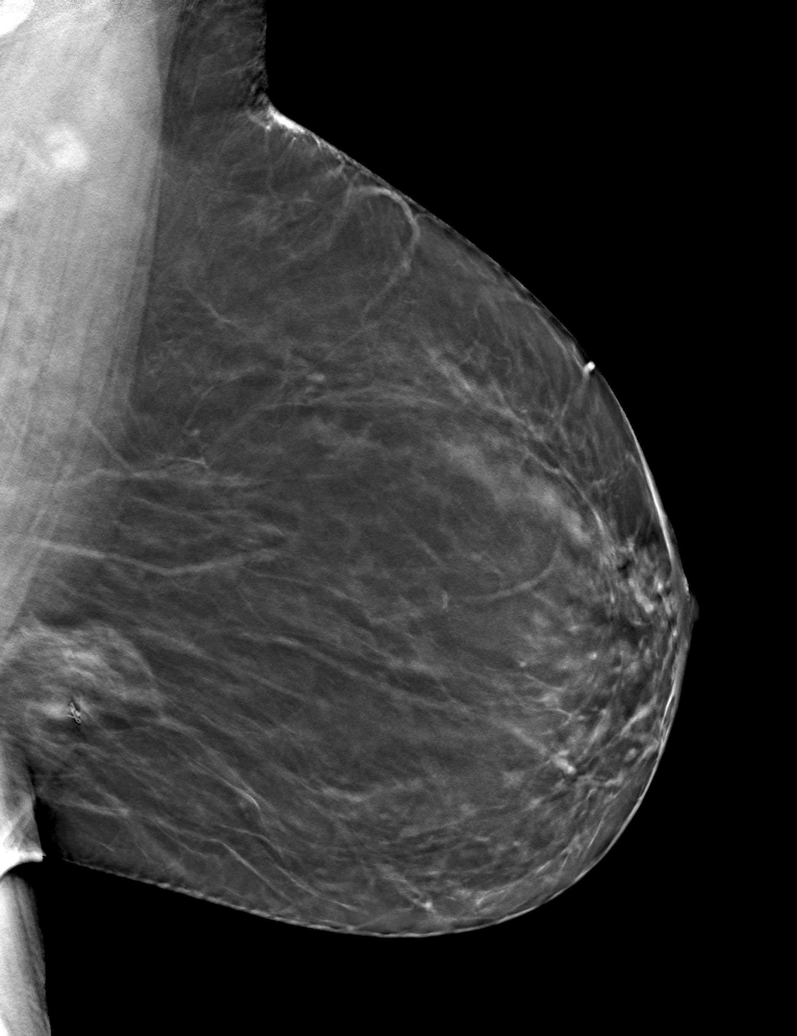

[L CC tomo (1 of 2) · tomo slice 33/66.0]
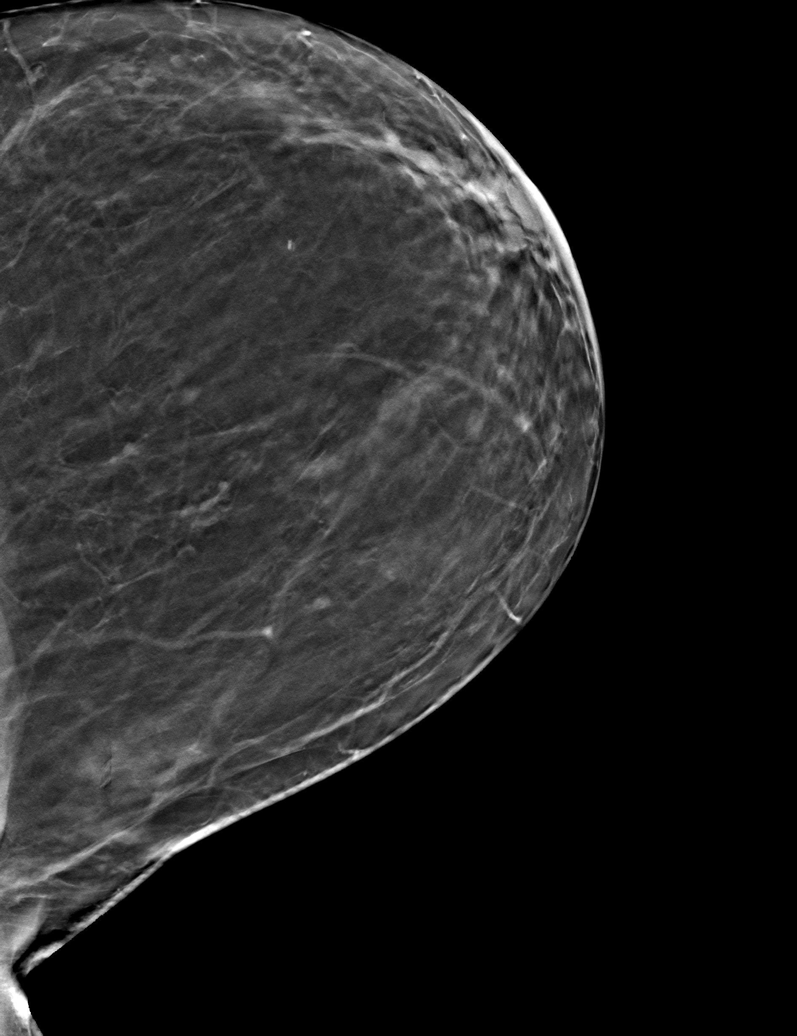

[L CC tomo (2 of 2) · tomo slice 27/52.0]
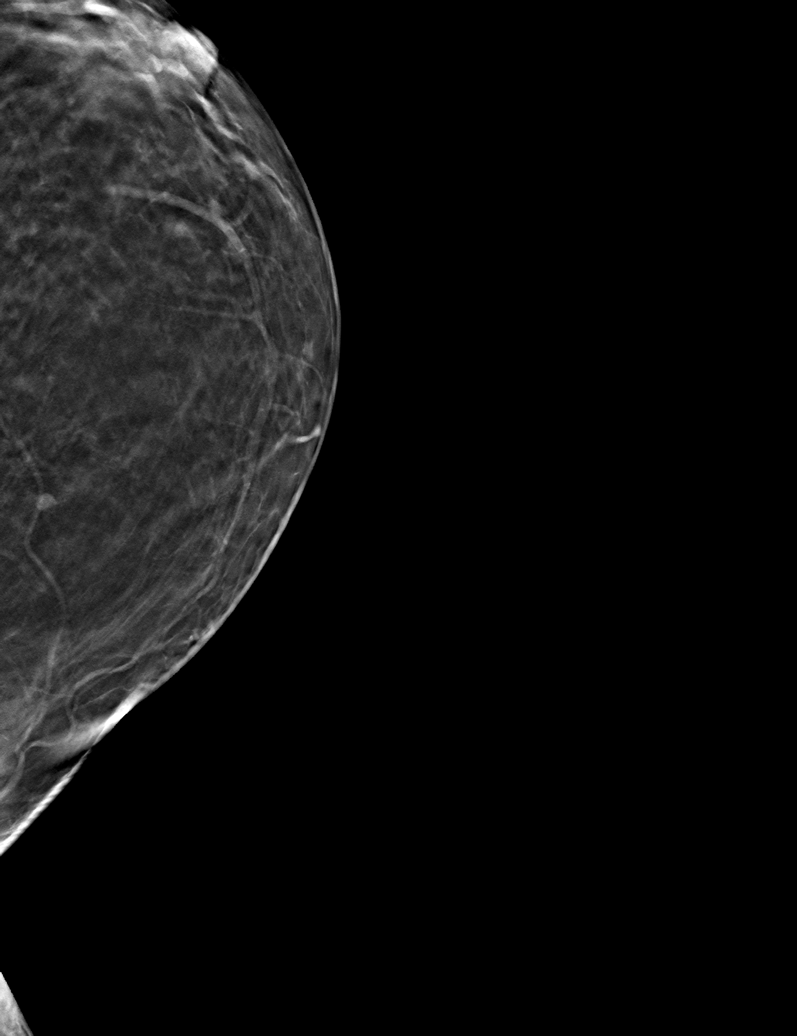

[6 of 18 positions shown; findings below may reference images not displayed]

FINDINGS: 3D Mammographic images were obtained following ultrasound guided
biopsy of a mass in the left breast at the 8 o'clock position. A
ribbon shaped biopsy marking clip is present at the site of the
biopsied mass in the left breast at the 8 o'clock position.
IMPRESSION: Ribbon shaped biopsy marking clip at site of biopsied mass in the
left breast at the 8 o'clock position.

Final Assessment: Post Procedure Mammograms for Marker Placement

## 2023-10-16 ENCOUNTER — Ambulatory Visit: Payer: Medicaid Other | Admitting: Physical Therapy

## 2023-10-17 NOTE — Therapy (Deleted)
 OUTPATIENT PHYSICAL THERAPY THORACOLUMBAR TREATMENT   Patient Name: Donna [name] MRN: [id number] DOB:08-Aug-1971, 53 y.o., female Today's Date: 10/17/2023  END OF SESSION:       Past Medical History:  Diagnosis Date   Allergy    Anemia    Anxiety    Arthritis    Contraceptive management 04/21/2014   Environmental allergies 12/30/2014   Herpes    History of anemia 05/05/2016   History of herpes simplex infection 04/21/2014   Irregular bleeding 05/05/2015   Itching in the vaginal area 05/05/2016   Retained tampon 05/05/2015   Umbilical hernia 12/30/2014   Vaginal discharge 05/05/2015   Past Surgical History:  Procedure Laterality Date   BREAST BIOPSY Left 12/2021   CERVICAL CERCLAGE  2010 and 2004   [employer] cerclage   CESAREAN SECTION  10/10/2010   [facility]   HERNIA REPAIR     umbilical   UMBILICAL HERNIA REPAIR  01/30/2012   Procedure: HERNIA REPAIR UMBILICAL ADULT;  Surgeon: Thresa JAYSON Pulling, MD;  Location: [facility];  Service: General;  Laterality: N/A;  With mesh   Patient Active Problem List   Diagnosis Date Noted   Right knee pain 04/24/2023   Left knee pain 04/24/2023   Back pain 03/10/2023   Acute vaginitis 04/21/2022   Encounter for annual general medical examination with abnormal findings in adult 01/21/2022   Atopic dermatitis in adult 01/21/2022   Seasonal allergies 01/21/2022   Caregiver stress 01/21/2022   Depression, major, single episode, mild (HCC) 01/21/2022   Trichomoniasis 10/15/2019   Vaginal odor 10/09/2019   Vaginal bleeding 10/09/2019   Screening examination for STD (sexually transmitted disease) 10/09/2019   Elevated BP without diagnosis of hypertension 10/09/2019   Iron deficiency anemia due to chronic blood loss 10/09/2019   Uterine fibroids 09/11/2017   Screening for colorectal cancer 06/27/2017   History of anemia 05/05/2016   Irregular bleeding 05/05/2015   Retained tampon 05/05/2015   Umbilical hernia 12/30/2014    Environmental allergies 12/30/2014   History of herpes simplex infection 04/21/2014    PCP: Gloria Zarwolo  REFERRING PROVIDER: Gloria Zarwolo  REFERRING DIAG: M54.50; G89.29- chronic low back pain, unspecified back pain laterality, unspecified whether sciatica present  Rationale for Evaluation and Treatment: Rehabilitation  THERAPY DIAG:  Other low back pain  Muscle weakness (generalized)  Abnormal posture  Cramp and spasm  Cervicalgia  Difficulty in walking, not elsewhere classified  Other abnormalities of gait and mobility  Left knee pain, unspecified chronicity  Other symptoms and signs involving the musculoskeletal system  ONSET DATE: 02/2023  SUBJECTIVE:  SUBJECTIVE STATEMENT:      PERTINENT HISTORY:  Anemia; Depression 11/15:Pt states she has been told she may need a left TKR  PAIN: 10/09/2023  Are you having pain? Yes: NPRS scale: 10/10 Pain location: back, neck, right LE, left knee Pain description: sharp and achy Aggravating factors: cervical rotation, bending, stairs, getting out of bed Relieving factors: Muscle relaxer  PRECAUTIONS: None  RED FLAGS: None   WEIGHT BEARING RESTRICTIONS: No  FALLS:  Has patient fallen in last 6 months? No  LIVING ENVIRONMENT: Lives with: lives with their family Lives in: House/apartment Stairs: 8 steps inside the house   OCCUPATION: Out of work currently  PLOF: Independent  PATIENT GOALS: To bet better and get myself back going  NEXT MD VISIT: PRN  OBJECTIVE:  Note: Objective measures were completed at Evaluation unless otherwise noted.   PATIENT SURVEYS:  Modified Oswestry 12/50  11/15:  11/50 22%   SCREENING FOR RED FLAGS: Bowel or bladder incontinence: No Spinal tumors: No Cauda equina syndrome:  No Compression fracture: No Abdominal aneurysm: No  COGNITION: Overall cognitive status: Within functional limits for tasks assessed      POSTURE: rounded shoulders, forward head, and increased lumbar lordosis  PALPATION: Decreased joint mobility and increased tenderness throughout lumbar spine Grade I PA Increased muscle spasms on bilateral lumbar erector spinae and bilateral piriformis  LUMBAR ROM:   AROM eval 11/15  Flexion WFL pain going down 70  Extension 25% limited 30 little pain  Right lateral flexion Bend to knee joint line 15 pain  Left lateral flexion Bend to knee joint line 35  Right rotation [facility] but pain   Left rotation WFL but pain    (Blank rows = not tested) 11/15: TRUNK STRENGTH:  Decreased activation of transverse abdominus muscles; abdominals 4-/5; decreased activation of lumbar multifidi; trunk extensors 4-/5   LOWER EXTREMITY ROM:   PROM WFL; pain with Lt knee flexion   LOWER EXTREMITY MMT:    MMT Right eval Left eval 11/15  Hip flexion 4- 4- 4- B  Hip extension     Hip abduction 4 4 4- B  Hip adduction 4 4   Hip internal rotation     Hip external rotation     Knee flexion 4- limited by pain 3+ limited by pain   Knee extension 4- limited by pain 3+ limited by pain   Ankle dorsiflexion     Ankle plantarflexion     Ankle inversion     Ankle eversion      (Blank rows = not tested)  11/15: SLS 3-5 sec right/left with pelvic drop  GAIT: Comments: decreased cadence, antalgic gait  TODAY'S TREATMENT:                                                                                                                              DATE:   10/18/23: Pt arrives for aquatic physical therapy. Treatment took place in 3.5-5.5 feet of  water. Water temperature was. Pt entered the pool via . Pt requires buoyancy of water for support and to offload joints with strengthening exercises.  Seated water bench with 75% submersion Pt performed seated LE AROM exercises 20x  in all planes,   10/09/23: Discussion of pain levels & how patient has been doing; benefit of getting a knee replacement; aquatic therapy benefit Seated knee flexion with slider 2 x 10 bilateral  Supine LTR x 10 each direction 3 way SB stretch x 8 each direction Supine Quad set (towel under knee) x 20 bilateral  Cold Pack bilateral knees x 10 mins at end of session   08/25/23: Nustep x 5 min level 3  blue machine Modified Oswestry Lumbar ROM Strength assessment see above Sidelying clam 10x right/left (added to HEP) Open books right 10x (decreased pain in right QL muscle) added to HEP Seated right QL stretch 10x reach UE up and over added to HEP Standing doorway option for QL stretch added to HEP Discussed option of aquatic PT  PATIENT EDUCATION:  Education details: lumbar mobility; DN handout Person educated: Patient Education method: Explanation, Demonstration, and Handouts Education comprehension: verbalized understanding, returned demonstration, and needs further education  HOME EXERCISE PROGRAM: Access Code: [email or web address] URL: [email or web address] Date: 08/25/2023 Prepared by: Glade Pesa  Exercises - Supine Lower Trunk Rotation  - 2 x daily - 7 x weekly - 1 sets - 10 reps - Seated Hamstring Stretch  - 2 x daily - 7 x weekly - 1 sets - 30 hold - Standing 'L' Stretch at Counter  - 2 x daily - 7 x weekly - 1 sets - 5 reps - Prone Quadriceps Stretch with Strap  - 2 x daily - 7 x weekly - 1 sets - 10 reps - Standing Hamstring Stretch on Chair  - 1 x daily - 7 x weekly - 3 sets - 10 reps - Quadricep Stretch with Chair and Counter Support  - 1 x daily - 7 x weekly - 3 sets - 10 reps - Supine Posterior Pelvic Tilt  - 1 x daily - 7 x weekly - 3 sets - 10 reps - Supine 90/90 Alternating Heel Touches with Posterior Pelvic Tilt  - 1 x daily - 7 x weekly - 3 sets - 10 reps - Supine Piriformis Stretch with Foot on Ground  - 1 x daily - 7 x weekly - 3 sets - 10 reps -  Clamshell  - 1 x daily - 7 x weekly - 1 sets - 10 reps - Sidelying Open Book Thoracic Rotation with Knee on Foam Roll  - 1 x daily - 7 x weekly - 1 sets - 10 reps - Seated Quadratus Lumborum Stretch in Chair  - 1 x daily - 7 x weekly - 1 sets - 10 reps - Standing Quadratus Lumborum Stretch with Doorway (Mirrored)  - 1 x daily - 7 x weekly - 1 sets - 10 reps ASSESSMENT:  CLINICAL IMPRESSION:  OBJECTIVE IMPAIRMENTS: Abnormal gait, decreased mobility, decreased ROM, decreased strength, hypomobility, increased muscle spasms, impaired flexibility, postural dysfunction, and pain.   ACTIVITY LIMITATIONS: lifting, bending, sitting, squatting, sleeping, stairs, and bed mobility  PARTICIPATION LIMITATIONS: driving, community activity, and gardening  PERSONAL FACTORS: Age, Time since onset of injury/illness/exacerbation, and 1 comorbidity: depression  are also affecting patient's functional outcome.   REHAB POTENTIAL: Good  CLINICAL DECISION MAKING: Stable/uncomplicated  EVALUATION COMPLEXITY: Low   GOALS: Goals reviewed with patient? Yes  SHORT TERM GOALS: Target date: 08/10/2023  Patient will be independent with initial HEP. Baseline:  Goal status: MET 08/16/2023  2.  Patient will be able to sit for 15 minutes with < or = to 3/10 back and neck pain. Baseline: 5 minute tolerance 5/10 pain Goal status: MET 08/16/2023 Patient can sit for 20 minutes before experiencing pain   LONG TERM GOALS: Target date: 10/20/2023  Patient will demonstrate independence in advanced HEP. Baseline:  Goal status: In progress  2.  Patient will be able to complete bed mobility with < or = 2/10 back pain. Baseline:  Goal status: In progress   3.  Patient will be able to work in her garden with < or = to 4/10 back pain. Baseline: 8/10 pain Goal status: In progress  4.  Patient will be able to negotiate stairs with < 4/10 back pain. Baseline: 8/10 pain Goal status: In progress    PLAN:  PT  FREQUENCY: 2x/week  PT DURATION: 8 weeks  PLANNED INTERVENTIONS: Therapeutic exercises, Therapeutic activity, Neuromuscular re-education, Balance training, Gait training, Patient/Family education, Self Care, Joint mobilization, Joint manipulation, Stair training, Vestibular training, Canalith repositioning, Aquatic Therapy, Dry Needling, Electrical stimulation, Spinal manipulation, Spinal mobilization, Cryotherapy, Moist heat, Splintting, Taping, Vasopneumatic device, Traction, Ultrasound, Ionotophoresis 4mg /ml Dexamethasone , and Manual therapy.  PLAN FOR NEXT SESSION: ask about MD appointment about knees; possibly DN to quads & low back; assess pain tolerance. Lumbar & hip mobility   Delon Darner, PTA 10/17/23 8:54 PM   [facility] [address], [address] [place], [place] [address] Phone # [phone] Fax [phone]

## 2023-10-18 ENCOUNTER — Ambulatory Visit: Payer: Medicaid Other | Admitting: Physical Therapy

## 2023-10-18 DIAGNOSIS — R29898 Other symptoms and signs involving the musculoskeletal system: Secondary | ICD-10-CM

## 2023-10-18 DIAGNOSIS — M542 Cervicalgia: Secondary | ICD-10-CM

## 2023-10-18 DIAGNOSIS — R293 Abnormal posture: Secondary | ICD-10-CM

## 2023-10-18 DIAGNOSIS — R252 Cramp and spasm: Secondary | ICD-10-CM

## 2023-10-18 DIAGNOSIS — M25562 Pain in left knee: Secondary | ICD-10-CM

## 2023-10-18 DIAGNOSIS — R2689 Other abnormalities of gait and mobility: Secondary | ICD-10-CM

## 2023-10-18 DIAGNOSIS — R262 Difficulty in walking, not elsewhere classified: Secondary | ICD-10-CM

## 2023-10-18 DIAGNOSIS — M6281 Muscle weakness (generalized): Secondary | ICD-10-CM

## 2023-10-18 DIAGNOSIS — M5459 Other low back pain: Secondary | ICD-10-CM

## 2023-10-19 ENCOUNTER — Ambulatory Visit: Payer: Medicaid Other | Admitting: Physical Therapy

## 2023-10-20 ENCOUNTER — Ambulatory Visit: Payer: Medicaid Other | Admitting: Physical Therapy

## 2023-10-25 ENCOUNTER — Telehealth: Payer: Self-pay | Admitting: Physical Therapy

## 2023-10-25 NOTE — Telephone Encounter (Signed)
 Spoke with patient to see if she wants to continue therapy since she has cancelled numerous appointments. She verbalized wanting to continue and scheduled patient for an appointment 1/21 at 4:15pm for a re-evaluation.   Penelope Bowie, PT 10/25/23 12:19 PM

## 2023-10-27 ENCOUNTER — Ambulatory Visit: Payer: Medicaid Other | Admitting: Family

## 2023-10-27 VITALS — BP 148/98 | HR 79 | Temp 98.4°F | Ht 61.0 in | Wt 169.0 lb

## 2023-10-27 DIAGNOSIS — F32A Depression, unspecified: Secondary | ICD-10-CM | POA: Diagnosis not present

## 2023-10-27 DIAGNOSIS — Z0130 Encounter for examination of blood pressure without abnormal findings: Secondary | ICD-10-CM

## 2023-10-27 DIAGNOSIS — Z7689 Persons encountering health services in other specified circumstances: Secondary | ICD-10-CM | POA: Diagnosis not present

## 2023-10-27 DIAGNOSIS — F419 Anxiety disorder, unspecified: Secondary | ICD-10-CM

## 2023-10-27 DIAGNOSIS — N951 Menopausal and female climacteric states: Secondary | ICD-10-CM | POA: Diagnosis not present

## 2023-10-27 MED ORDER — PAROXETINE MESYLATE 7.5 MG PO CAPS
7.5000 mg | ORAL_CAPSULE | Freq: Every day | ORAL | 1 refills | Status: DC
Start: 2023-10-27 — End: 2024-08-23

## 2023-10-27 NOTE — Progress Notes (Signed)
Patient states she had another Dr through [facility], and she moved out here and wants to get everything transferred to here

## 2023-10-27 NOTE — Progress Notes (Signed)
Subjective:    Donna [name] - 53 y.o. female MRN [id number]  Date of birth: 09/27/1971  HPI  Donna [name] is to establish care.   Current issues and/or concerns: - Hot flashes. No periods for 1 year. No history of partial/total hysterectomy.  - Anxiety depression related to recent loss of father. She denies thoughts of self-harm, suicidal ideations, homicidal ideations. - Blood pressure check. She is not prescribed medications. States she eats out a lot. She does not exercise. She does not complain of red flag symptoms such as but not limited to chest pain, shortness of breath, worst headache of life, nausea/vomiting.  - Established with Orthopedics.  - Established with Physical Therapy.  - No further issues/concerns for discussion today.  ROS per HPI     Health Maintenance:  Health Maintenance Due  Topic Date Due   COVID-19 Vaccine (1) Never done   Colonoscopy  Never done     Past Medical History: Patient Active Problem List   Diagnosis Date Noted   Right knee pain 04/24/2023   Left knee pain 04/24/2023   Back pain 03/10/2023   Acute vaginitis 04/21/2022   Encounter for annual general medical examination with abnormal findings in adult 01/21/2022   Atopic dermatitis in adult 01/21/2022   Seasonal allergies 01/21/2022   Caregiver stress 01/21/2022   Depression, major, single episode, mild (HCC) 01/21/2022   Trichomoniasis 10/15/2019   Vaginal odor 10/09/2019   Vaginal bleeding 10/09/2019   Screening examination for STD (sexually transmitted disease) 10/09/2019   Elevated BP without diagnosis of hypertension 10/09/2019   Iron deficiency anemia due to chronic blood loss 10/09/2019   Uterine fibroids 09/11/2017   Screening for colorectal cancer 06/27/2017   History of anemia 05/05/2016   Irregular bleeding 05/05/2015   Retained tampon 05/05/2015   Umbilical hernia 12/30/2014   Environmental allergies 12/30/2014   History of herpes simplex infection 04/21/2014       Social History   reports that she has never smoked. She has never used smokeless tobacco. She reports current alcohol use. She reports that she does not use drugs.   Family History  family history includes Asthma in her daughter and daughter; Cancer in her maternal grandfather; Diabetes in her father and mother; Hypertension in her father and mother; Multiple sclerosis in her father.   Medications: reviewed and updated   Objective:   Physical Exam BP (!) 148/98   Pulse 79   Temp 98.4 F (36.9 C) (Oral)   Ht 5\' 1"  (1.549 m)   Wt 169 lb (76.7 kg)   LMP 11/18/2021 (Approximate)   SpO2 95%   BMI 31.93 kg/m   Physical Exam HENT:     Head: Normocephalic and atraumatic.     Nose: Nose normal.     Mouth/Throat:     Mouth: Mucous membranes are moist.     Pharynx: Oropharynx is clear.  Eyes:     Extraocular Movements: Extraocular movements intact.     Conjunctiva/sclera: Conjunctivae normal.     Pupils: Pupils are equal, round, and reactive to light.  Cardiovascular:     Rate and Rhythm: Normal rate and regular rhythm.     Pulses: Normal pulses.     Heart sounds: Normal heart sounds.  Pulmonary:     Effort: Pulmonary effort is normal.     Breath sounds: Normal breath sounds.  Musculoskeletal:        General: Normal range of motion.     Cervical back: Normal range of  motion and neck supple.  Neurological:     General: No focal deficit present.     Mental Status: She is alert and oriented to person, place, and time.  Psychiatric:        Mood and Affect: Mood normal.        Behavior: Behavior normal.       Assessment & Plan:  1. Encounter to establish care (Primary) - Patient presents today to establish care. During the interim follow-up with primary provider as scheduled.  - Return for annual physical examination, labs, and health maintenance. Arrive fasting meaning having no food for at least 8 hours prior to appointment. You may have only water or black coffee.  Please take scheduled medications as normal.  2. Vasomotor symptoms due to menopause - Paroxetine Mesylate as prescribed. Counseled on medication adherence/adverse effects. - Referral to Gynecology for evaluation/management. - Follow-up with primary provider as scheduled. - PARoxetine Mesylate 7.5 MG CAPS; Take 7.5 mg by mouth at bedtime.  Dispense: 30 capsule; Refill: 1 - Ambulatory referral to Gynecology  3. Anxiety and depression - Patient denies thoughts of self-harm, suicidal ideations, homicidal ideations. - Patient declined pharmacological therapy.  - Patient declined referral to Psychiatry. - Follow-up with primary provider as scheduled.  4. Blood pressure check - Blood pressure not at goal during today's visit. Patient asymptomatic without chest pressure, chest pain, palpitations, shortness of breath, worst headache of life, and any additional red flag symptoms. - Follow-up with primary provider in 4 weeks or sooner if needed.     Patient was given clear instructions to go to Emergency Department or return to medical center if symptoms don't improve, worsen, or new problems develop.The patient verbalized understanding.  I discussed the assessment and treatment plan with the patient. The patient was provided an opportunity to ask questions and all were answered. The patient agreed with the plan and demonstrated an understanding of the instructions.   The patient was advised to call back or seek an in-person evaluation if the symptoms worsen or if the condition fails to improve as anticipated.    Ricky Stabs, NP 10/27/2023, 1:34 PM Primary Care at [facility]

## 2023-10-31 ENCOUNTER — Encounter: Payer: Self-pay | Admitting: Physical Therapy

## 2023-10-31 ENCOUNTER — Encounter: Payer: Self-pay | Admitting: Family Medicine

## 2023-10-31 ENCOUNTER — Ambulatory Visit: Payer: Medicaid Other | Attending: Family Medicine | Admitting: Physical Therapy

## 2023-10-31 DIAGNOSIS — R293 Abnormal posture: Secondary | ICD-10-CM | POA: Insufficient documentation

## 2023-10-31 DIAGNOSIS — R252 Cramp and spasm: Secondary | ICD-10-CM | POA: Insufficient documentation

## 2023-10-31 DIAGNOSIS — M5459 Other low back pain: Secondary | ICD-10-CM | POA: Insufficient documentation

## 2023-10-31 DIAGNOSIS — M542 Cervicalgia: Secondary | ICD-10-CM | POA: Insufficient documentation

## 2023-10-31 DIAGNOSIS — M6281 Muscle weakness (generalized): Secondary | ICD-10-CM | POA: Insufficient documentation

## 2023-11-05 ENCOUNTER — Other Ambulatory Visit: Payer: Self-pay | Admitting: Family Medicine

## 2023-11-05 DIAGNOSIS — G8929 Other chronic pain: Secondary | ICD-10-CM

## 2023-11-05 DIAGNOSIS — R293 Abnormal posture: Secondary | ICD-10-CM

## 2023-11-05 DIAGNOSIS — M6281 Muscle weakness (generalized): Secondary | ICD-10-CM

## 2023-11-13 ENCOUNTER — Ambulatory Visit: Payer: Medicaid Other | Admitting: Family Medicine

## 2023-11-15 ENCOUNTER — Other Ambulatory Visit: Payer: Self-pay

## 2023-11-15 ENCOUNTER — Ambulatory Visit: Payer: Medicaid Other | Attending: Family Medicine

## 2023-11-15 ENCOUNTER — Ambulatory Visit: Payer: Medicaid Other

## 2023-11-15 DIAGNOSIS — M545 Low back pain, unspecified: Secondary | ICD-10-CM | POA: Diagnosis not present

## 2023-11-15 DIAGNOSIS — G8929 Other chronic pain: Secondary | ICD-10-CM | POA: Insufficient documentation

## 2023-11-15 DIAGNOSIS — M25561 Pain in right knee: Secondary | ICD-10-CM | POA: Insufficient documentation

## 2023-11-15 DIAGNOSIS — R252 Cramp and spasm: Secondary | ICD-10-CM | POA: Diagnosis not present

## 2023-11-15 DIAGNOSIS — M25562 Pain in left knee: Secondary | ICD-10-CM | POA: Diagnosis not present

## 2023-11-15 DIAGNOSIS — M5459 Other low back pain: Secondary | ICD-10-CM | POA: Insufficient documentation

## 2023-11-15 DIAGNOSIS — R262 Difficulty in walking, not elsewhere classified: Secondary | ICD-10-CM | POA: Insufficient documentation

## 2023-11-15 DIAGNOSIS — R293 Abnormal posture: Secondary | ICD-10-CM | POA: Diagnosis not present

## 2023-11-15 DIAGNOSIS — M6281 Muscle weakness (generalized): Secondary | ICD-10-CM | POA: Diagnosis not present
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
## 2023-11-21 ENCOUNTER — Ambulatory Visit: Payer: Medicaid Other | Admitting: Physical Therapy
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
## 2023-11-28 ENCOUNTER — Encounter: Payer: Self-pay | Admitting: Physical Therapy

## 2023-11-28 ENCOUNTER — Ambulatory Visit: Payer: Medicaid Other | Admitting: Family

## 2023-11-28 ENCOUNTER — Ambulatory Visit: Payer: Medicaid Other | Admitting: Physical Therapy

## 2023-11-28 VITALS — BP 125/87 | HR 82 | Resp 12 | Ht 61.0 in | Wt 168.3 lb

## 2023-11-28 DIAGNOSIS — M6281 Muscle weakness (generalized): Secondary | ICD-10-CM

## 2023-11-28 DIAGNOSIS — G8929 Other chronic pain: Secondary | ICD-10-CM | POA: Diagnosis not present

## 2023-11-28 DIAGNOSIS — R262 Difficulty in walking, not elsewhere classified: Secondary | ICD-10-CM

## 2023-11-28 DIAGNOSIS — M5459 Other low back pain: Secondary | ICD-10-CM | POA: Diagnosis not present

## 2023-11-28 DIAGNOSIS — M542 Cervicalgia: Secondary | ICD-10-CM

## 2023-11-28 DIAGNOSIS — R293 Abnormal posture: Secondary | ICD-10-CM | POA: Diagnosis not present

## 2023-11-28 DIAGNOSIS — M25562 Pain in left knee: Secondary | ICD-10-CM | POA: Diagnosis not present

## 2023-11-28 DIAGNOSIS — N951 Menopausal and female climacteric states: Secondary | ICD-10-CM | POA: Diagnosis not present

## 2023-11-28 DIAGNOSIS — M545 Low back pain, unspecified: Secondary | ICD-10-CM | POA: Diagnosis not present

## 2023-11-28 DIAGNOSIS — Z0130 Encounter for examination of blood pressure without abnormal findings: Secondary | ICD-10-CM | POA: Diagnosis not present

## 2023-11-28 DIAGNOSIS — R252 Cramp and spasm: Secondary | ICD-10-CM

## 2023-11-28 DIAGNOSIS — M25561 Pain in right knee: Secondary | ICD-10-CM

## 2023-11-28 MED ORDER — PAROXETINE HCL 10 MG PO TABS
10.0000 mg | ORAL_TABLET | Freq: Every day | ORAL | 1 refills | Status: DC
Start: 2023-11-28 — End: 2024-08-23

## 2023-11-28 NOTE — Patient Instructions (Signed)
 Aquatic Therapy: What to Expect!  Where:  [facility] [address] [place], [place] [address] [phone]           How to Prepare:  If you require assistance with dressing, with transportation (ie: wheel chair), or toileting, a caregiver must attend the entire session with you (unless your primary therapists feels this is not necessary).   If there is thunder during your appointment, you will be asked to leave pool area. You have the option to finish your session in the physical therapy area near the gym. Masks in the pool area are optional. Your face will remain dry during your session, so you are welcome to keep your mask on, if desired. You will be spaced at least 6 feet from other aquatic patients.  Please bring your own swim towel to dry off with.   There are Men's and Women's locker rooms with showers, as well as gender neutral bathrooms in the pool area.  Please arrive IN YOUR SUIT and a few minutes prior to your appointment - this helps to avoid delays in starting your session. Head to the pool and await your appointment on the bench on the pool deck. Please make sure to attend to any toileting needs prior to entering the pool. Once on the pool deck your therapist will ask you to sign the Patient  Consent and Assignment of Benefits form. Your therapist may take your blood pressure prior to, during and after your session if indicated. We usually try and create a home exercise program based on activities we do in the pool. Some patients do not want to or do not have the ability to participate in an aquatic home program - this is not a barrier in any way to you participating in aquatic therapy as part of your current therapy plan!  Appointments:  All sessions are 45 minutes  About the pool: Entering the pool: Your therapist will assist you if needed; there are two ways to enter the pool - stairs or a mechanical lift. Your therapist will determine  the most appropriate way for you. Water temperature is usually around 91-95.  There is a lap pool with a temperature around 84 There may be other therapists and patients in the pool at the same time.   Contact Info:            To cancel appointment, please call [facility] at [phone] If you are running late, please call [facility] at [phone]

## 2023-11-28 NOTE — Progress Notes (Signed)
 Patient ID: Donna [name], female    DOB: 07/29/1971  MRN: [id number]  CC: Follow-Up  Subjective: Donna [name] is a 53 y.o. female who presents for follow-up.   Her concerns today include:  - Blood pressure check. Reports she is watching what she eats. Reports exercise limited by chronic bilateral knee pain.  - Reports health insurance did not cover Paroxetine Mesylate. Reports she missed Gynecology's call to schedule an appointment and plans to return their call soon. - Established with Physical therapy for chronic bilateral knee pain and neck pain. Denies recent trauma/injury and red flag symptoms. Requests referral to Orthopedics.   Patient Active Problem List   Diagnosis Date Noted   Right knee pain 04/24/2023   Left knee pain 04/24/2023   Back pain 03/10/2023   Acute vaginitis 04/21/2022   Encounter for annual general medical examination with abnormal findings in adult 01/21/2022   Atopic dermatitis in adult 01/21/2022   Seasonal allergies 01/21/2022   Caregiver stress 01/21/2022   Depression, major, single episode, mild (HCC) 01/21/2022   Trichomoniasis 10/15/2019   Vaginal odor 10/09/2019   Vaginal bleeding 10/09/2019   Screening examination for STD (sexually transmitted disease) 10/09/2019   Elevated BP without diagnosis of hypertension 10/09/2019   Iron deficiency anemia due to chronic blood loss 10/09/2019   Uterine fibroids 09/11/2017   Screening for colorectal cancer 06/27/2017   History of anemia 05/05/2016   Irregular bleeding 05/05/2015   Retained tampon 05/05/2015   Umbilical hernia 12/30/2014   Environmental allergies 12/30/2014   History of herpes simplex infection 04/21/2014     Current Outpatient Medications on File Prior to Visit  Medication Sig Dispense Refill   cyclobenzaprine (FLEXERIL) 10 MG tablet Take 1 tablet (10 mg total) by mouth 2 (two) times daily as needed for muscle spasms. 20 tablet 0   fluticasone (FLONASE) 50 MCG/ACT nasal spray  Place 1 spray into both nostrils daily as needed for allergies.     ibuprofen (ADVIL) 600 MG tablet Take 1 tablet (600 mg total) by mouth every 6 (six) hours as needed. 30 tablet 0   lidocaine (LIDODERM) 5 % Place 1 patch onto the skin daily. Remove & Discard patch within 12 hours or as directed by MD 30 patch 0   PARoxetine Mesylate 7.5 MG CAPS Take 7.5 mg by mouth at bedtime. 30 capsule 1   Vitamin D, Ergocalciferol, (DRISDOL) 1.25 MG (50000 UNIT) CAPS capsule Take 1 capsule (50,000 Units total) by mouth every 7 (seven) days. 20 capsule 1   [DISCONTINUED] cetirizine (ZYRTEC) 10 MG tablet Take 1 tablet (10 mg total) by mouth daily as needed for allergies. 30 tablet prn   No current facility-administered medications on file prior to visit.    Allergies  Allergen Reactions   Sulfa Antibiotics Rash    Social History   Socioeconomic History   Marital status: Media planner    Spouse name: Not on file   Number of children: 3   Years of education: Not on file   Highest education level: Some college, no degree  Occupational History   Not on file  Tobacco Use   Smoking status: Never   Smokeless tobacco: Never  Vaping Use   Vaping status: Never Used  Substance and Sexual Activity   Alcohol use: Yes    Comment: special occasions   Drug use: Never   Sexual activity: Yes    Birth control/protection: Pill  Other Topics Concern   Not on file  Social History  Narrative   Lives with fiance, and her children. Not working currently due to taking care of her fiance who has multiple myeloma.    Social Drivers of Corporate investment banker Strain: Low Risk  (10/27/2023)   Overall Financial Resource Strain (CARDIA)    Difficulty of Paying Living Expenses: Not very hard  Food Insecurity: No Food Insecurity (10/27/2023)   Hunger Vital Sign    Worried About Running Out of Food in the Last Year: Never true    Ran Out of Food in the Last Year: Never true  Transportation Needs: Unmet  Transportation Needs (10/27/2023)   PRAPARE - Administrator, Civil Service (Medical): Yes    Lack of Transportation (Non-Medical): Yes  Physical Activity: Insufficiently Active (10/27/2023)   Exercise Vital Sign    Days of Exercise per Week: 2 days    Minutes of Exercise per Session: 10 min  Stress: Stress Concern Present (10/27/2023)   [employer] of Occupational Health - Occupational Stress Questionnaire    Feeling of Stress : To some extent  Social Connections: Moderately Integrated (10/27/2023)   Social Connection and Isolation Panel [NHANES]    Frequency of Communication with Friends and Family: More than three times a week    Frequency of Social Gatherings with Friends and Family: More than three times a week    Attends Religious Services: More than 4 times per year    Active Member of Clubs or Organizations: Yes    Attends Banker Meetings: 1 to 4 times per year    Marital Status: Never married  Intimate Partner Violence: Not At Risk (12/25/2020)   Humiliation, Afraid, Rape, and Kick questionnaire    Fear of Current or Ex-Partner: No    Emotionally Abused: No    Physically Abused: No    Sexually Abused: No    Family History  Problem Relation Age of Onset   Diabetes Mother    Hypertension Mother    Diabetes Father    Hypertension Father    Multiple sclerosis Father    Cancer Maternal Grandfather        prostate   Asthma Daughter    Asthma Daughter    Anesthesia problems Neg Hx    Hypotension Neg Hx    Malignant hyperthermia Neg Hx    Pseudochol deficiency Neg Hx    Colon cancer Neg Hx    Esophageal cancer Neg Hx    Stomach cancer Neg Hx    Rectal cancer Neg Hx     Past Surgical History:  Procedure Laterality Date   BREAST BIOPSY Left 12/2021   CERVICAL CERCLAGE  2010 and 2004   [employer] cerclage   CESAREAN SECTION  10/10/2010   [facility]   HERNIA REPAIR     umbilical   UMBILICAL HERNIA REPAIR  01/30/2012   Procedure: HERNIA  REPAIR UMBILICAL ADULT;  Surgeon: Fabio Bering, MD;  Location: [facility];  Service: General;  Laterality: N/A;  With mesh    ROS: Review of Systems Negative except as stated above  PHYSICAL EXAM: BP 125/87   Pulse 82   Resp 12   Ht 5\' 1"  (1.549 m)   Wt 168 lb 5.4 oz (76.4 kg)   LMP 11/18/2021 (Approximate)   SpO2 99%   BMI 31.81 kg/m   Physical Exam HENT:     Head: Normocephalic and atraumatic.     Nose: Nose normal.     Mouth/Throat:     Mouth: Mucous membranes are  moist.     Pharynx: Oropharynx is clear.  Eyes:     Extraocular Movements: Extraocular movements intact.     Conjunctiva/sclera: Conjunctivae normal.     Pupils: Pupils are equal, round, and reactive to light.  Cardiovascular:     Rate and Rhythm: Normal rate and regular rhythm.     Pulses: Normal pulses.     Heart sounds: Normal heart sounds.  Pulmonary:     Effort: Pulmonary effort is normal.     Breath sounds: Normal breath sounds.  Musculoskeletal:        General: Normal range of motion.     Cervical back: Normal range of motion and neck supple.  Neurological:     General: No focal deficit present.     Mental Status: She is alert and oriented to person, place, and time.  Psychiatric:        Mood and Affect: Mood normal.        Behavior: Behavior normal.    ASSESSMENT AND PLAN: 1. Blood pressure check (Primary) - Blood pressure normal.  - Follow-up with primary provider as scheduled.   2. Neck pain 3. Chronic pain of both knees - Keep all scheduled appointments with Physical Therapy.  - Referral to Orthopedic Surgery for evaluation/management. - Ambulatory referral to Orthopedic Surgery  4. 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 Counseled on medication adherence/adverse effects.  - Keep all scheduled appointments with Gynecology.  - Follow-up with primary provider as scheduled.  - PARoxetine (PAXIL) 10 MG tablet; Take 1 tablet (10 mg total) by mouth daily.   Dispense: 30 tablet; Refill: 1   Patient was given the opportunity to ask questions.  Patient verbalized understanding of the plan and was able to repeat key elements of the plan. Patient was given clear instructions to go to Emergency Department or return to medical center if symptoms don't improve, worsen, or new problems develop.The patient verbalized understanding.   Orders Placed This Encounter  Procedures   Ambulatory referral to Orthopedic Surgery     Requested Prescriptions   Signed Prescriptions Disp Refills   PARoxetine (PAXIL) 10 MG tablet 30 tablet 1    Sig: Take 1 tablet (10 mg total) by mouth daily.    Follow-up with primary provider as scheduled.  Rema Fendt, NP

## 2023-12-01 ENCOUNTER — Encounter (HOSPITAL_BASED_OUTPATIENT_CLINIC_OR_DEPARTMENT_OTHER): Payer: Self-pay | Admitting: Physical Therapy

## 2023-12-01 ENCOUNTER — Ambulatory Visit (HOSPITAL_BASED_OUTPATIENT_CLINIC_OR_DEPARTMENT_OTHER): Payer: Medicaid Other | Attending: Orthopedic Surgery | Admitting: Physical Therapy

## 2023-12-01 DIAGNOSIS — M6281 Muscle weakness (generalized): Secondary | ICD-10-CM | POA: Insufficient documentation

## 2023-12-01 DIAGNOSIS — M5459 Other low back pain: Secondary | ICD-10-CM | POA: Insufficient documentation

## 2023-12-01 DIAGNOSIS — R262 Difficulty in walking, not elsewhere classified: Secondary | ICD-10-CM | POA: Diagnosis not present

## 2023-12-01 NOTE — Therapy (Signed)
 OUTPATIENT PHYSICAL THERAPY THORACOLUMBAR TREATMENT   Patient Name: Donna [name] MRN: [id number] DOB:09/17/1971, 53 y.o., female Today's Date: 12/01/2023  END OF SESSION:  PT End of Session - 12/01/23 1144     Visit Number 3    Date for PT Re-Evaluation 01/10/24    Authorization Time Period 7 visits approved 2/5 to 01/13/24    Authorization - Visit Number 3    Authorization - Number of Visits 7    Progress Note Due on Visit 10    PT Start Time 1145    PT Stop Time 1225    PT Time Calculation (min) 40 min    Behavior During Therapy [facility] for tasks assessed/performed              Past Medical History:  Diagnosis Date   Allergy    Anemia    Anxiety    Arthritis    Contraceptive management 04/21/2014   Environmental allergies 12/30/2014   Herpes    History of anemia 05/05/2016   History of herpes simplex infection 04/21/2014   Irregular bleeding 05/05/2015   Itching in the vaginal area 05/05/2016   Retained tampon 05/05/2015   Umbilical hernia 12/30/2014   Vaginal discharge 05/05/2015   Past Surgical History:  Procedure Laterality Date   BREAST BIOPSY Left 12/2021   CERVICAL CERCLAGE  2010 and 2004   [employer] cerclage   CESAREAN SECTION  10/10/2010   Women[facility]   HERNIA REPAIR     umbilical   UMBILICAL HERNIA REPAIR  01/30/2012   Procedure: HERNIA REPAIR UMBILICAL ADULT;  Surgeon: Fabio Bering, MD;  Location: [facility];  Service: General;  Laterality: N/A;  With mesh   Patient Active Problem List   Diagnosis Date Noted   Right knee pain 04/24/2023   Left knee pain 04/24/2023   Back pain 03/10/2023   Acute vaginitis 04/21/2022   Encounter for annual general medical examination with abnormal findings in adult 01/21/2022   Atopic dermatitis in adult 01/21/2022   Seasonal allergies 01/21/2022   Caregiver stress 01/21/2022   Depression, major, single episode, mild (HCC) 01/21/2022   Trichomoniasis 10/15/2019   Vaginal odor 10/09/2019   Vaginal bleeding  10/09/2019   Screening examination for STD (sexually transmitted disease) 10/09/2019   Elevated BP without diagnosis of hypertension 10/09/2019   Iron deficiency anemia due to chronic blood loss 10/09/2019   Uterine fibroids 09/11/2017   Screening for colorectal cancer 06/27/2017   History of anemia 05/05/2016   Irregular bleeding 05/05/2015   Retained tampon 05/05/2015   Umbilical hernia 12/30/2014   Environmental allergies 12/30/2014   History of herpes simplex infection 04/21/2014    PCP: Gilmore Laroche, FNP   REFERRING PROVIDER: Gilmore Laroche, FNP   REFERRING DIAG: M54.50,G89.29 (ICD-10-CM) - Chronic low back pain, unspecified back pain laterality, unspecified whether sciatica present M62.81 (ICD-10-CM) - Muscle weakness (generalized) R29.3 (ICD-10-CM) - Abnormal posture  Rationale for Evaluation and Treatment: Rehabilitation  THERAPY DIAG:  Other low back pain  Muscle weakness (generalized)  Difficulty in walking, not elsewhere classified  ONSET DATE: 11/05/2023  SUBJECTIVE:  SUBJECTIVE STATEMENT: I don't know how to swim.    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  Just moved from [place] to [place].   PERTINENT HISTORY:  Very active prior to first accident  PAIN:  Are you having pain? Yes: NPRS scale: 9/10 low back, 9/10 knees, 9/10 L shoulder  Pain location: see above Pain description: burning, aching Aggravating factors: walking, standing, start up Relieving factors: biofreeze, meds, ice/heat  PRECAUTIONS: None  RED FLAGS: None   WEIGHT BEARING RESTRICTIONS: No  FALLS:  Has patient fallen in last 6 months? 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  Get stronger and get back to being myself.  NEXT MD VISIT: In April  OBJECTIVE:  Note: Objective measures were completed at Evaluation unless otherwise noted.  DIAGNOSTIC FINDINGS:  na  PATIENT SURVEYS:  Modified Oswestry 11/50= 22% disability  LEFS 57/80= 71% (29% disability)  COGNITION: Overall cognitive status: Within functional limits for tasks assessed     SENSATION: WFL  MUSCLE LENGTH: Hamstrings: Right approx 60 deg; Left approx 60 deg Thomas test: Right pos; Left pos  POSTURE:  Pronounced bilateral knee varus, neutral feet  PALPATION: Symmetry throughout shoulders, scapulae, pelvis, hips  LUMBAR ROM:   All WNL  LOWER EXTREMITY ROM:     WFL  LOWER EXTREMITY MMT:    Generally 4 to 4+/5  LUMBAR SPECIAL TESTS:  Slump test: Negative  FUNCTIONAL TESTS:  5 times sit to stand: complete next visit Timed up and go (TUG): complete next visit  GAIT: Distance walked: 50 feet Assistive device utilized: None Level of assistance: Complete Independence Comments: antalgic start up and gait  [facility] Adult PT Treatment:                                                DATE: 12/01/23 Pt seen for aquatic therapy today.  Treatment took place in water 3.5-4.75 ft in depth at the [facility] pool. Temp of water was 91.  Pt entered/exited the pool via stairs independently with bilat rail. - intro to aquatic therapy priniciples - UE On barbell at 75ft, 6" depth:  walking next to wall forward/ backward multiple times;  side stepping R/L 20ft x 4 - UE  On wall:  relaxed squat x 5  (pause in squat for back relief) ; L stretch x 10s x 2;  hip abdct 2 x 5; single leg clam x 5 each; relaxed squat  - plank with UE on bench with hip ext x 5 each LE - seated on lift chair: LAQ, cycling, hip abdct/ addct   Pt requires the buoyancy and hydrostatic pressure of water for support, and to offload joints by unweighting joint load by at least 50 % in navel deep water and by at least 75-80% in chest to neck deep water.  Viscosity of the water is needed for resistance of strengthening. Water current perturbations provides  challenge to standing balance requiring increased core activation.  11/28/23 Supine LTR x 10 each direction PPT x 10 Sequential march x 5 B Prone hip ext 1 x 10 B Prone opp arm/leg x 10 Plank x 5 max hold - last rep hurt knee Attempted at counter with shoulder taps but needs more practice 3 way SB stretch x 8 each direction Seated knee flexion with slider 2 x 10 bilateral  Seated SLR x 10 B Nustep L5 x 7 min Seated fig 4 x 30 sec ea Discussed next appointment at pool - directions and handout given   11/15/23      Initial eval completed, educated on her knee posture and the effect of this on her knee and lower leg pain, encouraged patient to resume previous HEP.                                                                                                                               PATIENT EDUCATION:  Education details: educated on her knee posture and the effect of this on her knee and lower leg pain, symmetry and kinetic chain effects Person educated: Patient Education method: Medical illustrator Education comprehension: verbalized understanding  HOME EXERCISE PROGRAM: Access Code: [email or web address] URL: [email or web address] Date: 11/28/2023 Prepared by: Raynelle Fanning  Exercises - Supine Lower Trunk Rotation  - 2 x daily - 7 x weekly - 1 sets - 10 reps - Seated Hamstring Stretch  - 2 x daily - 7 x weekly - 1  sets - 30 hold - Standing 'L' Stretch at Counter  - 2 x daily - 7 x weekly - 1 sets - 5 reps - Prone Quadriceps Stretch with Strap  - 2 x daily - 7 x weekly - 1 sets - 10 reps - Standing Hamstring Stretch on Chair  - 1 x daily - 7 x weekly - 3 sets - 10 reps - Quadricep Stretch with Chair and Counter Support  - 1 x daily - 7 x weekly - 3 sets - 10 reps - Supine Posterior Pelvic Tilt  - 1 x daily - 7 x weekly - 3 sets - 10 reps - Supine 90/90 Alternating Heel Touches with Posterior Pelvic Tilt  - 1 x daily - 7 x weekly - 3 sets - 10 reps - Supine Piriformis Stretch with Foot on Ground  - 1 x daily - 7 x weekly - 3 sets - 10 reps - Clamshell  - 1 x daily - 7 x weekly - 1 sets - 10 reps - Sidelying Open Book Thoracic Rotation with Knee on Foam Roll  - 1 x daily - 7 x weekly - 1 sets - 10 reps - Seated Quadratus Lumborum Stretch in Chair  - 1 x daily - 7 x weekly - 1 sets - 10 reps - Standing Quadratus Lumborum Stretch with Doorway (Mirrored)  - 1 x daily - 7 x weekly - 1 sets - 10 reps - Hooklying  Sequential Leg March and Lower  - 1 x daily - 7 x weekly - 2 sets - 10 reps - Prone Alternating Arm and Leg Lifts  - 1 x daily - 7 x weekly - 3 sets - 10 reps - 3-5 sec hold - Seated Piriformis Stretch with Trunk Bend  - 2 x daily - 7 x weekly - 1 sets - 3 reps - 30-60 sec  hold   Pool info provided  ASSESSMENT:  CLINICAL IMPRESSION: Pt demonstrates safety and independence in aquatic setting with therapist instructing from deck. Pt is observed moving in a very guarded manner; with time and cues, and UE support on floation, she is able to progress to walking across width of pool.    Pt is directed through various movement patterns and trials in both sitting and standing positions. She reported gradual reduction of overall pain by 2 points.   Goals are ongoing.  Jesselle is a good candidate for aquatic therapy and will benefit from the properties of water to progress towards land based goals.   OBJECTIVE  IMPAIRMENTS: decreased knowledge of condition, difficulty walking, decreased strength, increased fascial restrictions, increased muscle spasms, impaired flexibility, postural dysfunction, and pain.   ACTIVITY LIMITATIONS: carrying, lifting, bending, sitting, standing, squatting, sleeping, stairs, transfers, bed mobility, bathing, dressing, hygiene/grooming, and caring for others  PARTICIPATION LIMITATIONS: meal prep, cleaning, laundry, driving, shopping, community activity, occupation, and yard work  PERSONAL FACTORS: Fitness, Profession, and Time since onset of injury/illness/exacerbation are also affecting patient's functional outcome.   REHAB POTENTIAL: Fair time since onset and poor attendance last episode  CLINICAL DECISION MAKING: Evolving/moderate complexity  EVALUATION COMPLEXITY: Moderate   GOALS: Goals reviewed with patient? Yes  SHORT TERM GOALS: Target date: 12/13/2023   Pain report to be no greater than 4/10  Baseline: Goal status: INITIAL  2.  Patient will be independent with initial HEP  Baseline:  Goal status: INITIAL   LONG TERM GOALS: Target date: 01/10/2024   Patient to report pain no greater than 2/10  Baseline:  Goal status: INITIAL  2.  Patient to be independent with advanced HEP  Baseline:  Goal status: INITIAL  3.  Patient to be able to return to work Baseline:  Goal status: INITIAL  4.  Patient to be able to avoid surgery on knees Baseline:  Goal status: INITIAL  5.  LEFS and Oswestry score to improve by 5% Baseline:  Goal status: INITIAL  6.  Functional tests to improve by 2-3 seconds Baseline:  Goal status: INITIAL  PLAN:  PT FREQUENCY: 2x/week  PT DURATION: 8 weeks  PLANNED INTERVENTIONS: 97110-Therapeutic exercises, 97530- Therapeutic activity, [id number]- Neuromuscular re-education, 97535- Self Care, 11914- Manual therapy, [id number]- Gait training, [phone]- Aquatic Therapy, 97014- Electrical stimulation (unattended), 97016- Vasopneumatic  device, [id number]- Ultrasound, [id number]- Traction (mechanical), [id number]- Ionotophoresis 4mg /ml Dexamethasone, Patient/Family education, Balance training, Stair training, Taping, Dry Needling, Joint mobilization, Spinal mobilization, Cryotherapy, and Moist heat.  PLAN FOR NEXT SESSION: work on core, quad strength; DN next land visit.  Mayer Camel, PTA 12/01/23 5:21 PM [facility] [address] [place], [place], [address] Phone: [phone]   Fax:  [phone]

## 2023-12-06 ENCOUNTER — Ambulatory Visit (HOSPITAL_BASED_OUTPATIENT_CLINIC_OR_DEPARTMENT_OTHER): Payer: Medicaid Other | Admitting: Physical Therapy

## 2023-12-06 ENCOUNTER — Encounter (HOSPITAL_BASED_OUTPATIENT_CLINIC_OR_DEPARTMENT_OTHER): Payer: Self-pay

## 2023-12-10 NOTE — Therapy (Incomplete)
 OUTPATIENT PHYSICAL THERAPY THORACOLUMBAR TREATMENT   Patient Name: Donna [name] MRN: [id number] DOB:15-Dec-1970, 53 y.o., female Today's Date: 12/10/2023  END OF SESSION:     Past Medical History:  Diagnosis Date   Allergy    Anemia    Anxiety    Arthritis    Contraceptive management 04/21/2014   Environmental allergies 12/30/2014   Herpes    History of anemia 05/05/2016   History of herpes simplex infection 04/21/2014   Irregular bleeding 05/05/2015   Itching in the vaginal area 05/05/2016   Retained tampon 05/05/2015   Umbilical hernia 12/30/2014   Vaginal discharge 05/05/2015   Past Surgical History:  Procedure Laterality Date   BREAST BIOPSY Left 12/2021   CERVICAL CERCLAGE  2010 and 2004   [employer] cerclage   CESAREAN SECTION  10/10/2010   [facility]   HERNIA REPAIR     umbilical   UMBILICAL HERNIA REPAIR  01/30/2012   Procedure: HERNIA REPAIR UMBILICAL ADULT;  Surgeon: Fabio Bering, MD;  Location: [facility];  Service: General;  Laterality: N/A;  With mesh   Patient Active Problem List   Diagnosis Date Noted   Right knee pain 04/24/2023   Left knee pain 04/24/2023   Back pain 03/10/2023   Acute vaginitis 04/21/2022   Encounter for annual general medical examination with abnormal findings in adult 01/21/2022   Atopic dermatitis in adult 01/21/2022   Seasonal allergies 01/21/2022   Caregiver stress 01/21/2022   Depression, major, single episode, mild (HCC) 01/21/2022   Trichomoniasis 10/15/2019   Vaginal odor 10/09/2019   Vaginal bleeding 10/09/2019   Screening examination for STD (sexually transmitted disease) 10/09/2019   Elevated BP without diagnosis of hypertension 10/09/2019   Iron deficiency anemia due to chronic blood loss 10/09/2019   Uterine fibroids 09/11/2017   Screening for colorectal cancer 06/27/2017   History of anemia 05/05/2016   Irregular bleeding 05/05/2015   Retained tampon 05/05/2015   Umbilical hernia 12/30/2014   Environmental  allergies 12/30/2014   History of herpes simplex infection 04/21/2014    PCP: Gilmore Laroche, FNP   REFERRING PROVIDER: Gilmore Laroche, FNP   REFERRING DIAG: M54.50,G89.29 (ICD-10-CM) - Chronic low back pain, unspecified back pain laterality, unspecified whether sciatica present M62.81 (ICD-10-CM) - Muscle weakness (generalized) R29.3 (ICD-10-CM) - Abnormal posture  Rationale for Evaluation and Treatment: Rehabilitation  THERAPY DIAG:  No diagnosis found.  ONSET DATE: 11/05/2023  SUBJECTIVE:                                                                                                                                                                                           SUBJECTIVE  STATEMENT: ***    Pt reports she is a member of [employer].  Just moved from [place] to [place].   PERTINENT HISTORY:  Very active prior to first accident  PAIN:  Are you having pain? Yes: NPRS scale: 9/10 low back, 9/10 knees, 9/10 L shoulder  Pain location: see above Pain description: burning, aching Aggravating factors: walking, standing, start up Relieving factors: biofreeze, meds, ice/heat  PRECAUTIONS: None  RED FLAGS: None   WEIGHT BEARING RESTRICTIONS: No  FALLS:  Has patient fallen in last 6 months? 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  Get stronger and get back to being myself.  NEXT MD VISIT: In April  OBJECTIVE:  Note: Objective  measures were completed at Evaluation unless otherwise noted.  DIAGNOSTIC FINDINGS:  na  PATIENT SURVEYS:  Modified Oswestry 11/50= 22% disability  LEFS 57/80= 71% (29% disability)  COGNITION: Overall cognitive status: Within functional limits for tasks assessed     SENSATION: WFL  MUSCLE LENGTH: Hamstrings: Right approx 60 deg; Left approx 60 deg Thomas test: Right pos; Left pos  POSTURE:  Pronounced bilateral knee varus, neutral feet  PALPATION: Symmetry throughout shoulders, scapulae, pelvis, hips  LUMBAR ROM:   All WNL  LOWER EXTREMITY ROM:     WFL  LOWER EXTREMITY MMT:    Generally 4 to 4+/5  LUMBAR SPECIAL TESTS:  Slump test: Negative  FUNCTIONAL TESTS:  5 times sit to stand: complete next visit Timed up and go (TUG): complete next visit  GAIT: Distance walked: 50 feet Assistive device utilized: None Level of assistance: Complete Independence Comments: antalgic start up and gait  [facility] Adult PT Treatment:                                                DATE:  12/10/23 Nustep L5 x 7 min Supine LTR x 10 each direction PPT x 10 Sequential march x 5 B Prone hip ext 1 x 10 B Prone opp arm/leg x 10 Plank x 5 max hold - last rep hurt knee Attempted at counter with shoulder taps but needs more practice 3 way SB stretch x 8 each direction Seated knee flexion with slider 2 x 10 bilateral  Seated SLR x 10 B Seated fig 4 x 30 sec ea  Trigger Point Dry Needling  {TPDN session education:31674}  Patient Verbal Consent Given: {yes/no:20286} Education Handout Provided: {yes/no/previously provided:31649} Muscles Treated: *** Electrical Stimulation Performed: {TPDN Estim yes/no paramaters:31650} Treatment Response/Outcome: ***    12/01/23 Pt seen for aquatic therapy today.  Treatment took place in water 3.5-4.75 ft in depth at the [facility] pool. Temp of water was 91.  Pt entered/exited the pool via stairs independently with bilat rail. - intro  to aquatic therapy priniciples - UE On barbell at 50ft, 6" depth:  walking next to wall forward/ backward multiple times;  side stepping R/L 33ft x 4 - UE On wall:  relaxed squat x 5  (pause in squat for back relief) ; L stretch x 10s x 2;  hip abdct 2 x 5; single leg clam x 5 each; relaxed squat  - plank with UE on bench with hip ext x 5 each LE - seated on lift chair: LAQ, cycling, hip abdct/ addct   Pt requires the buoyancy and hydrostatic pressure of water for support, and to offload joints by unweighting joint load by at least 50 % in navel deep water and by at least 75-80% in chest to neck deep water.  Viscosity of the water is needed for resistance of strengthening. Water current perturbations provides challenge to standing balance requiring increased core activation.  11/28/23 Supine LTR x 10 each direction PPT x 10 Sequential march x 5 B Prone hip ext 1 x 10 B Prone opp arm/leg x 10 Plank x 5 max hold - last rep hurt knee Attempted at counter with shoulder taps but needs more practice 3 way SB stretch x 8 each direction Seated knee flexion with slider 2 x 10 bilateral  Seated SLR x 10 B Nustep L5 x 7 min Seated fig 4 x 30 sec ea Discussed next appointment at pool - directions and handout given   11/15/23      Initial eval completed, educated on her knee posture and the effect of this on her knee and lower leg pain, encouraged patient to resume previous HEP.                                                                                                                               PATIENT EDUCATION:  Education details: educated on her knee posture and the effect of this on her knee and lower leg pain, symmetry and kinetic chain effects Person educated: Patient Education method: Medical illustrator Education comprehension: verbalized understanding  HOME EXERCISE PROGRAM: Access Code: [email or web address] URL: [email or web address] Date: 11/28/2023 Prepared by:  Raynelle Fanning  Exercises - Supine Lower Trunk Rotation  - 2 x daily - 7 x weekly - 1 sets - 10 reps - Seated Hamstring Stretch  - 2 x daily - 7 x weekly - 1 sets - 30 hold - Standing 'L' Stretch at Counter  - 2 x daily - 7 x weekly - 1 sets - 5 reps - Prone Quadriceps Stretch with Strap  - 2 x daily - 7 x weekly - 1 sets - 10 reps - Standing Hamstring Stretch on Chair  - 1 x daily - 7 x weekly - 3 sets - 10 reps - Quadricep Stretch with Chair and Counter Support  - 1 x daily - 7 x weekly - 3 sets - 10 reps - Supine Posterior Pelvic Tilt  - 1 x daily - 7 x weekly - 3 sets - 10 reps - Supine 90/90 Alternating Heel Touches with Posterior Pelvic Tilt  - 1 x daily - 7 x weekly - 3 sets - 10 reps -  Supine Piriformis Stretch with Foot on Ground  - 1 x daily - 7 x weekly - 3 sets - 10 reps - Clamshell  - 1 x daily - 7 x weekly - 1 sets - 10 reps - Sidelying Open Book Thoracic Rotation with Knee on Foam Roll  - 1 x daily - 7 x weekly - 1 sets - 10 reps - Seated Quadratus Lumborum Stretch in Chair  - 1 x daily - 7 x weekly - 1 sets - 10 reps - Standing Quadratus Lumborum Stretch with Doorway (Mirrored)  - 1 x daily - 7 x weekly - 1 sets - 10 reps - Hooklying Sequential Leg March and Lower  - 1 x daily - 7 x weekly - 2 sets - 10 reps - Prone Alternating Arm and Leg Lifts  - 1 x daily - 7 x weekly - 3 sets - 10 reps - 3-5 sec hold - Seated Piriformis Stretch with Trunk Bend  - 2 x daily - 7 x weekly - 1 sets - 3 reps - 30-60 sec  hold   Pool info provided  ASSESSMENT:  CLINICAL IMPRESSION: ***   OBJECTIVE IMPAIRMENTS: decreased knowledge of condition, difficulty walking, decreased strength, increased fascial restrictions, increased muscle spasms, impaired flexibility, postural dysfunction, and pain.   ACTIVITY LIMITATIONS: carrying, lifting, bending, sitting, standing, squatting, sleeping, stairs, transfers, bed mobility, bathing, dressing, hygiene/grooming, and caring for others  PARTICIPATION  LIMITATIONS: meal prep, cleaning, laundry, driving, shopping, community activity, occupation, and yard work  PERSONAL FACTORS: Fitness, Profession, and Time since onset of injury/illness/exacerbation are also affecting patient's functional outcome.   REHAB POTENTIAL: Fair time since onset and poor attendance last episode  CLINICAL DECISION MAKING: Evolving/moderate complexity  EVALUATION COMPLEXITY: Moderate   GOALS: Goals reviewed with patient? Yes  SHORT TERM GOALS: Target date: 12/13/2023   Pain report to be no greater than 4/10  Baseline: Goal status: INITIAL  2.  Patient will be independent with initial HEP  Baseline:  Goal status: INITIAL   LONG TERM GOALS: Target date: 01/10/2024   Patient to report pain no greater than 2/10  Baseline:  Goal status: INITIAL  2.  Patient to be independent with advanced HEP  Baseline:  Goal status: INITIAL  3.  Patient to be able to return to work Baseline:  Goal status: INITIAL  4.  Patient to be able to avoid surgery on knees Baseline:  Goal status: INITIAL  5.  LEFS and Oswestry score to improve by 5% Baseline:  Goal status: INITIAL  6.  Functional tests to improve by 2-3 seconds Baseline:  Goal status: INITIAL  PLAN:  PT FREQUENCY: 2x/week  PT DURATION: 8 weeks  PLANNED INTERVENTIONS: 97110-Therapeutic exercises, 97530- Therapeutic activity, [id number]- Neuromuscular re-education, 97535- Self Care, 81191- Manual therapy, [id number]- Gait training, [id number]- Aquatic Therapy, 97014- Electrical stimulation (unattended), 97016- Vasopneumatic device, [id number]- Ultrasound, [id number]- Traction (mechanical), [id number]- Ionotophoresis 4mg /ml Dexamethasone, Patient/Family education, Balance training, Stair training, Taping, Dry Needling, Joint mobilization, Spinal mobilization, Cryotherapy, and Moist heat.  PLAN FOR NEXT SESSION: work on core, quad strength; DN next land visit.  Solon Palm, PT  [facility] [address] [place], [place] [address] Phone # [phone] Fax [phone]

## 2023-12-11 ENCOUNTER — Ambulatory Visit: Payer: Medicaid Other | Admitting: Physical Therapy

## 2023-12-12 ENCOUNTER — Encounter: Payer: Self-pay | Admitting: Physical Medicine and Rehabilitation

## 2023-12-12 ENCOUNTER — Ambulatory Visit: Payer: Medicaid Other | Admitting: Physical Medicine and Rehabilitation

## 2023-12-12 VITALS — BP 138/86 | HR 90

## 2023-12-12 DIAGNOSIS — M542 Cervicalgia: Secondary | ICD-10-CM | POA: Diagnosis not present

## 2023-12-12 DIAGNOSIS — M7918 Myalgia, other site: Secondary | ICD-10-CM | POA: Diagnosis not present

## 2023-12-12 MED ORDER — MELOXICAM 15 MG PO TABS: 15.0000 mg | ORAL_TABLET | Freq: Every day | ORAL | 0 refills | Status: DC

## 2023-12-12 NOTE — Therapy (Incomplete)
 OUTPATIENT PHYSICAL THERAPY THORACOLUMBAR TREATMENT   Patient Name: Donna [name] MRN: [id number] DOB:[birth year], 53 y.o., female Today's Date: 12/12/2023  END OF SESSION:     Past Medical History:  Diagnosis Date   Allergy    Anemia    Anxiety    Arthritis    Contraceptive management 04/21/2014   Environmental allergies 12/30/2014   Herpes    History of anemia 05/05/2016   History of herpes simplex infection 04/21/2014   Irregular bleeding 05/05/2015   Itching in the vaginal area 05/05/2016   Retained tampon 05/05/2015   Umbilical hernia 12/30/2014   Vaginal discharge 05/05/2015   Past Surgical History:  Procedure Laterality Date   BREAST BIOPSY Left 12/2021   CERVICAL CERCLAGE  2010 and 2004   [employer] cerclage   CESAREAN SECTION  10/10/2010   [facility]   HERNIA REPAIR     umbilical   UMBILICAL HERNIA REPAIR  01/30/2012   Procedure: HERNIA REPAIR UMBILICAL ADULT;  Surgeon: Fabio Bering, MD;  Location: [facility];  Service: General;  Laterality: N/A;  With mesh   Patient Active Problem List   Diagnosis Date Noted   Right knee pain 04/24/2023   Left knee pain 04/24/2023   Back pain 03/10/2023   Acute vaginitis 04/21/2022   Encounter for annual general medical examination with abnormal findings in adult 01/21/2022   Atopic dermatitis in adult 01/21/2022   Seasonal allergies 01/21/2022   Caregiver stress 01/21/2022   Depression, major, single episode, mild (HCC) 01/21/2022   Trichomoniasis 10/15/2019   Vaginal odor 10/09/2019   Vaginal bleeding 10/09/2019   Screening examination for STD (sexually transmitted disease) 10/09/2019   Elevated BP without diagnosis of hypertension 10/09/2019   Iron deficiency anemia due to chronic blood loss 10/09/2019   Uterine fibroids 09/11/2017   Screening for colorectal cancer 06/27/2017   History of anemia 05/05/2016   Irregular bleeding 05/05/2015   Retained tampon 05/05/2015   Umbilical hernia 12/30/2014   Environmental  allergies 12/30/2014   History of herpes simplex infection 04/21/2014    PCP: Gilmore Laroche, FNP   REFERRING PROVIDER: Gilmore Laroche, FNP   REFERRING DIAG: M54.50,G89.29 (ICD-10-CM) - Chronic low back pain, unspecified back pain laterality, unspecified whether sciatica present M62.81 (ICD-10-CM) - Muscle weakness (generalized) R29.3 (ICD-10-CM) - Abnormal posture  Rationale for Evaluation and Treatment: Rehabilitation  THERAPY DIAG:  No diagnosis found.  ONSET DATE: 11/05/2023  SUBJECTIVE:                                                                                                                                                                                           SUBJECTIVE  STATEMENT: ***   Pt reports she is a member of [employer].  Just moved from [place] to [place].   PERTINENT HISTORY:  Very active prior to first accident  PAIN:  Are you having pain? Yes: NPRS scale: 9/10 low back, 9/10 knees, 9/10 L shoulder  Pain location: see above Pain description: burning, aching Aggravating factors: walking, standing, start up Relieving factors: biofreeze, meds, ice/heat  PRECAUTIONS: None  RED FLAGS: None   WEIGHT BEARING RESTRICTIONS: No  FALLS:  Has patient fallen in last 6 months? 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  Get stronger and get back to being myself.  NEXT MD VISIT: In April  OBJECTIVE:  Note: Objective  measures were completed at Evaluation unless otherwise noted.  DIAGNOSTIC FINDINGS:  na  PATIENT SURVEYS:  Modified Oswestry 11/50= 22% disability  LEFS 57/80= 71% (29% disability)  COGNITION: Overall cognitive status: Within functional limits for tasks assessed     SENSATION: WFL  MUSCLE LENGTH: Hamstrings: Right approx 60 deg; Left approx 60 deg Thomas test: Right pos; Left pos  POSTURE:  Pronounced bilateral knee varus, neutral feet  PALPATION: Symmetry throughout shoulders, scapulae, pelvis, hips  LUMBAR ROM:   All WNL  LOWER EXTREMITY ROM:     WFL  LOWER EXTREMITY MMT:    Generally 4 to 4+/5  LUMBAR SPECIAL TESTS:  Slump test: Negative  FUNCTIONAL TESTS:  5 times sit to stand: complete next visit Timed up and go (TUG): complete next visit  GAIT: Distance walked: 50 feet Assistive device utilized: None Level of assistance: Complete Independence Comments: antalgic start up and gait  [facility] Adult PT Treatment:                                                DATE:  12/13/23 Nustep L5 x 7 min CHECK STGS *** Supine LTR x 10 each direction PPT x 10 Sequential march x 5 B Prone hip ext 1 x 10 B Prone opp arm/leg x 10 Plank x 5 max hold - last rep hurt knee Attempted at counter with shoulder taps but needs more practice 3 way SB stretch x 8 each direction Seated knee flexion with slider 2 x 10 bilateral  Seated SLR x 10 B Seated fig 4 x 30 sec ea  12/01/23 Pt seen for aquatic therapy today.  Treatment took place in water 3.5-4.75 ft in depth at the [facility] pool. Temp of water was 91.  Pt entered/exited the pool via stairs independently with bilat rail. - intro to aquatic therapy priniciples - UE On barbell at 44ft, 6" depth:  walking next to wall forward/ backward multiple times;  side stepping R/L 36ft x 4 - UE On wall:  relaxed squat x 5  (pause in squat for back relief) ; L stretch x 10s x 2;  hip abdct 2 x 5; single leg clam x 5 each;  relaxed squat  -  plank with UE on bench with hip ext x 5 each LE - seated on lift chair: LAQ, cycling, hip abdct/ addct   Pt requires the buoyancy and hydrostatic pressure of water for support, and to offload joints by unweighting joint load by at least 50 % in navel deep water and by at least 75-80% in chest to neck deep water.  Viscosity of the water is needed for resistance of strengthening. Water current perturbations provides challenge to standing balance requiring increased core activation.  11/28/23 Supine LTR x 10 each direction PPT x 10 Sequential march x 5 B Prone hip ext 1 x 10 B Prone opp arm/leg x 10 Plank x 5 max hold - last rep hurt knee Attempted at counter with shoulder taps but needs more practice 3 way SB stretch x 8 each direction Seated knee flexion with slider 2 x 10 bilateral  Seated SLR x 10 B Nustep L5 x 7 min Seated fig 4 x 30 sec ea Discussed next appointment at pool - directions and handout given   11/15/23      Initial eval completed, educated on her knee posture and the effect of this on her knee and lower leg pain, encouraged patient to resume previous HEP.                                                                                                                               PATIENT EDUCATION:  Education details: educated on her knee posture and the effect of this on her knee and lower leg pain, symmetry and kinetic chain effects Person educated: Patient Education method: Medical illustrator Education comprehension: verbalized understanding  HOME EXERCISE PROGRAM: Access Code: [email or web address] URL: [email or web address] Date: 11/28/2023 Prepared by: Raynelle Fanning  Exercises - Supine Lower Trunk Rotation  - 2 x daily - 7 x weekly - 1 sets - 10 reps - Seated Hamstring Stretch  - 2 x daily - 7 x weekly - 1 sets - 30 hold - Standing 'L' Stretch at Counter  - 2 x daily - 7 x weekly - 1 sets - 5 reps - Prone Quadriceps Stretch with Strap   - 2 x daily - 7 x weekly - 1 sets - 10 reps - Standing Hamstring Stretch on Chair  - 1 x daily - 7 x weekly - 3 sets - 10 reps - Quadricep Stretch with Chair and Counter Support  - 1 x daily - 7 x weekly - 3 sets - 10 reps - Supine Posterior Pelvic Tilt  - 1 x daily - 7 x weekly - 3 sets - 10 reps - Supine 90/90 Alternating Heel Touches with Posterior Pelvic Tilt  - 1 x daily - 7 x weekly - 3 sets - 10 reps - Supine Piriformis Stretch with Foot on Ground  - 1 x daily - 7 x weekly - 3 sets - 10 reps - Clamshell  - 1 x daily - 7 x weekly -  1 sets - 10 reps - Sidelying Open Book Thoracic Rotation with Knee on Foam Roll  - 1 x daily - 7 x weekly - 1 sets - 10 reps - Seated Quadratus Lumborum Stretch in Chair  - 1 x daily - 7 x weekly - 1 sets - 10 reps - Standing Quadratus Lumborum Stretch with Doorway (Mirrored)  - 1 x daily - 7 x weekly - 1 sets - 10 reps - Hooklying Sequential Leg March and Lower  - 1 x daily - 7 x weekly - 2 sets - 10 reps - Prone Alternating Arm and Leg Lifts  - 1 x daily - 7 x weekly - 3 sets - 10 reps - 3-5 sec hold - Seated Piriformis Stretch with Trunk Bend  - 2 x daily - 7 x weekly - 1 sets - 3 reps - 30-60 sec  hold   Pool info provided  ASSESSMENT:  CLINICAL IMPRESSION: ***   OBJECTIVE IMPAIRMENTS: decreased knowledge of condition, difficulty walking, decreased strength, increased fascial restrictions, increased muscle spasms, impaired flexibility, postural dysfunction, and pain.   ACTIVITY LIMITATIONS: carrying, lifting, bending, sitting, standing, squatting, sleeping, stairs, transfers, bed mobility, bathing, dressing, hygiene/grooming, and caring for others  PARTICIPATION LIMITATIONS: meal prep, cleaning, laundry, driving, shopping, community activity, occupation, and yard work  PERSONAL FACTORS: Fitness, Profession, and Time since onset of injury/illness/exacerbation are also affecting patient's functional outcome.   REHAB POTENTIAL: Fair time since onset  and poor attendance last episode  CLINICAL DECISION MAKING: Evolving/moderate complexity  EVALUATION COMPLEXITY: Moderate   GOALS: Goals reviewed with patient? Yes  SHORT TERM GOALS: Target date: 12/13/2023   Pain report to be no greater than 4/10  Baseline: Goal status: INITIAL  2.  Patient will be independent with initial HEP  Baseline:  Goal status: INITIAL   LONG TERM GOALS: Target date: 01/10/2024   Patient to report pain no greater than 2/10  Baseline:  Goal status: INITIAL  2.  Patient to be independent with advanced HEP  Baseline:  Goal status: INITIAL  3.  Patient to be able to return to work Baseline:  Goal status: INITIAL  4.  Patient to be able to avoid surgery on knees Baseline:  Goal status: INITIAL  5.  LEFS and Oswestry score to improve by 5% Baseline:  Goal status: INITIAL  6.  Functional tests to improve by 2-3 seconds Baseline:  Goal status: INITIAL  PLAN:  PT FREQUENCY: 2x/week  PT DURATION: 8 weeks  PLANNED INTERVENTIONS: 97110-Therapeutic exercises, 97530- Therapeutic activity, [id number]- Neuromuscular re-education, 97535- Self Care, 16109- Manual therapy, [id number]- Gait training, [phone]- Aquatic Therapy, 97014- Electrical stimulation (unattended), 97016- Vasopneumatic device, [id number]- Ultrasound, [id number]- Traction (mechanical), [id number]- Ionotophoresis 4mg /ml Dexamethasone, Patient/Family education, Balance training, Stair training, Taping, Dry Needling, Joint mobilization, Spinal mobilization, Cryotherapy, and Moist heat.  PLAN FOR NEXT SESSION: work on core, quad strength; DN next land visit.  Solon Palm, PT  [facility] [address], [address] [place], [place] [address] Phone # [phone] Fax [phone]

## 2023-12-12 NOTE — Progress Notes (Signed)
 Pain Score-7
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
## 2023-12-13 ENCOUNTER — Ambulatory Visit (HOSPITAL_BASED_OUTPATIENT_CLINIC_OR_DEPARTMENT_OTHER): Payer: Medicaid Other | Admitting: Physical Therapy

## 2023-12-13 ENCOUNTER — Ambulatory Visit: Admitting: Physical Therapy

## 2023-12-13 ENCOUNTER — Encounter (HOSPITAL_BASED_OUTPATIENT_CLINIC_OR_DEPARTMENT_OTHER): Payer: Self-pay

## 2023-12-15 NOTE — Therapy (Signed)
 OUTPATIENT PHYSICAL THERAPY THORACOLUMBAR TREATMENT   Patient Name: Donna [name] MRN: [id number] DOB:1971/08/26, 53 y.o., female Today's Date: 12/18/2023  END OF SESSION:  PT End of Session - 12/18/23 1018     Visit Number 4    Date for PT Re-Evaluation 01/10/24    Authorization Type Medicaid    Authorization Time Period 7 visits approved 2/5 to 01/13/24    Authorization - Visit Number 3    Authorization - Number of Visits 7    Progress Note Due on Visit 10    PT Start Time 1018    PT Stop Time 1058    PT Time Calculation (min) 40 min    Activity Tolerance Patient tolerated treatment well    Behavior During Therapy [facility] for tasks assessed/performed               Past Medical History:  Diagnosis Date   Allergy    Anemia    Anxiety    Arthritis    Contraceptive management 04/21/2014   Environmental allergies 12/30/2014   Herpes    History of anemia 05/05/2016   History of herpes simplex infection 04/21/2014   Irregular bleeding 05/05/2015   Itching in the vaginal area 05/05/2016   Retained tampon 05/05/2015   Umbilical hernia 12/30/2014   Vaginal discharge 05/05/2015   Past Surgical History:  Procedure Laterality Date   BREAST BIOPSY Left 12/2021   CERVICAL CERCLAGE  2010 and 2004   [employer] cerclage   CESAREAN SECTION  10/10/2010   Women[facility]   HERNIA REPAIR     umbilical   UMBILICAL HERNIA REPAIR  01/30/2012   Procedure: HERNIA REPAIR UMBILICAL ADULT;  Surgeon: Fabio Bering, MD;  Location: [facility];  Service: General;  Laterality: N/A;  With mesh   Patient Active Problem List   Diagnosis Date Noted   Right knee pain 04/24/2023   Left knee pain 04/24/2023   Back pain 03/10/2023   Acute vaginitis 04/21/2022   Encounter for annual general medical examination with abnormal findings in adult 01/21/2022   Atopic dermatitis in adult 01/21/2022   Seasonal allergies 01/21/2022   Caregiver stress 01/21/2022   Depression, major, single episode, mild (HCC)  01/21/2022   Trichomoniasis 10/15/2019   Vaginal odor 10/09/2019   Vaginal bleeding 10/09/2019   Screening examination for STD (sexually transmitted disease) 10/09/2019   Elevated BP without diagnosis of hypertension 10/09/2019   Iron deficiency anemia due to chronic blood loss 10/09/2019   Uterine fibroids 09/11/2017   Screening for colorectal cancer 06/27/2017   History of anemia 05/05/2016   Irregular bleeding 05/05/2015   Retained tampon 05/05/2015   Umbilical hernia 12/30/2014   Environmental allergies 12/30/2014   History of herpes simplex infection 04/21/2014    PCP: Gilmore Laroche, FNP   REFERRING PROVIDER: Gilmore Laroche, FNP   REFERRING DIAG: M54.50,G89.29 (ICD-10-CM) - Chronic low back pain, unspecified back pain laterality, unspecified whether sciatica present M62.81 (ICD-10-CM) - Muscle weakness (generalized) R29.3 (ICD-10-CM) - Abnormal posture  Rationale for Evaluation and Treatment: Rehabilitation  THERAPY DIAG:  Other low back pain  Muscle weakness (generalized)  Difficulty in walking, not elsewhere classified  Cramp and spasm  Left knee pain, unspecified chronicity  Chronic pain of right knee  ONSET DATE: 11/05/2023  SUBJECTIVE:  SUBJECTIVE STATEMENT: The pool makes everything feel better.  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  Just moved from [place] to [place].   PERTINENT HISTORY:  Very active prior to first accident  PAIN:  Are you having pain? Yes: NPRS scale: 8/10 B  low back, 8/10 knees, 9/10 L shoulder  Pain location: see above Pain description: burning, aching Aggravating factors: walking, standing, start up Relieving factors: biofreeze, meds, ice/heat  PRECAUTIONS: None  RED FLAGS: None   WEIGHT BEARING RESTRICTIONS: No  FALLS:  Has  patient fallen in last 6 months? 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  Get stronger and get back to being myself.  NEXT MD VISIT: In April  OBJECTIVE:  Note: Objective measures were completed at Evaluation unless otherwise noted.  DIAGNOSTIC FINDINGS:  na  PATIENT SURVEYS:  Modified Oswestry 11/50= 22% disability  LEFS 57/80= 71% (29% disability)  COGNITION: Overall cognitive status: Within functional limits for tasks assessed     SENSATION: WFL  MUSCLE LENGTH: Hamstrings: Right approx 60 deg; Left approx 60 deg Thomas test: Right pos; Left pos  POSTURE:  Pronounced bilateral knee varus, neutral feet  PALPATION: Symmetry throughout shoulders, scapulae, pelvis, hips  LUMBAR ROM:   All WNL  LOWER EXTREMITY ROM:     WFL  LOWER EXTREMITY MMT:    Generally 4 to 4+/5  LUMBAR SPECIAL TESTS:  Slump test: Negative  FUNCTIONAL TESTS:  5 times sit to stand: complete next visit Timed up and go (TUG): complete next visit  GAIT: Distance walked: 50 feet Assistive device utilized: None Level of assistance: Complete Independence Comments: antalgic start up and gait  [facility] Adult PT Treatment:                                                DATE:  12/18/23 Nustep L5 x 6 min 3 way SB stretch x 5 each direction Seated fig 4 2 x 30 sec ea Resisted walking 10# x 5 - 4 direction Self care: DN purpose, education and aftercare; discussion of knee  cartilage and her knee dx Manual: Trigger Point Dry Needling  Initial Treatment: Pt instructed on Dry Needling rational, procedures, and possible side effects. Pt instructed to expect mild to moderate muscle soreness later in the day and/or into the next day.  Pt instructed in methods to reduce muscle soreness. Pt instructed to continue prescribed HEP. Patient was educated on signs and symptoms of infection and other risk factors and advised to seek medical attention should they occur.  Patient verbalized understanding of these instructions and education.   Patient Verbal Consent Given: Yes Education Handout Provided: Yes Muscles Treated: Left quads Electrical Stimulation Performed: No Treatment Response/Outcome: Utilized skilled palpation to identify bony landmarks and trigger points.  Able to illicit twitch response and muscle elongation.  Soft tissue mobilization to L quads  following to further promote tissue elongation and decreased  pain.     12/01/23 Pt seen for aquatic therapy today.  Treatment took place in water 3.5-4.75 ft in depth at the [facility] pool. Temp of water was 91.  Pt entered/exited the pool via stairs independently with bilat rail. - intro to aquatic therapy priniciples - UE On barbell at 31ft, 6" depth:  walking next to wall forward/ backward multiple times;  side stepping R/L 37ft x 4 - UE On wall:  relaxed squat x 5  (pause in squat for back relief) ; L stretch x 10s x 2;  hip abdct 2 x 5; single leg clam x 5 each; relaxed squat  - plank with UE on bench with hip ext x 5 each LE - seated on lift chair: LAQ, cycling, hip abdct/ addct   Pt requires the buoyancy and hydrostatic pressure of water for support, and to offload joints by unweighting joint load by at least 50 % in navel deep water and by at least 75-80% in chest to neck deep water.  Viscosity of the water is needed for resistance of strengthening. Water current perturbations provides challenge to  standing balance requiring increased core activation.  11/28/23 Supine LTR x 10 each direction PPT x 10 Sequential march x 5 B Prone hip ext 1 x 10 B Prone opp arm/leg x 10 Plank x 5 max hold - last rep hurt knee Attempted at counter with shoulder taps but needs more practice 3 way SB stretch x 8 each direction Seated knee flexion with slider 2 x 10 bilateral  Seated SLR x 10 B Nustep L5 x 7 min Seated fig 4 x 30 sec ea Discussed next appointment at pool - directions and handout given   11/15/23      Initial eval completed, educated on her knee posture and the effect of this on her knee and lower leg pain, encouraged patient to resume previous HEP.                                                                                                                               PATIENT EDUCATION:  Education details: educated on her knee posture and the effect of this on her knee and lower leg pain, symmetry and kinetic chain effects Person educated: Patient Education method: Medical illustrator Education comprehension: verbalized understanding  HOME EXERCISE PROGRAM: Access Code: [email or web address] URL: [email or web address] Date: 11/28/2023 Prepared by: Raynelle Fanning  Exercises - Supine Lower Trunk Rotation  - 2 x daily - 7 x weekly - 1 sets - 10 reps - Seated Hamstring Stretch  - 2 x daily - 7 x weekly - 1 sets - 30 hold - Standing 'L' Stretch at Counter  - 2 x daily - 7 x weekly - 1 sets - 5 reps - Prone Quadriceps Stretch with Strap  - 2 x daily - 7 x weekly - 1 sets - 10 reps - Standing Hamstring Stretch on Chair  - 1 x daily -  7 x weekly - 3 sets - 10 reps - Theatre manager with Chair and Counter Support  - 1 x daily - 7 x weekly - 3 sets - 10 reps - Supine Posterior Pelvic Tilt  - 1 x daily - 7 x weekly - 3 sets - 10 reps - Supine 90/90 Alternating Heel Touches with Posterior Pelvic Tilt  - 1 x daily - 7 x weekly - 3 sets - 10 reps - Supine Piriformis Stretch with  Foot on Ground  - 1 x daily - 7 x weekly - 3 sets - 10 reps - Clamshell  - 1 x daily - 7 x weekly - 1 sets - 10 reps - Sidelying Open Book Thoracic Rotation with Knee on Foam Roll  - 1 x daily - 7 x weekly - 1 sets - 10 reps - Seated Quadratus Lumborum Stretch in Chair  - 1 x daily - 7 x weekly - 1 sets - 10 reps - Standing Quadratus Lumborum Stretch with Doorway (Mirrored)  - 1 x daily - 7 x weekly - 1 sets - 10 reps - Hooklying Sequential Leg March and Lower  - 1 x daily - 7 x weekly - 2 sets - 10 reps - Prone Alternating Arm and Leg Lifts  - 1 x daily - 7 x weekly - 3 sets - 10 reps - 3-5 sec hold - Seated Piriformis Stretch with Trunk Bend  - 2 x daily - 7 x weekly - 1 sets - 3 reps - 30-60 sec  hold   Pool info provided  ASSESSMENT:  CLINICAL IMPRESSION: Patient reports ongoing LBP and knee pain and states that a new referral has been sent for her neck. Plan to finish current episode and then address her neck. She feels relief in the pool, but then pain returns. We discussed that hopefully she can [facility] She requested a trial of DN today, but wanted to address her knees only. She has tightness and trigger points in B quads. Advised patient that DN would not necessarily have a big effect on the joint line pain in her knees, but that it may be beneficial for her general knee pain. Her left quads were treated, but then she wanted to stop. She reported normal soreness after DN/MT. She did well with resisted walking today with good control and no pain reported. She is not fully compliant with her HEP, but states she tries to do some of them. She continues to demonstrate potential for improvement and would benefit from continued skilled therapy to address impairments.     OBJECTIVE IMPAIRMENTS: decreased knowledge of condition, difficulty walking, decreased strength, increased fascial restrictions, increased muscle spasms, impaired flexibility, postural  dysfunction, and pain.   ACTIVITY LIMITATIONS: carrying, lifting, bending, sitting, standing, squatting, sleeping, stairs, transfers, bed mobility, bathing, dressing, hygiene/grooming, and caring for others  PARTICIPATION LIMITATIONS: meal prep, cleaning, laundry, driving, shopping, community activity, occupation, and yard work  PERSONAL FACTORS: Fitness, Profession, and Time since onset of injury/illness/exacerbation are also affecting patient's functional outcome.   REHAB POTENTIAL: Fair time since onset and poor attendance last episode  CLINICAL DECISION MAKING: Evolving/moderate complexity  EVALUATION COMPLEXITY: Moderate   GOALS: Goals reviewed with patient? Yes  SHORT TERM GOALS: Target date: 12/13/2023   Pain report to be no greater than 4/10  Baseline: Goal status: IN PROGRESS   2.  Patient will be independent with initial HEP  Baseline:  Goal status: PARTIALLY MET  LONG TERM GOALS: Target date: 01/10/2024   Patient to report pain no greater than 2/10  Baseline:  Goal status: INITIAL  2.  Patient to be independent with advanced HEP  Baseline:  Goal status: INITIAL  3.  Patient to be able to return to work Baseline:  Goal status: INITIAL  4.  Patient to be able to avoid surgery on knees Baseline:  Goal status: INITIAL  5.  LEFS and Oswestry score to improve by 5% Baseline:  Goal status: INITIAL  6.  Functional tests to improve by 2-3 seconds Baseline:  Goal status: INITIAL  PLAN:  PT FREQUENCY: 2x/week  PT DURATION: 8 weeks  PLANNED INTERVENTIONS: 97110-Therapeutic exercises, 97530- Therapeutic activity, [id number]- Neuromuscular re-education, 97535- Self Care, [id number]- Manual therapy, [id number]- Gait training, [phone]- Aquatic Therapy, 97014- Electrical stimulation (unattended), 97016- Vasopneumatic device, [id number]- Ultrasound, [id number]- Traction (mechanical), [id number]- Ionotophoresis 4mg /ml Dexamethasone, Patient/Family education, Balance training, Stair training,  Taping, Dry Needling, Joint mobilization, Spinal mobilization, Cryotherapy, and Moist heat.  PLAN FOR NEXT SESSION: work on core, quad strength; assess response to DN next land visit.  Solon Palm, PT 12/18/23 12:02 PM [facility] [address] [place], [place] [address] Phone # [phone] Fax [phone]

## 2023-12-18 ENCOUNTER — Ambulatory Visit: Payer: Medicaid Other | Attending: Family Medicine | Admitting: Physical Therapy

## 2023-12-18 ENCOUNTER — Encounter: Payer: Self-pay | Admitting: Physical Therapy

## 2023-12-18 DIAGNOSIS — M25561 Pain in right knee: Secondary | ICD-10-CM | POA: Insufficient documentation

## 2023-12-18 DIAGNOSIS — M25562 Pain in left knee: Secondary | ICD-10-CM | POA: Insufficient documentation

## 2023-12-18 DIAGNOSIS — G8929 Other chronic pain: Secondary | ICD-10-CM | POA: Diagnosis not present

## 2023-12-18 DIAGNOSIS — R293 Abnormal posture: Secondary | ICD-10-CM | POA: Insufficient documentation

## 2023-12-18 DIAGNOSIS — R252 Cramp and spasm: Secondary | ICD-10-CM | POA: Insufficient documentation

## 2023-12-18 DIAGNOSIS — M5459 Other low back pain: Secondary | ICD-10-CM | POA: Insufficient documentation

## 2023-12-18 DIAGNOSIS — R262 Difficulty in walking, not elsewhere classified: Secondary | ICD-10-CM | POA: Diagnosis not present

## 2023-12-18 DIAGNOSIS — M6281 Muscle weakness (generalized): Secondary | ICD-10-CM | POA: Insufficient documentation

## 2023-12-18 DIAGNOSIS — M542 Cervicalgia: Secondary | ICD-10-CM | POA: Insufficient documentation

## 2023-12-18 NOTE — Patient Instructions (Signed)

## 2023-12-20 ENCOUNTER — Ambulatory Visit (HOSPITAL_BASED_OUTPATIENT_CLINIC_OR_DEPARTMENT_OTHER): Admitting: Physical Therapy

## 2023-12-20 ENCOUNTER — Encounter (HOSPITAL_BASED_OUTPATIENT_CLINIC_OR_DEPARTMENT_OTHER): Payer: Self-pay

## 2023-12-20 ENCOUNTER — Ambulatory Visit (HOSPITAL_BASED_OUTPATIENT_CLINIC_OR_DEPARTMENT_OTHER): Payer: Medicaid Other | Admitting: Physical Therapy

## 2023-12-21 NOTE — Addendum Note (Signed)
 Addended by: Mikey Kirschner B on: 12/21/2023 10:23 AM   Modules accepted: Orders

## 2023-12-25 ENCOUNTER — Encounter: Payer: Medicaid Other | Admitting: Physical Therapy

## 2023-12-27 ENCOUNTER — Ambulatory Visit

## 2023-12-27 ENCOUNTER — Ambulatory Visit (HOSPITAL_BASED_OUTPATIENT_CLINIC_OR_DEPARTMENT_OTHER): Payer: Medicaid Other | Admitting: Physical Therapy

## 2023-12-27 DIAGNOSIS — M25562 Pain in left knee: Secondary | ICD-10-CM

## 2023-12-27 DIAGNOSIS — G8929 Other chronic pain: Secondary | ICD-10-CM

## 2023-12-27 DIAGNOSIS — R262 Difficulty in walking, not elsewhere classified: Secondary | ICD-10-CM

## 2023-12-27 DIAGNOSIS — R293 Abnormal posture: Secondary | ICD-10-CM

## 2023-12-27 DIAGNOSIS — M542 Cervicalgia: Secondary | ICD-10-CM | POA: Diagnosis not present

## 2023-12-27 DIAGNOSIS — R252 Cramp and spasm: Secondary | ICD-10-CM

## 2023-12-27 DIAGNOSIS — M5459 Other low back pain: Secondary | ICD-10-CM | POA: Diagnosis not present

## 2023-12-27 DIAGNOSIS — M6281 Muscle weakness (generalized): Secondary | ICD-10-CM

## 2023-12-27 DIAGNOSIS — M25561 Pain in right knee: Secondary | ICD-10-CM | POA: Diagnosis not present

## 2023-12-27 NOTE — Therapy (Signed)
 OUTPATIENT PHYSICAL THERAPY THORACOLUMBAR TREATMENT And CERVICAL EVALUATION   Patient Name: Donna [name] MRN: [id number] DOB:1970-11-25, 53 y.o., female Today's Date: 12/27/2023  END OF SESSION:  PT End of Session - 12/27/23 1639     Visit Number 5    Date for PT Re-Evaluation 01/10/24    Authorization Type Medicaid    Authorization Time Period 7 visits approved 2/5 to 01/13/24    Authorization - Visit Number 5    Authorization - Number of Visits 7    Progress Note Due on Visit 10    PT Start Time 1152    PT Stop Time 1230    PT Time Calculation (min) 38 min    Activity Tolerance Patient tolerated treatment well    Behavior During Therapy [facility] for tasks assessed/performed                Past Medical History:  Diagnosis Date   Allergy    Anemia    Anxiety    Arthritis    Contraceptive management 04/21/2014   Environmental allergies 12/30/2014   Herpes    History of anemia 05/05/2016   History of herpes simplex infection 04/21/2014   Irregular bleeding 05/05/2015   Itching in the vaginal area 05/05/2016   Retained tampon 05/05/2015   Umbilical hernia 12/30/2014   Vaginal discharge 05/05/2015   Past Surgical History:  Procedure Laterality Date   BREAST BIOPSY Left 12/2021   CERVICAL CERCLAGE  2010 and 2004   [employer] cerclage   CESAREAN SECTION  10/10/2010   Women's   HERNIA REPAIR     umbilical   UMBILICAL HERNIA REPAIR  01/30/2012   Procedure: HERNIA REPAIR UMBILICAL ADULT;  Surgeon: Fabio Bering, MD;  Location: [facility];  Service: General;  Laterality: N/A;  With mesh   Patient Active Problem List   Diagnosis Date Noted   Right knee pain 04/24/2023   Left knee pain 04/24/2023   Back pain 03/10/2023   Acute vaginitis 04/21/2022   Encounter for annual general medical examination with abnormal findings in adult 01/21/2022   Atopic dermatitis in adult 01/21/2022   Seasonal allergies 01/21/2022   Caregiver stress 01/21/2022   Depression, major,  single episode, mild (HCC) 01/21/2022   Trichomoniasis 10/15/2019   Vaginal odor 10/09/2019   Vaginal bleeding 10/09/2019   Screening examination for STD (sexually transmitted disease) 10/09/2019   Elevated BP without diagnosis of hypertension 10/09/2019   Iron deficiency anemia due to chronic blood loss 10/09/2019   Uterine fibroids 09/11/2017   Screening for colorectal cancer 06/27/2017   History of anemia 05/05/2016   Irregular bleeding 05/05/2015   Retained tampon 05/05/2015   Umbilical hernia 12/30/2014   Environmental allergies 12/30/2014   History of herpes simplex infection 04/21/2014    PCP: Gilmore Laroche, FNP   REFERRING PROVIDER: Gilmore Laroche, FNP   REFERRING DIAG: M54.50,G89.29 (ICD-10-CM) - Chronic low back pain, unspecified back pain laterality, unspecified whether sciatica present M62.81 (ICD-10-CM) - Muscle weakness (generalized) R29.3 (ICD-10-CM) - Abnormal posture, Cervicalgia  Rationale for Evaluation and Treatment: Rehabilitation  THERAPY DIAG:  Other low back pain - Plan: PT plan of care cert/re-cert  Cervicalgia - Plan: PT plan of care cert/re-cert  Left knee pain, unspecified chronicity - Plan: PT plan of care cert/re-cert  Chronic pain of right knee - Plan: PT plan of care cert/re-cert  Difficulty in walking, not elsewhere classified - Plan: PT plan of care cert/re-cert  Muscle weakness (generalized) - Plan: PT plan of care cert/re-cert  Cramp and spasm - Plan: PT plan of care cert/re-cert  Abnormal posture - Plan: PT plan of care cert/re-cert  ONSET DATE: 11/05/2023  SUBJECTIVE:                                                                                                                                                                                           SUBJECTIVE STATEMENT: Patient reports she is hurting terribly.  Knees especially.  She states the pool makes it worse but last session she stated that the pool makes everything  better.  She requests to do just land therapy.  We spent several minutes discussing that if pool therapy was causing pain then land based therapy would likely be worse for her.  She is being evaluated today for neck pain.    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  Just moved from [place] to [place].   PERTINENT HISTORY:  Very active prior to first accident  PAIN:  12/27/23 Are you having pain? Neck pain 6/10 Yes: NPRS scale: 8/10 B  low back, 9/10 knees, 9/10 L shoulder  Pain location: see above Pain description: burning, aching Aggravating factors: walking, standing, start up Relieving factors: biofreeze, meds, ice/heat  PRECAUTIONS: None  RED FLAGS: None   WEIGHT BEARING RESTRICTIONS: No  FALLS:  Has patient fallen in last 6 months? 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  Get stronger and get back to being myself.  NEXT MD VISIT: In April  OBJECTIVE:  Note: Objective measures were completed at Evaluation unless otherwise noted.  DIAGNOSTIC FINDINGS:  na  PATIENT SURVEYS:  Modified Oswestry 11/50= 22% disability  LEFS 57/80= 71% (29% disability)  12/27/23  NDI: 14/50= 28%  COGNITION: Overall cognitive status: Within functional limits for tasks assessed     SENSATION: WFL  MUSCLE LENGTH: Hamstrings: Right approx 60 deg; Left approx 60 deg Thomas test: Right pos; Left pos  POSTURE:  Pronounced bilateral knee varus, neutral  feet  PALPATION: Symmetry throughout shoulders, scapulae, pelvis, hips  CERVICAL ROM: All WNL with exception of rotation right which is WNL  but with significant pain   UPPER EXTREMITY:  LUMBAR ROM:   All  WNL  LOWER EXTREMITY ROM:     WFL  LOWER EXTREMITY MMT:    Generally 4 to 4+/5  LUMBAR SPECIAL TESTS:  Slump test: Negative  FUNCTIONAL TESTS:  5 times sit to stand: complete next visit Timed up and go (TUG): complete next visit  GAIT: Distance walked: 50 feet Assistive device utilized: None Level of assistance: Complete Independence Comments: antalgic start up and gait  [facility] Adult PT Treatment:                                                DATE:  12/27/23 (patient was late for appointment) Assessment for cervical spine completed Lengthy discussion about land based therapy vs aquatic therapy due to her multiple orthopedic issues, especially her knees.  She had previously stated that the water made everything feel better but stated today that she feels it makes her knees worse.  Educated patient on the benefit of hydrostatic pressure and  buoyancy that the water provides and how land based therapy would likely be less tolerable.  She is requesting to stick with land based therapy despite this information.   Initiated C spine HEP including ROM, upper trap and levator stretches Educated on sleeping positions for optimal comfort for her neck, low back and knee issues.     12/18/23 Nustep L5 x 6 min 3 way SB stretch x 5 each direction Seated fig 4 2 x 30 sec ea Resisted walking 10# x 5 - 4 direction Self care: DN purpose, education and aftercare; discussion of knee cartilage and her knee dx Manual: Trigger Point Dry Needling  Initial Treatment: Pt instructed on Dry Needling rational, procedures, and possible side effects. Pt instructed to expect mild to moderate muscle soreness later in the day and/or into the next day.  Pt instructed in methods to reduce muscle  soreness. Pt instructed to continue prescribed HEP. Patient was educated on signs and symptoms of infection and other risk factors and advised to seek medical attention should they occur.  Patient verbalized understanding of these instructions and education.   Patient Verbal Consent Given: Yes Education Handout Provided: Yes Muscles Treated: Left quads Electrical Stimulation Performed: No Treatment Response/Outcome: Utilized skilled palpation to identify bony landmarks and trigger points.  Able to illicit twitch response and muscle elongation.  Soft tissue mobilization to L quads  following to further promote tissue elongation and decreased pain.     12/01/23 Pt seen for aquatic therapy today.  Treatment took place in water 3.5-4.75 ft in depth at the [facility] pool. Temp of water was 91.  Pt entered/exited the pool via stairs independently with bilat rail. - intro to aquatic therapy priniciples - UE On barbell at 15ft, 6" depth:  walking next to wall forward/ backward multiple times;  side stepping R/L 47ft x 4 - UE On wall:  relaxed squat x 5  (pause in squat for back relief) ; L stretch x 10s x 2;  hip abdct 2 x 5; single leg clam x 5 each; relaxed squat  - plank with UE on bench with hip ext x 5 each LE - seated on lift chair: LAQ, cycling, hip abdct/ addct   Pt requires the buoyancy and hydrostatic pressure of water for support, and to offload joints by unweighting joint load by at least 50 % in navel deep water and by at least  75-80% in chest to neck deep water.  Viscosity of the water is needed for resistance of strengthening. Water current perturbations provides challenge to standing balance requiring increased core activation.  11/15/23      Initial eval completed, educated on her knee posture and the effect of this on her knee and lower leg pain, encouraged patient to resume previous HEP.                                                                                                                                PATIENT EDUCATION:  Education details: educated on her knee posture and the effect of this on her knee and lower leg pain, symmetry and kinetic chain effects Person educated: Patient Education method: Medical illustrator Education comprehension: verbalized understanding  HOME EXERCISE PROGRAM: Access Code: [email or web address] URL: [email or web address] Date: 11/28/2023 Prepared by: Raynelle Fanning  Exercises - Supine Lower Trunk Rotation  - 2 x daily - 7 x weekly - 1 sets - 10 reps - Seated Hamstring Stretch  - 2 x daily - 7 x weekly - 1 sets - 30 hold - Standing 'L' Stretch at Counter  - 2 x daily - 7 x weekly - 1 sets - 5 reps - Prone Quadriceps Stretch with Strap  - 2 x daily - 7 x weekly - 1 sets - 10 reps - Standing Hamstring Stretch on Chair  - 1 x daily - 7 x weekly - 3 sets - 10 reps - Quadricep Stretch with Chair and Counter Support  - 1 x daily - 7 x weekly - 3 sets - 10 reps - Supine Posterior Pelvic Tilt  - 1 x daily - 7 x weekly - 3 sets - 10 reps - Supine 90/90 Alternating Heel Touches with Posterior Pelvic Tilt  - 1 x daily - 7 x weekly - 3 sets - 10 reps - Supine Piriformis Stretch with Foot on Ground  - 1 x daily - 7 x weekly - 3 sets - 10 reps - Clamshell  - 1 x daily - 7 x weekly - 1 sets - 10 reps - Sidelying Open Book Thoracic Rotation with Knee on Foam Roll  - 1 x daily - 7 x weekly - 1 sets - 10 reps - Seated Quadratus Lumborum Stretch in Chair  - 1 x daily - 7 x weekly - 1 sets - 10 reps - Standing Quadratus Lumborum Stretch with Doorway (Mirrored)  - 1 x daily - 7 x weekly - 1 sets - 10 reps - Hooklying Sequential Leg March and Lower  - 1 x daily - 7 x weekly - 2 sets - 10 reps - Prone Alternating Arm and Leg Lifts  - 1 x daily - 7 x weekly - 3 sets - 10 reps - 3-5 sec hold - Seated Piriformis Stretch with Trunk Bend  - 2 x daily - 7 x weekly - 1 sets - 3 reps - 30-60 sec  hold   Pool info  provided  ASSESSMENT:  CLINICAL IMPRESSION: Patient was assess for C spine pain today.  She is not significantly limited in her neck ROM.  UE strength is good. No radicular symptoms. Shoulder ROM WFL.  We initiated basic C spine stretches for the minor limitations she is having.  She does have a fair amount of postural issues and taut bands in bilateral upper traps.  She would benefit from skilled PT for C spine ROM, postural strengthening, shoulder strengthening and pain control.  We will addend/renew her POC to include these interventions.     OBJECTIVE IMPAIRMENTS: decreased knowledge of condition, difficulty walking, decreased strength, increased fascial restrictions, increased muscle spasms, impaired flexibility, postural dysfunction, and pain.   ACTIVITY LIMITATIONS: carrying, lifting, bending, sitting, standing, squatting, sleeping, stairs, transfers, bed mobility, bathing, dressing, hygiene/grooming, and caring for others  PARTICIPATION LIMITATIONS: meal prep, cleaning, laundry, driving, shopping, community activity, occupation, and yard work  PERSONAL FACTORS: Fitness, Profession, and Time since onset of injury/illness/exacerbation are also affecting patient's functional outcome.   REHAB POTENTIAL: Fair time since onset and poor attendance last episode  CLINICAL DECISION MAKING: Evolving/moderate complexity  EVALUATION COMPLEXITY: Moderate   GOALS: Goals reviewed with patient? Yes  SHORT TERM GOALS: Target date: 12/13/2023   Pain report to be no greater than 4/10  Baseline: Goal status: IN PROGRESS   2.  Patient will be independent with initial HEP  Baseline:  Goal status: PARTIALLY MET   LONG TERM GOALS: Target date: 01/10/2024   Patient to report pain no greater than 2/10  Baseline:  Goal status: INITIAL  2.  Patient to be independent with advanced HEP  Baseline:  Goal status: INITIAL  3.  Patient to be able to return to work Baseline:  Goal status:  INITIAL  4.  Patient to be able to avoid surgery on knees Baseline:  Goal status: INITIAL  5.  LEFS and Oswestry score to improve by 5% Baseline:  Goal status: INITIAL  6.  Functional tests to improve by 2-3 seconds Baseline:  Goal status: INITIAL  7.  Full C spine ROM without pain  Baseline: pain with rotation right  Goal status: INITIAL  8. Patient to be able to sleep through the night   Baseline:  Goal status: INITIAL  PLAN:  PT FREQUENCY: 2x/week  PT DURATION: 8 weeks  PLANNED INTERVENTIONS: 97110-Therapeutic exercises, 97530- Therapeutic activity, [id number]- Neuromuscular re-education, 97535- Self Care, 16109- Manual therapy, [id number]- Gait training, [phone]- Aquatic Therapy, 97014- Electrical stimulation (unattended), 97016- Vasopneumatic device, [id number]- Ultrasound, [id number]- Traction (mechanical), [id number]- Ionotophoresis 4mg /ml Dexamethasone, Patient/Family education, Balance training, Stair training, Taping, Dry Needling, Joint mobilization, Spinal mobilization, Cryotherapy, and Moist heat.  PLAN FOR NEXT SESSION: Add in C spine ROM, postural strength, possibly DN for neck and upper traps, work on core, quad strength; assess response to DN next land visit.  Victorino Dike B. Lilly Gasser, PT 12/27/23 5:04 PM [facility] [address] [place], [place] [address] Phone # [phone] Fax [phone]

## 2023-12-28 ENCOUNTER — Telehealth: Payer: Self-pay | Admitting: Physical Medicine and Rehabilitation

## 2023-12-28 NOTE — Telephone Encounter (Signed)
 Received vm from patient to transfer her medical records.IC [phone] advising needs to complete authorization to process request.

## 2024-01-01 ENCOUNTER — Encounter: Payer: Medicaid Other | Admitting: Physical Therapy

## 2024-01-03 ENCOUNTER — Ambulatory Visit

## 2024-01-03 ENCOUNTER — Ambulatory Visit (HOSPITAL_BASED_OUTPATIENT_CLINIC_OR_DEPARTMENT_OTHER): Payer: Medicaid Other | Admitting: Physical Therapy

## 2024-01-08 ENCOUNTER — Encounter: Payer: Medicaid Other | Admitting: Physical Therapy

## 2024-01-11 ENCOUNTER — Ambulatory Visit: Attending: Family Medicine | Admitting: Physical Therapy

## 2024-01-11 DIAGNOSIS — M6281 Muscle weakness (generalized): Secondary | ICD-10-CM | POA: Diagnosis not present

## 2024-01-11 DIAGNOSIS — M542 Cervicalgia: Secondary | ICD-10-CM | POA: Diagnosis not present

## 2024-01-11 DIAGNOSIS — R252 Cramp and spasm: Secondary | ICD-10-CM

## 2024-01-11 DIAGNOSIS — M25562 Pain in left knee: Secondary | ICD-10-CM

## 2024-01-11 DIAGNOSIS — M5459 Other low back pain: Secondary | ICD-10-CM | POA: Diagnosis not present

## 2024-01-11 DIAGNOSIS — R293 Abnormal posture: Secondary | ICD-10-CM | POA: Diagnosis not present

## 2024-01-11 DIAGNOSIS — M25561 Pain in right knee: Secondary | ICD-10-CM | POA: Insufficient documentation

## 2024-01-11 DIAGNOSIS — G8929 Other chronic pain: Secondary | ICD-10-CM

## 2024-01-11 DIAGNOSIS — R262 Difficulty in walking, not elsewhere classified: Secondary | ICD-10-CM | POA: Diagnosis not present

## 2024-01-11 NOTE — Therapy (Signed)
 OUTPATIENT PHYSICAL THERAPY THORACOLUMBAR AND CERVICAL  TREATMENT    Patient Name: Donna [name] MRN: [id number] DOB:26-May-1971, 53 y.o., female Today's Date: 01/11/2024  END OF SESSION:  PT End of Session - 01/11/24 1527     Visit Number 6    Date for PT Re-Evaluation 02/22/24    Authorization Type Medicaid    Authorization Time Period 7 visits approved 2/5 to 01/13/24; request for 10 visits 01/13/24 to 02/22/24    Authorization - Visit Number 6    Authorization - Number of Visits 7    Progress Note Due on Visit 10    PT Start Time 1530    PT Stop Time 1610    PT Time Calculation (min) 40 min    Activity Tolerance Patient tolerated treatment well    Behavior During Therapy [facility] for tasks assessed/performed              Past Medical History:  Diagnosis Date   Allergy    Anemia    Anxiety    Arthritis    Contraceptive management 04/21/2014   Environmental allergies 12/30/2014   Herpes    History of anemia 05/05/2016   History of herpes simplex infection 04/21/2014   Irregular bleeding 05/05/2015   Itching in the vaginal area 05/05/2016   Retained tampon 05/05/2015   Umbilical hernia 12/30/2014   Vaginal discharge 05/05/2015   Past Surgical History:  Procedure Laterality Date   BREAST BIOPSY Left 12/2021   CERVICAL CERCLAGE  2010 and 2004   [employer] cerclage   CESAREAN SECTION  10/10/2010   [facility]   HERNIA REPAIR     umbilical   UMBILICAL HERNIA REPAIR  01/30/2012   Procedure: HERNIA REPAIR UMBILICAL ADULT;  Surgeon: Fabio Bering, MD;  Location: [facility];  Service: General;  Laterality: N/A;  With mesh   Patient Active Problem List   Diagnosis Date Noted   Right knee pain 04/24/2023   Left knee pain 04/24/2023   Back pain 03/10/2023   Acute vaginitis 04/21/2022   Encounter for annual general medical examination with abnormal findings in adult 01/21/2022   Atopic dermatitis in adult 01/21/2022   Seasonal allergies 01/21/2022   Caregiver stress  01/21/2022   Depression, major, single episode, mild (HCC) 01/21/2022   Trichomoniasis 10/15/2019   Vaginal odor 10/09/2019   Vaginal bleeding 10/09/2019   Screening examination for STD (sexually transmitted disease) 10/09/2019   Elevated BP without diagnosis of hypertension 10/09/2019   Iron deficiency anemia due to chronic blood loss 10/09/2019   Uterine fibroids 09/11/2017   Screening for colorectal cancer 06/27/2017   History of anemia 05/05/2016   Irregular bleeding 05/05/2015   Retained tampon 05/05/2015   Umbilical hernia 12/30/2014   Environmental allergies 12/30/2014   History of herpes simplex infection 04/21/2014    PCP: Gilmore Laroche, FNP   REFERRING PROVIDER: Gilmore Laroche, FNP   REFERRING DIAG: M54.50,G89.29 (ICD-10-CM) - Chronic low back pain, unspecified back pain laterality, unspecified whether sciatica present M62.81 (ICD-10-CM) - Muscle weakness (generalized) R29.3 (ICD-10-CM) - Abnormal posture, Cervicalgia  Rationale for Evaluation and Treatment: Rehabilitation  THERAPY DIAG:  Other low back pain  Cervicalgia  Left knee pain, unspecified chronicity  Chronic pain of right knee  Difficulty in walking, not elsewhere classified  Muscle weakness (generalized)  Cramp and spasm  ONSET DATE: 11/05/2023  SUBJECTIVE:  SUBJECTIVE STATEMENT: Pt states back, knees and neck are still hurting. Bilat shoulders/neck are hurting the most today. Pt reports she's been trying to do the exercises. 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 Pt states she is getting ~5 hours of sleep.   PERTINENT HISTORY:  Very active prior to first accident  PAIN:  01/11/24 Are you having pain? 8/10 general pain Neck pain 9/10 Yes: NPRS scale: 8/10 B   low back, 9/10 knees, 9/10 L shoulder  Pain location: see above Pain description: burning, aching Aggravating factors: walking, standing, start up Relieving factors: biofreeze, meds, ice/heat  PRECAUTIONS: None  RED FLAGS: None   WEIGHT BEARING RESTRICTIONS: No  FALLS:  Has patient fallen in last 6 months? 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  Get stronger and get back to being myself.  NEXT MD VISIT: In April  OBJECTIVE:  Note: Objective measures were completed at Evaluation unless otherwise noted.  DIAGNOSTIC FINDINGS:  na  PATIENT SURVEYS:  Modified Oswestry 11/50= 22% disability  LEFS 57/80= 71% (29% disability)  12/27/23  NDI: 14/50= 28%  01/11/24: Modified Oswestry Low Back Pain Disability Questionnaire: 22 / 50 = 44.0 % Lower Extremity Functional Score: 54 / 80 = 67.5 %  MUSCLE LENGTH: Hamstrings: Right approx 60 deg; Left approx 60 deg Thomas test: Right pos; Left pos  POSTURE:  Pronounced bilateral knee varus, neutral feet  PALPATION: Symmetry throughout shoulders, scapulae, pelvis, hips  CERVICAL ROM: All WNL with exception of rotation right which is WNL  but with significant pain   UPPER EXTREMITY:  LUMBAR ROM:   All WNL  LOWER EXTREMITY ROM:     WFL  LOWER EXTREMITY MMT:    Generally 4 to 4+/5  LUMBAR SPECIAL TESTS:  Slump test: Negative  FUNCTIONAL TESTS:  5 times sit to stand: 10.58 sec (01/11/24) Timed  up and go (TUG): 8.52 sec (01/11/24)  GAIT: Distance walked: 50 feet Assistive device utilized: None Level of assistance: Complete Independence Comments: antalgic start up and gait  [facility] Adult PT Treatment:                                                DATE:  01/11/24 Rechecked goals for auth/re-cert Child's pose x30" with lateral flexion x30" Quadruped cat/cow x10 Quadruped thread the needle x30" UT stretch x30" Levator scap stretch x 30" Cervical retraction x10 with suboccipital stretch Scap squeeze 2x10 Seated shoulder ER 2x10  12/27/23 (patient was late for appointment) Assessment for cervical spine completed Lengthy discussion about land based therapy vs aquatic therapy due to her multiple orthopedic issues, especially her knees.  She had previously stated that the water made everything feel better but stated today that she feels it makes her knees worse.  Educated patient on the benefit of hydrostatic pressure and  buoyancy that the water provides and how land based  therapy would likely be less tolerable.  She is requesting to stick with land based therapy despite this information.   Initiated C spine HEP including ROM, upper trap and levator stretches Educated on sleeping positions for optimal comfort for her neck, low back and knee issues.     12/18/23 Nustep L5 x 6 min 3 way SB stretch x 5 each direction Seated fig 4 2 x 30 sec ea Resisted walking 10# x 5 - 4 direction Self care: DN purpose, education and aftercare; discussion of knee cartilage and her knee dx Manual: Trigger Point Dry Needling  Initial Treatment: Pt instructed on Dry Needling rational, procedures, and possible side effects. Pt instructed to expect mild to moderate muscle soreness later in the day and/or into the next day.  Pt instructed in methods to reduce muscle soreness. Pt instructed to continue prescribed HEP. Patient was educated on signs and symptoms of infection and other risk factors and advised  to seek medical attention should they occur.  Patient verbalized understanding of these instructions and education.   Patient Verbal Consent Given: Yes Education Handout Provided: Yes Muscles Treated: Left quads Electrical Stimulation Performed: No Treatment Response/Outcome: Utilized skilled palpation to identify bony landmarks and trigger points.  Able to illicit twitch response and muscle elongation.  Soft tissue mobilization to L quads  following to further promote tissue elongation and decreased pain.     12/01/23 Pt seen for aquatic therapy today.  Treatment took place in water 3.5-4.75 ft in depth at the [facility] pool. Temp of water was 91.  Pt entered/exited the pool via stairs independently with bilat rail. - intro to aquatic therapy priniciples - UE On barbell at 26ft, 6" depth:  walking next to wall forward/ backward multiple times;  side stepping R/L 13ft x 4 - UE On wall:  relaxed squat x 5  (pause in squat for back relief) ; L stretch x 10s x 2;  hip abdct 2 x 5; single leg clam x 5 each; relaxed squat  - plank with UE on bench with hip ext x 5 each LE - seated on lift chair: LAQ, cycling, hip abdct/ addct   Pt requires the buoyancy and hydrostatic pressure of water for support, and to offload joints by unweighting joint load by at least 50 % in navel deep water and by at least 75-80% in chest to neck deep water.  Viscosity of the water is needed for resistance of strengthening. Water current perturbations provides challenge to standing balance requiring increased core activation.  11/15/23      Initial eval completed, educated on her knee posture and the effect of this on her knee and lower leg pain, encouraged patient to resume previous HEP.                                                                                                                               PATIENT EDUCATION:  Education details: educated on  her knee posture and the effect of this on her  knee and lower leg pain, symmetry and kinetic chain effects Person educated: Patient Education method: Explanation and Demonstration Education comprehension: verbalized understanding  HOME EXERCISE PROGRAM: Access Code: [email or web address] URL: [email or web address] Date: 11/28/2023 Prepared by: Raynelle Fanning  Exercises - Supine Lower Trunk Rotation  - 2 x daily - 7 x weekly - 1 sets - 10 reps - Seated Hamstring Stretch  - 2 x daily - 7 x weekly - 1 sets - 30 hold - Standing 'L' Stretch at Counter  - 2 x daily - 7 x weekly - 1 sets - 5 reps - Prone Quadriceps Stretch with Strap  - 2 x daily - 7 x weekly - 1 sets - 10 reps - Standing Hamstring Stretch on Chair  - 1 x daily - 7 x weekly - 3 sets - 10 reps - Quadricep Stretch with Chair and Counter Support  - 1 x daily - 7 x weekly - 3 sets - 10 reps - Supine Posterior Pelvic Tilt  - 1 x daily - 7 x weekly - 3 sets - 10 reps - Supine 90/90 Alternating Heel Touches with Posterior Pelvic Tilt  - 1 x daily - 7 x weekly - 3 sets - 10 reps - Supine Piriformis Stretch with Foot on Ground  - 1 x daily - 7 x weekly - 3 sets - 10 reps - Clamshell  - 1 x daily - 7 x weekly - 1 sets - 10 reps - Sidelying Open Book Thoracic Rotation with Knee on Foam Roll  - 1 x daily - 7 x weekly - 1 sets - 10 reps - Seated Quadratus Lumborum Stretch in Chair  - 1 x daily - 7 x weekly - 1 sets - 10 reps - Standing Quadratus Lumborum Stretch with Doorway (Mirrored)  - 1 x daily - 7 x weekly - 1 sets - 10 reps - Hooklying Sequential Leg March and Lower  - 1 x daily - 7 x weekly - 2 sets - 10 reps - Prone Alternating Arm and Leg Lifts  - 1 x daily - 7 x weekly - 3 sets - 10 reps - 3-5 sec hold - Seated Piriformis Stretch with Trunk Bend  - 2 x daily - 7 x weekly - 1 sets - 3 reps - 30-60 sec  hold   Pool info provided  ASSESSMENT:  CLINICAL IMPRESSION: Patient reports pain symptoms did ease by end of session. No overt tenderness to palpation along L low back/flank today  with pt noting that it is not all the time that it is only with certain movements that she will feel it -- deferred dry needling but did provide stretches in quadruped to address her whole body pain. 5x STS and TUG score are WNL and do not indicate a fall risk. LEFS and modified oswestry scores have worsened since initial evaluation. We have just evaluated and started addressing her cervical pain last session. Pt states she feels she needs more PT sessions to continue to address her spinal deficits and pain. At this point, none of pt's goals have been met. Has been limited due to transportation issues.    OBJECTIVE IMPAIRMENTS: decreased knowledge of condition, difficulty walking, decreased strength, increased fascial restrictions, increased muscle spasms, impaired flexibility, postural dysfunction, and pain.   ACTIVITY LIMITATIONS: carrying, lifting, bending, sitting, standing, squatting, sleeping, stairs, transfers, bed mobility, bathing, dressing, hygiene/grooming, and caring for others  PARTICIPATION LIMITATIONS: meal prep, cleaning, laundry, driving,  shopping, community activity, occupation, and yard work  PERSONAL FACTORS: Fitness, Profession, and Time since onset of injury/illness/exacerbation are also affecting patient's functional outcome.   REHAB POTENTIAL: Fair time since onset and poor attendance last episode  CLINICAL DECISION MAKING: Evolving/moderate complexity  EVALUATION COMPLEXITY: Moderate   GOALS: Goals reviewed with patient? Yes  SHORT TERM GOALS: Target date: 12/13/2023   Pain report to be no greater than 4/10  Baseline: Goal status: NOT MET  2.  Patient will be independent with initial HEP  Baseline:  Goal status: PARTIALLY MET   LONG TERM GOALS: Target date: 02/22/2024    Patient to report pain no greater than 2/10  Baseline:  Goal status: NOT MET  2.  Patient to be independent with advanced HEP  Baseline:  Goal status: PARTIALLY MET  3.  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  Patient to be able to avoid surgery on knees Baseline:  Goal status: IN PROGRESS -- MRI and imaging waiting  5.  LEFS and Oswestry score to improve by 5% Baseline:  Goal status: IN PROGRESS  6.  Functional tests to improve by 2-3 seconds Baseline: 5x STS 10.58 sec, TUG  Goal status: IN PROGRESS  7.  Full C spine ROM without pain  Baseline: pain with rotation right  Goal status: IN PROGRESS   8. Patient to be able to sleep through the night   Baseline:  01/11/24: sleeping for 1/2 the night (~5 hours)  Goal status: IN PROGRESS  PLAN:  PT FREQUENCY: 1-2x/week  PT DURATION: 6 weeks  PLANNED INTERVENTIONS: 97110-Therapeutic exercises, 97530- Therapeutic activity, [id number]- Neuromuscular re-education, 97535- Self Care, 91478- Manual therapy, [id number]- Gait training, [phone]- Aquatic Therapy, 97014- Electrical stimulation (unattended), 97016- Vasopneumatic device, [id number]- Ultrasound, [id number]- Traction (mechanical), [id number]- Ionotophoresis 4mg /ml Dexamethasone, Patient/Family education, Balance training, Stair training, Taping, Dry Needling, Joint mobilization, Spinal mobilization, Cryotherapy, and Moist heat.  PLAN FOR NEXT SESSION: Add in C spine ROM, postural strength, possibly DN for neck and upper traps, work on core, quad strength; assess response to DN next land visit.  [facility] [address] [place], PT 01/11/24 5:14 PM [facility] [address], [address] [place], [place] [address] Phone # [phone] Fax [phone]

## 2024-01-19 ENCOUNTER — Other Ambulatory Visit: Payer: Self-pay | Admitting: Physical Medicine and Rehabilitation

## 2024-01-23 ENCOUNTER — Ambulatory Visit

## 2024-02-05 ENCOUNTER — Ambulatory Visit: Admitting: Physical Therapy

## 2024-02-05 ENCOUNTER — Encounter: Payer: Self-pay | Admitting: Physical Therapy

## 2024-02-05 DIAGNOSIS — M25561 Pain in right knee: Secondary | ICD-10-CM | POA: Diagnosis not present

## 2024-02-05 DIAGNOSIS — R252 Cramp and spasm: Secondary | ICD-10-CM

## 2024-02-05 DIAGNOSIS — R293 Abnormal posture: Secondary | ICD-10-CM | POA: Diagnosis not present

## 2024-02-05 DIAGNOSIS — M25562 Pain in left knee: Secondary | ICD-10-CM

## 2024-02-05 DIAGNOSIS — G8929 Other chronic pain: Secondary | ICD-10-CM | POA: Diagnosis not present

## 2024-02-05 DIAGNOSIS — M542 Cervicalgia: Secondary | ICD-10-CM

## 2024-02-05 DIAGNOSIS — R262 Difficulty in walking, not elsewhere classified: Secondary | ICD-10-CM

## 2024-02-05 DIAGNOSIS — M5459 Other low back pain: Secondary | ICD-10-CM

## 2024-02-05 DIAGNOSIS — M6281 Muscle weakness (generalized): Secondary | ICD-10-CM | POA: Diagnosis not present

## 2024-02-05 NOTE — Therapy (Signed)
 OUTPATIENT PHYSICAL THERAPY THORACOLUMBAR AND CERVICAL  TREATMENT    Patient Name: Donna [name] MRN: [id number] DOB:[birth year], 53 y.o., female Today's Date: 02/05/2024  END OF SESSION:  PT End of Session - 02/05/24 1615     Visit Number 7    Date for PT Re-Evaluation 02/22/24    Authorization Type Medicaid    Authorization Time Period 4 vists approved 4/5 to 5/4    Authorization - Visit Number 1    Authorization - Number of Visits 4    PT Start Time 1532    PT Stop Time 1616    PT Time Calculation (min) 44 min    Activity Tolerance Patient tolerated treatment well    Behavior During Therapy [facility] for tasks assessed/performed               Past Medical History:  Diagnosis Date   Allergy    Anemia    Anxiety    Arthritis    Contraceptive management 04/21/2014   Environmental allergies 12/30/2014   Herpes    History of anemia 05/05/2016   History of herpes simplex infection 04/21/2014   Irregular bleeding 05/05/2015   Itching in the vaginal area 05/05/2016   Retained tampon 05/05/2015   Umbilical hernia 12/30/2014   Vaginal discharge 05/05/2015   Past Surgical History:  Procedure Laterality Date   BREAST BIOPSY Left 12/2021   CERVICAL CERCLAGE  2010 and 2004   [employer] cerclage   CESAREAN SECTION  10/10/2010   Women[facility]   HERNIA REPAIR     umbilical   UMBILICAL HERNIA REPAIR  01/30/2012   Procedure: HERNIA REPAIR UMBILICAL ADULT;  Surgeon: Lovena Rubinstein, MD;  Location: [facility];  Service: General;  Laterality: N/A;  With mesh   Patient Active Problem List   Diagnosis Date Noted   Right knee pain 04/24/2023   Left knee pain 04/24/2023   Back pain 03/10/2023   Acute vaginitis 04/21/2022   Encounter for annual general medical examination with abnormal findings in adult 01/21/2022   Atopic dermatitis in adult 01/21/2022   Seasonal allergies 01/21/2022   Caregiver stress 01/21/2022   Depression, major, single episode, mild (HCC) 01/21/2022    Trichomoniasis 10/15/2019   Vaginal odor 10/09/2019   Vaginal bleeding 10/09/2019   Screening examination for STD (sexually transmitted disease) 10/09/2019   Elevated BP without diagnosis of hypertension 10/09/2019   Iron deficiency anemia due to chronic blood loss 10/09/2019   Uterine fibroids 09/11/2017   Screening for colorectal cancer 06/27/2017   History of anemia 05/05/2016   Irregular bleeding 05/05/2015   Retained tampon 05/05/2015   Umbilical hernia 12/30/2014   Environmental allergies 12/30/2014   History of herpes simplex infection 04/21/2014    PCP: Zarwolo, Gloria, FNP   REFERRING PROVIDER: Zarwolo, Gloria, FNP   REFERRING DIAG: M54.50,G89.29 (ICD-10-CM) - Chronic low back pain, unspecified back pain laterality, unspecified whether sciatica present M62.81 (ICD-10-CM) - Muscle weakness (generalized) R29.3 (ICD-10-CM) - Abnormal posture, Cervicalgia  Rationale for Evaluation and Treatment: Rehabilitation  THERAPY DIAG:  Other low back pain  Cervicalgia  Left knee pain, unspecified chronicity  Chronic pain of right knee  Difficulty in walking, not elsewhere classified  Muscle weakness (generalized)  Cramp and spasm  Abnormal posture  ONSET DATE: 11/05/2023  SUBJECTIVE:  SUBJECTIVE STATEMENT: Still having low back pain, less neck pain and knee pain. I've been going to [employer], doing my HEP and walking. The arthritis is letting up now that it is getting warmer. Thinking about the knee replacement, but will be seeing Dr. Bernard Brick regarding this soon. Supposed to go back to work May 5, but doing Quality so will be sitting and walking some.  PERTINENT HISTORY:  Very active prior to first accident  PAIN:  01/11/24 Are you having pain? 8/10 general pain Neck pain 9/10 Yes:  NPRS scale: 5/10 B  low back, 7/10 knees, 5/10 neck  Pain location: see above Pain description: burning, aching Aggravating factors: walking, standing, start up Relieving factors: biofreeze, meds, ice/heat  PRECAUTIONS: None  RED FLAGS: None   WEIGHT BEARING RESTRICTIONS: No  FALLS:  Has patient fallen in last 6 months? 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  Get stronger and get back to being myself.  NEXT MD VISIT: In April  OBJECTIVE:  Note: Objective measures were completed at Evaluation unless otherwise noted.  DIAGNOSTIC FINDINGS:  na  PATIENT SURVEYS:  Modified Oswestry 11/50= 22% disability  LEFS 57/80= 71% (29% disability)  12/27/23  NDI: 14/50= 28%  01/11/24: Modified Oswestry Low Back Pain Disability Questionnaire: 22 / 50 = 44.0 % Lower Extremity Functional Score: 54 / 80 = 67.5 %  02/05/24 LEFS 54 / 80 = 67.5 % Modified Oswestry Low Back Pain Disability Questionnaire: 9 / 50 = 18.0 %  MUSCLE LENGTH: Hamstrings: Right approx 60 deg; Left approx 60 deg Thomas test: Right pos; Left pos  POSTURE:  Pronounced bilateral knee varus, neutral feet  PALPATION: Symmetry throughout shoulders, scapulae, pelvis, hips  CERVICAL ROM: All WNL with exception of rotation right which is WNL  but with significant pain  02/05/24 8/10 pain on L when turns to R  UPPER EXTREMITY:  LUMBAR ROM:   All WNL  LOWER EXTREMITY ROM:      WFL  LOWER EXTREMITY MMT:    Generally 4 to 4+/5  LUMBAR SPECIAL TESTS:  Slump test: Negative  FUNCTIONAL TESTS:  5 times sit to stand: 10.58 sec (01/11/24) Timed up and go (TUG): 8.52 sec (01/11/24)  02/05/24 TUG 9.12 sec  - knee biggest issue 5XSTS - 9.14 sec  GAIT: Distance walked: 50 feet Assistive device utilized: None Level of assistance: Complete Independence Comments: antalgic start up and gait   [facility] Adult PT Treatment:                                                DATE:  02/05/24 Rechecked goals; TUG, 5XSTS, MO and LEFS Nustep L5 x 6 min 30 sec Seated L UT stretch 2x30 sec Levator scap stretch 2 x 30" Cervical retraction x10 with suboccipital stretch Low trap lift off at wall x 10 Prone T and Y 2 x 10 B with pelvic press into table  to stabilize low back - head off corner of bed for stabilization Seated Lat pull 30# x 30 Seated scapular retraction with red band 2x10 Attempted er with band but activates L UT   [facility] Adult PT Treatment:                                                DATE:  01/11/24 Rechecked goals for auth/re-cert Child's pose x30" with lateral flexion x30" Quadruped cat/cow x10 Quadruped thread the needle x30" UT stretch x30" Levator scap stretch x 30" Cervical retraction x10 with suboccipital stretch Scap squeeze 2x10 Seated shoulder ER 2x10  12/27/23 (patient was late for appointment) Assessment for cervical spine completed Lengthy discussion about land based therapy vs aquatic therapy due to her multiple orthopedic issues, especially her knees.  She had previously stated that the water made everything feel better but stated today that she feels it makes her knees worse.  Educated patient on the benefit of hydrostatic pressure and  buoyancy that the water provides and how land based therapy would likely be less tolerable.  She is requesting to stick with land based therapy despite this information.   Initiated C spine HEP including ROM, upper  trap and levator stretches Educated on sleeping positions for optimal comfort for her neck, low back and knee issues.     12/18/23 Nustep L5 x 6 min 3 way SB stretch x 5 each direction Seated fig 4 2 x 30 sec ea Resisted walking 10# x 5 - 4 direction Self care: DN purpose, education and aftercare; discussion of knee cartilage and her knee dx Manual: Trigger Point Dry Needling  Initial Treatment: Pt instructed on Dry Needling rational, procedures, and possible side effects. Pt instructed to expect mild to moderate muscle soreness later in the day and/or into the next day.  Pt instructed in methods to reduce muscle soreness. Pt instructed to continue prescribed HEP. Patient was educated on signs and symptoms of infection and other risk factors and advised to seek medical attention should they occur.  Patient verbalized understanding of these instructions and education.   Patient Verbal Consent Given: Yes Education Handout Provided: Yes Muscles Treated: Left quads Electrical Stimulation Performed: No Treatment Response/Outcome: Utilized skilled palpation to identify bony landmarks and trigger points.  Able to illicit twitch response and muscle elongation.  Soft tissue mobilization to L quads  following to further promote tissue elongation and decreased pain.     12/01/23 Pt seen for aquatic therapy today.  Treatment took place in water 3.5-4.75 ft in depth at the [facility] pool. Temp of water was 91.  Pt entered/exited the pool via stairs independently with bilat rail. - intro to aquatic therapy priniciples - UE On barbell at 44ft, 6" depth:  walking next to wall forward/ backward multiple times;  side stepping R/L 70ft x 4 - UE On wall:  relaxed squat x 5  (pause in squat for back relief) ; L stretch x 10s x 2;  hip abdct 2 x 5; single leg clam x 5 each; relaxed squat  - plank with UE on bench with hip ext x 5 each LE - seated on lift chair: LAQ, cycling, hip abdct/ addct    Pt requires the buoyancy and hydrostatic pressure of water for support, and to offload joints by unweighting joint load by at least 50 % in  navel deep water and by at least 75-80% in chest to neck deep water.  Viscosity of the water is needed for resistance of strengthening. Water current perturbations provides challenge to standing balance requiring increased core activation.  11/15/23      Initial eval completed, educated on her knee posture and the effect of this on her knee and lower leg pain, encouraged patient to resume previous HEP.                                                                                                                               PATIENT EDUCATION:  Education details: educated on her knee posture and the effect of this on her knee and lower leg pain, symmetry and kinetic chain effects Person educated: Patient Education method: Medical illustrator Education comprehension: verbalized understanding  HOME EXERCISE PROGRAM: Access Code: [email or web address] URL: [email or web address] Date: 11/28/2023 Prepared by: Concha Deed  Exercises - Supine Lower Trunk Rotation  - 2 x daily - 7 x weekly - 1 sets - 10 reps - Seated Hamstring Stretch  - 2 x daily - 7 x weekly - 1 sets - 30 hold - Standing 'L' Stretch at Counter  - 2 x daily - 7 x weekly - 1 sets - 5 reps - Prone Quadriceps Stretch with Strap  - 2 x daily - 7 x weekly - 1 sets - 10 reps - Standing Hamstring Stretch on Chair  - 1 x daily - 7 x weekly - 3 sets - 10 reps - Quadricep Stretch with Chair and Counter Support  - 1 x daily - 7 x weekly - 3 sets - 10 reps - Supine Posterior Pelvic Tilt  - 1 x daily - 7 x weekly - 3 sets - 10 reps - Supine 90/90 Alternating Heel Touches with Posterior Pelvic Tilt  - 1 x daily - 7 x weekly - 3 sets - 10 reps - Supine Piriformis Stretch with Foot on Ground  - 1 x daily - 7 x weekly - 3 sets - 10 reps - Clamshell  - 1 x daily - 7 x weekly - 1 sets - 10 reps -  Sidelying Open Book Thoracic Rotation with Knee on Foam Roll  - 1 x daily - 7 x weekly - 1 sets - 10 reps - Seated Quadratus Lumborum Stretch in Chair  - 1 x daily - 7 x weekly - 1 sets - 10 reps - Standing Quadratus Lumborum Stretch with Doorway (Mirrored)  - 1 x daily - 7 x weekly - 1 sets - 10 reps - Hooklying Sequential Leg March and Lower  - 1 x daily - 7 x weekly - 2 sets - 10 reps - Prone Alternating Arm and Leg Lifts  - 1 x daily - 7 x weekly - 3 sets - 10 reps - 3-5 sec hold - Seated Piriformis Stretch with Trunk Bend  - 2 x daily - 7 x weekly - 1 sets -  3 reps - 30-60 sec  hold   Pool info provided  ASSESSMENT:  CLINICAL IMPRESSION: Patient presents after 3 week absence due to transportation issues. She is moving better overall, but continues to have pain in the low back, L neck and L knee. There is no change in her LEFS. She will be seeing Dr. Bernard Brick soon for her knee. She reports decreased pain in the low back and has met her Modified Oswestry goal showing improved function. She demonstrated increased time on the TUG today and decreased time on her 5XSTS. She has been doing cardio machines at [employer]. She continues to have increased L upper trap and levator tightness and PT recommended DN to pt. She wanted to wait until her next visit. I feel this will significantly decrease her neck pain with ROM. Due to her delay in returning to PT, she will require re-authorization next visit.    OBJECTIVE IMPAIRMENTS: decreased knowledge of condition, difficulty walking, decreased strength, increased fascial restrictions, increased muscle spasms, impaired flexibility, postural dysfunction, and pain.   ACTIVITY LIMITATIONS: carrying, lifting, bending, sitting, standing, squatting, sleeping, stairs, transfers, bed mobility, bathing, dressing, hygiene/grooming, and caring for others  PARTICIPATION LIMITATIONS: meal prep, cleaning, laundry, driving, shopping, community activity, occupation, and  yard work  PERSONAL FACTORS: Fitness, Profession, and Time since onset of injury/illness/exacerbation are also affecting patient's functional outcome.   REHAB POTENTIAL: Fair time since onset and poor attendance last episode  CLINICAL DECISION MAKING: Evolving/moderate complexity  EVALUATION COMPLEXITY: Moderate   GOALS: Goals reviewed with patient? Yes  SHORT TERM GOALS: Target date: 12/13/2023   Pain report to be no greater than 4/10  Baseline: Goal status: NOT MET  2.  Patient will be independent with initial HEP  Baseline:  Goal status: PARTIALLY MET   LONG TERM GOALS: Target date: 02/22/2024    Patient to report pain no greater than 2/10  Baseline:  Goal status: ONGOING 02/05/24  2.  Patient to be independent with advanced HEP  Baseline:  Goal status: PARTIALLY MET  3.  Patient to be able to return to work Baseline:  Goal status: IN PROGRESS -- supposed to return May 5  4.  Patient to be able to avoid surgery on knees Baseline:  Goal status: IN PROGRESS -- MRI and imaging waiting  5.  LEFS and Oswestry score to improve by 5% Baseline:  Goal status: PARTIALLY MET 4/28 FOR Modified Oswestry  6.  Functional tests to improve by 2-3 seconds Baseline: 5x STS 10.58 sec, TUG  Goal status: IN PROGRESS 02/05/24  7.  Full C spine ROM without pain  Baseline: pain with rotation right  Goal status: IN PROGRESS 02/05/24  8. Patient to be able to sleep through the night   Baseline:  01/11/24: sleeping for 1/2 the night (~5 hours)  Goal status: IN PROGRESS 02/05/24 4-5 hours; can sleep through the night with Tylenol  PM  PLAN:  PT FREQUENCY: 1-2x/week  PT DURATION: 6 weeks  PLANNED INTERVENTIONS: 97110-Therapeutic exercises, 97530- Therapeutic activity, 97112- Neuromuscular re-education, 97535- Self Care, [address]- Manual therapy, [id number]- Gait training, [phone]- Aquatic Therapy, 97014- Electrical stimulation (unattended), 97016- Vasopneumatic device, [id number]- Ultrasound, [id number]-  Traction (mechanical), [id number]- Ionotophoresis 4mg /ml Dexamethasone , Patient/Family education, Balance training, Stair training, Taping, Dry Needling, Joint mobilization, Spinal mobilization, Cryotherapy, and Moist heat.  PLAN FOR NEXT SESSION: Re-auth, DN to L UT and neck, continue with mid/low trap strength, work on core   [employer], PT  02/05/24 5:15 PM [facility] [address]  [address] [place], [place] [address] Phone # [phone] Fax [phone]

## 2024-02-06 ENCOUNTER — Ambulatory Visit: Admitting: Physical Medicine and Rehabilitation

## 2024-02-07 ENCOUNTER — Ambulatory Visit: Payer: Self-pay | Admitting: Physical Therapy

## 2024-02-07 ENCOUNTER — Encounter: Payer: Self-pay | Admitting: Physical Therapy

## 2024-02-07 DIAGNOSIS — M6281 Muscle weakness (generalized): Secondary | ICD-10-CM | POA: Diagnosis not present

## 2024-02-07 DIAGNOSIS — R293 Abnormal posture: Secondary | ICD-10-CM | POA: Diagnosis not present

## 2024-02-07 DIAGNOSIS — R252 Cramp and spasm: Secondary | ICD-10-CM

## 2024-02-07 DIAGNOSIS — G8929 Other chronic pain: Secondary | ICD-10-CM

## 2024-02-07 DIAGNOSIS — M542 Cervicalgia: Secondary | ICD-10-CM

## 2024-02-07 DIAGNOSIS — M25561 Pain in right knee: Secondary | ICD-10-CM | POA: Diagnosis not present

## 2024-02-07 DIAGNOSIS — R262 Difficulty in walking, not elsewhere classified: Secondary | ICD-10-CM | POA: Diagnosis not present

## 2024-02-07 DIAGNOSIS — M25562 Pain in left knee: Secondary | ICD-10-CM | POA: Diagnosis not present

## 2024-02-07 DIAGNOSIS — M5459 Other low back pain: Secondary | ICD-10-CM

## 2024-02-07 NOTE — Therapy (Addendum)
 OUTPATIENT PHYSICAL THERAPY THORACOLUMBAR AND CERVICAL  TREATMENT /DISCHARGE NOTE    Patient Name: Donna [name] MRN: [id number] DOB:[birth year], 53 y.o., female Today's Date: 02/07/2024  END OF SESSION:  PT End of Session - 02/07/24 1243     Visit Number 8    Date for PT Re-Evaluation 02/22/24    Authorization Type Medicaid  (more auth submitted 4/30 appt)    Authorization Time Period 4 vists approved 4/5 to 5/4 (more auth submitted 4/30 appt)    Authorization - Visit Number 2    Authorization - Number of Visits 4    Progress Note Due on Visit 10    PT Start Time 1102    PT Stop Time 1144    PT Time Calculation (min) 42 min    Activity Tolerance Patient tolerated treatment well    Behavior During Therapy [facility] for tasks assessed/performed                Past Medical History:  Diagnosis Date   Allergy    Anemia    Anxiety    Arthritis    Contraceptive management 04/21/2014   Environmental allergies 12/30/2014   Herpes    History of anemia 05/05/2016   History of herpes simplex infection 04/21/2014   Irregular bleeding 05/05/2015   Itching in the vaginal area 05/05/2016   Retained tampon 05/05/2015   Umbilical hernia 12/30/2014   Vaginal discharge 05/05/2015   Past Surgical History:  Procedure Laterality Date   BREAST BIOPSY Left 12/2021   CERVICAL CERCLAGE  2010 and 2004   [employer] cerclage   CESAREAN SECTION  10/10/2010   Women's   HERNIA REPAIR     umbilical   UMBILICAL HERNIA REPAIR  01/30/2012   Procedure: HERNIA REPAIR UMBILICAL ADULT;  Surgeon: Lovena Rubinstein, MD;  Location: [facility];  Service: General;  Laterality: N/A;  With mesh   Patient Active Problem List   Diagnosis Date Noted   Right knee pain 04/24/2023   Left knee pain 04/24/2023   Back pain 03/10/2023   Acute vaginitis 04/21/2022   Encounter for annual general medical examination with abnormal findings in adult 01/21/2022   Atopic dermatitis in adult 01/21/2022   Seasonal  allergies 01/21/2022   Caregiver stress 01/21/2022   Depression, major, single episode, mild (HCC) 01/21/2022   Trichomoniasis 10/15/2019   Vaginal odor 10/09/2019   Vaginal bleeding 10/09/2019   Screening examination for STD (sexually transmitted disease) 10/09/2019   Elevated BP without diagnosis of hypertension 10/09/2019   Iron deficiency anemia due to chronic blood loss 10/09/2019   Uterine fibroids 09/11/2017   Screening for colorectal cancer 06/27/2017   History of anemia 05/05/2016   Irregular bleeding 05/05/2015   Retained tampon 05/05/2015   Umbilical hernia 12/30/2014   Environmental allergies 12/30/2014   History of herpes simplex infection 04/21/2014    PCP: Zarwolo, Gloria, FNP   REFERRING PROVIDER: Zarwolo, Gloria, FNP   REFERRING DIAG: M54.50,G89.29 (ICD-10-CM) - Chronic low back pain, unspecified back pain laterality, unspecified whether sciatica present M62.81 (ICD-10-CM) - Muscle weakness (generalized) R29.3 (ICD-10-CM) - Abnormal posture, Cervicalgia  Rationale for Evaluation and Treatment: Rehabilitation  THERAPY DIAG:  Other low back pain  Cervicalgia  Left knee pain, unspecified chronicity  Chronic pain of right knee  Difficulty in walking, not elsewhere classified  Muscle weakness (generalized)  Cramp and spasm  Abnormal posture  ONSET DATE: 11/05/2023  SUBJECTIVE:  SUBJECTIVE STATEMENT: Patient reports she is doing okay today. Still having low back pain, less neck pain and knee pain. She has been trying to be more active by going to [employer].  PERTINENT HISTORY:  Very active prior to first accident  PAIN:  02/07/24 Are you having pain? 6/10 general pain Neck pain 9/10 Yes: NPRS scale: 5/10 B  low back, 7/10 knees, 5/10 neck  Pain location: see  above Pain description: burning, aching Aggravating factors: walking, standing, start up Relieving factors: biofreeze, meds, ice/heat  PRECAUTIONS: None  RED FLAGS: None   WEIGHT BEARING RESTRICTIONS: No  FALLS:  Has patient fallen in last 6 months? 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  Get stronger and get back to being myself.  NEXT MD VISIT: In April  OBJECTIVE:  Note: Objective measures were completed at Evaluation unless otherwise noted.  DIAGNOSTIC FINDINGS:  na  PATIENT SURVEYS:  Modified Oswestry 11/50= 22% disability  LEFS 57/80= 71% (29% disability)  12/27/23  NDI: 14/50= 28%  01/11/24: Modified Oswestry Low Back Pain Disability Questionnaire: 22 / 50 = 44.0 % Lower Extremity Functional Score: 54 / 80 = 67.5 %  02/05/24 LEFS 54 / 80 = 67.5 % Modified Oswestry Low Back Pain Disability Questionnaire: 9 / 50 = 18.0 %  MUSCLE LENGTH: Hamstrings: Right approx 60 deg; Left approx 60 deg Thomas test: Right pos; Left pos  POSTURE: Pronounced bilateral knee varus, neutral feet  PALPATION: Symmetry throughout shoulders, scapulae, pelvis, hips  CERVICAL ROM: All WNL with exception of rotation right which is WNL  but with significant pain  02/05/24 8/10 pain on L when turns to R  UPPER EXTREMITY:  LUMBAR ROM:   All WNL  LOWER EXTREMITY ROM:     WFL  LOWER EXTREMITY MMT:    Generally 4 to 4+/5  LUMBAR SPECIAL TESTS:   Slump test: Negative  FUNCTIONAL TESTS:  5 times sit to stand: 10.58 sec (01/11/24) Timed up and go (TUG): 8.52 sec (01/11/24)  02/05/24 TUG 9.12 sec  - knee biggest issue 5XSTS - 9.14 sec  GAIT: Distance walked: 50 feet Assistive device utilized: None Level of assistance: Complete Independence Comments: antalgic start up and gait   02/07/24 Nustep L5 x 7 min 30 sec 3 way stability ball stretch x 10 each direction 5 second hold Seated L UT stretch 2x30 sec bilateral  Levator scap stretch 2 x 30" bilateral  Low trap lift off at wall x 10 TA activation x 20(added to HEP) TA activation + bent knee fall out x 10 bilateral (added to HEP)  Trigger Point Dry Needling  Subsequent Treatment: Instructions provided previously at initial dry needling treatment.   Patient Verbal Consent Given: Yes Education Handout Provided: Previously Provided Muscles Treated: bilateral upper traps Electrical Stimulation Performed: No Treatment Response/Outcome: Utilized skilled palpation to identify trigger points.  During dry needling able to palpate muscle twitch and muscle elongation   Manual STM after dry needling     [facility] Adult PT Treatment:  DATE:  02/05/24 Rechecked goals; TUG, 5XSTS, MO and LEFS Nustep L5 x 6 min 30 sec Seated L UT stretch 2x30 sec Levator scap stretch 2 x 30" Cervical retraction x10 with suboccipital stretch Low trap lift off at wall x 10 Prone T and Y 2 x 10 B with pelvic press into table to stabilize low back - head off corner of bed for stabilization Seated Lat pull 30# x 30 Seated scapular retraction with red band 2x10 Attempted er with band but activates L UT   [facility] Adult PT Treatment:                                                DATE:  01/11/24 Rechecked goals for auth/re-cert Child's pose x30" with lateral flexion x30" Quadruped cat/cow x10 Quadruped thread the needle x30" UT stretch x30" Levator scap stretch  x 30" Cervical retraction x10 with suboccipital stretch Scap squeeze 2x10 Seated shoulder ER 2x10  12/27/23 (patient was late for appointment) Assessment for cervical spine completed Lengthy discussion about land based therapy vs aquatic therapy due to her multiple orthopedic issues, especially her knees.  She had previously stated that the water made everything feel better but stated today that she feels it makes her knees worse.  Educated patient on the benefit of hydrostatic pressure and  buoyancy that the water provides and how land based therapy would likely be less tolerable.  She is requesting to stick with land based therapy despite this information.   Initiated C spine HEP including ROM, upper trap and levator stretches Educated on sleeping positions for optimal comfort for her neck, low back and knee issues.                      PATIENT EDUCATION:  Education details: educated on her knee posture and the effect of this on her knee and lower leg pain, symmetry and kinetic chain effects Person educated: Patient Education method: Medical illustrator Education comprehension: verbalized understanding  HOME EXERCISE PROGRAM: Access Code: [email or web address] URL: [email or web address] Date: 02/07/2024 Prepared by: Penelope Bowie  Exercises - Supine Lower Trunk Rotation  - 2 x daily - 7 x weekly - 1 sets - 10 reps - Standing 'L' Stretch at Counter  - 2 x daily - 7 x weekly - 1 sets - 5 reps - Standing Hamstring Stretch on Chair  - 1 x daily - 7 x weekly - 3 sets - 10 reps - Theatre manager with Chair and Counter Support  - 1 x daily - 7 x weekly - 3 sets - 10 reps - Supine 90/90 Alternating Heel Touches with Posterior Pelvic Tilt  - 1 x daily - 7 x weekly - 3 sets - 10 reps - Supine Piriformis Stretch with Foot on Ground  - 1 x daily - 7 x weekly - 3 sets - 10 reps - Clamshell  - 1 x daily - 7 x weekly - 1 sets - 10 reps - Sidelying Open Book Thoracic Rotation with Knee  on Foam Roll  - 1 x daily - 7 x weekly - 1 sets - 10 reps - Standing Quadratus Lumborum Stretch with Doorway (Mirrored)  - 1 x daily - 7 x weekly - 1 sets - 10 reps - Hooklying Sequential Leg March and Lower  - 1 x daily - 7 x weekly -  2 sets - 10 reps - Prone Alternating Arm and Leg Lifts  - 1 x daily - 7 x weekly - 3 sets - 10 reps - 3-5 sec hold - Seated Upper Trapezius Stretch  - 1 x daily - 7 x weekly - 1 sets - 10 reps - 5 hold - Gentle Levator Scapulae Stretch  - 1 x daily - 7 x weekly - 2 sets - 30 sec hold - Seated Scapular Retraction  - 1 x daily - 7 x weekly - 2 sets - 10 reps - Standing Cervical Retraction  - 1 x daily - 7 x weekly - 2 sets - 10 reps - Shoulder External Rotation and Scapular Retraction  - 1 x daily - 7 x weekly - 2 sets - 10 reps - Supine Transversus Abdominis Bracing - Hands on Stomach  - 1 x daily - 7 x weekly - 2 sets - 10 reps - Bent Knee Fallouts  - 1 x daily - 7 x weekly - 2 sets - 10 reps   Pool info provided  ASSESSMENT:  CLINICAL IMPRESSION: Felicite presents to therapy with 6/10 pain in her bilateral knee and neck. Incorporated TA activation exercises and patient required max verbal and tactile cues for correct performance. Updated HEP to include these exercises. Patient responded favorably to dry needling multiple muscle twitches were felt in bilateral upper traps. Educated patient to stay hydrated and for neck stretches today and tomorrow. Overall, patient tolerated treatment session well. Patient will benefit from skilled PT to address the below impairments and improve overall function.     OBJECTIVE IMPAIRMENTS: decreased knowledge of condition, difficulty walking, decreased strength, increased fascial restrictions, increased muscle spasms, impaired flexibility, postural dysfunction, and pain.   ACTIVITY LIMITATIONS: carrying, lifting, bending, sitting, standing, squatting, sleeping, stairs, transfers, bed mobility, bathing, dressing,  hygiene/grooming, and caring for others  PARTICIPATION LIMITATIONS: meal prep, cleaning, laundry, driving, shopping, community activity, occupation, and yard work  PERSONAL FACTORS: Fitness, Profession, and Time since onset of injury/illness/exacerbation are also affecting patient's functional outcome.   REHAB POTENTIAL: Fair time since onset and poor attendance last episode  CLINICAL DECISION MAKING: Evolving/moderate complexity  EVALUATION COMPLEXITY: Moderate   GOALS: Goals reviewed with patient? Yes  SHORT TERM GOALS: Target date: 12/13/2023   Pain report to be no greater than 4/10  Baseline: Goal status: NOT MET  2.  Patient will be independent with initial HEP  Baseline:  Goal status: PARTIALLY MET   LONG TERM GOALS: Target date: 02/22/2024    Patient to report pain no greater than 2/10  Baseline:  Goal status: ONGOING 02/07/24  2.  Patient to be independent with advanced HEP  Baseline:  Goal status: PARTIALLY MET 02/07/2024  3.  Patient to be able to return to work Baseline:  Goal status: NOT MET 02/15/2024  4.  Patient to be able to avoid surgery on knees Baseline:  Goal status: IN PROGRESS 02/07/2024-- MRI and imaging waiting  5.  LEFS and Oswestry score to improve by 5% Baseline:  Goal status: PARTIALLY MET 02/07/2024 FOR Modified Oswestry  6.  Functional tests to improve by 2-3 seconds Baseline: 5x STS 10.58 sec, TUG  Goal status: NOT MET 02/15/2024  7.  Full C spine ROM without pain  Baseline: pain with rotation right  Goal status: PARTIALLY MET 02/15/2024  8. Patient to be able to sleep through the night   Baseline:  01/11/24: sleeping for 1/2 the night (~5 hours)  Goal status: NOT MET 02/15/2024  PLAN:  PT FREQUENCY: 1-2x/week  PT DURATION: 6 weeks  PLANNED INTERVENTIONS: 97110-Therapeutic exercises, 97530- Therapeutic activity, [id number]- Neuromuscular re-education, 97535- Self Care, 16109- Manual therapy, [id number]- Gait training, [phone]- Aquatic Therapy,  97014- Electrical stimulation (unattended), 97016- Vasopneumatic device, [id number]- Ultrasound, [id number]- Traction (mechanical), [id number]- Ionotophoresis 4mg /ml Dexamethasone , Patient/Family education, Balance training, Stair training, Taping, Dry Needling, Joint mobilization, Spinal mobilization, Cryotherapy, and Moist heat.  PLAN FOR NEXT SESSION: assess updated HEP; continue core & postural strengthening ; manual/ DN as indicated   Penelope Bowie, PT 02/07/24 1:21 PM [facility] [address] [place], [place] [address] Phone # [phone] Fax [phone]  PHYSICAL THERAPY DISCHARGE SUMMARY  Visits from Start of Care: 8  Current functional level related to goals / functional outcomes: Nesa has made some improvements with physical therapy. She started going to the gym for improved overall health and this has helped her pain. Physical therapy is ending due to insurance denial. Denial stated that she has been majority of her goals at this item and the remaining goals do not relate to a functional task. Patient was made aware of this and she agrees with the discharge.   Remaining deficits: Knee pain; decreased standing and walking tolerance   Education / Equipment: See above   Patient agrees to discharge. Patient goals were partially met. Patient is being discharged due to Improved function and insurance denial of coverage.   Penelope Bowie, PT 02/15/24 10:49 AM

## 2024-02-16 ENCOUNTER — Ambulatory Visit

## 2024-02-19 ENCOUNTER — Ambulatory Visit: Admitting: Physical Medicine and Rehabilitation

## 2024-02-23 ENCOUNTER — Ambulatory Visit: Admitting: Rehabilitative and Restorative Service Providers"

## 2024-02-26 ENCOUNTER — Ambulatory Visit (HOSPITAL_COMMUNITY)

## 2024-02-26 ENCOUNTER — Other Ambulatory Visit (HOSPITAL_COMMUNITY): Payer: Self-pay

## 2024-02-29 ENCOUNTER — Encounter: Payer: Self-pay | Admitting: Emergency Medicine

## 2024-02-29 ENCOUNTER — Ambulatory Visit
Admission: RE | Admit: 2024-02-29 | Discharge: 2024-02-29 | Disposition: A | Discharge status: Home / Self Care | Source: Ambulatory Visit

## 2024-02-29 ENCOUNTER — Ambulatory Visit
Admission: RE | Admit: 2024-02-29 | Discharge: 2024-02-29 | Disposition: A | Discharge status: Home / Self Care | Source: Ambulatory Visit | Attending: Nurse Practitioner | Admitting: Nurse Practitioner

## 2024-02-29 ENCOUNTER — Ambulatory Visit (HOSPITAL_COMMUNITY)

## 2024-02-29 DIAGNOSIS — M25562 Pain in left knee: Secondary | ICD-10-CM | POA: Diagnosis not present

## 2024-02-29 MED ORDER — DICLOFENAC SODIUM 1 % EX GEL: 4.0000 g | Freq: Four times a day (QID) | CUTANEOUS | 0 refills | Status: DC

## 2024-02-29 MED ORDER — TRAMADOL HCL 50 MG PO TABS: 50.0000 mg | ORAL_TABLET | Freq: Four times a day (QID) | ORAL | 0 refills | Status: DC | PRN

## 2024-02-29 MED ORDER — NAPROXEN 500 MG PO TABS
500.0000 mg | ORAL_TABLET | Freq: Two times a day (BID) | ORAL | 0 refills | Status: DC
  Filled 2024-02-29: qty 20, 10d supply, fill #0

## 2024-02-29 MED ORDER — PREDNISONE 20 MG PO TABS
40.0000 mg | ORAL_TABLET | Freq: Every day | ORAL | 0 refills | Status: DC
  Filled 2024-02-29: qty 10, 5d supply, fill #0

## 2024-02-29 MED ORDER — NAPROXEN 500 MG PO TABS: 500.0000 mg | ORAL_TABLET | Freq: Two times a day (BID) | ORAL | 0 refills | Status: DC
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
## 2024-02-29 NOTE — ED Triage Notes (Signed)
 Pt presents c/o left knee pain x 1 year. Pt states she believes the pain is arthritis pain. She denies injury and/or fall. Pt states she has an appt with ortho on 03/12/2024. Pt normally get cortisone inj but won't be able to get until she sees ortho provider.

## 2024-02-29 NOTE — Discharge Instructions (Addendum)
 You were seen today for knee pain. Please stop taking all oral pain medications you've previously been using. Begin taking the prescribed naproxen  twice a day with food to help reduce inflammation and manage discomfort. For more severe pain, you may use the prescribed tramadol  as needed. If additional pain relief is necessary, you may take Tylenol  (acetaminophen ) 1000 mg every six hours, which is equal to two 500 mg tablets at a time. Be careful not to exceed 4000 mg of Tylenol  in a 24-hour period.  You should also apply the prescription diclofenac  gel to your knee four times a day as directed. In addition to medications, it's important to follow RICE therapy: rest your knee as much as possible, apply ice for about 20 minutes at a time several times a day, and keep your leg elevated when possible. Always place a towel or cloth between the ice and your skin to avoid injury, and stop using the ice if your skin becomes very red or you lose the sensation of cold.  You may continue using your knee brace to help support the joint and reduce swelling. If your toes begin to tingle, feel numb, or turn cold or blue, loosen the brace immediately. The brace can be removed during sleep or when showering.  Keep your leg elevated by placing a pillow under your knee whenever possible to help decrease swelling. Be sure to follow up with orthopedics on June 3rd as scheduled.

## 2024-03-01 ENCOUNTER — Other Ambulatory Visit (HOSPITAL_COMMUNITY): Payer: Self-pay

## 2024-03-06 ENCOUNTER — Other Ambulatory Visit (HOSPITAL_COMMUNITY): Payer: Self-pay

## 2024-05-02 ENCOUNTER — Encounter: Admitting: Obstetrics and Gynecology

## 2024-05-06 ENCOUNTER — Ambulatory Visit: Payer: Self-pay | Admitting: Podiatry

## 2024-05-06 ENCOUNTER — Ambulatory Visit: Admitting: Family Medicine

## 2024-06-05 ENCOUNTER — Ambulatory Visit (INDEPENDENT_AMBULATORY_CARE_PROVIDER_SITE_OTHER): Admitting: Podiatry

## 2024-06-05 DIAGNOSIS — Z91199 Patient's noncompliance with other medical treatment and regimen due to unspecified reason: Secondary | ICD-10-CM

## 2024-06-05 NOTE — Progress Notes (Signed)
 No show

## 2024-06-10 ENCOUNTER — Ambulatory Visit (HOSPITAL_COMMUNITY)

## 2024-06-11 ENCOUNTER — Ambulatory Visit (HOSPITAL_COMMUNITY)

## 2024-06-21 ENCOUNTER — Inpatient Hospital Stay: Admission: RE | Admit: 2024-06-21 | Source: Ambulatory Visit

## 2024-06-22 ENCOUNTER — Ambulatory Visit

## 2024-07-02 ENCOUNTER — Ambulatory Visit: Admitting: Podiatry

## 2024-07-11 ENCOUNTER — Ambulatory Visit (HOSPITAL_COMMUNITY)

## 2024-07-18 ENCOUNTER — Encounter: Admitting: Obstetrics and Gynecology

## 2024-07-22 ENCOUNTER — Other Ambulatory Visit: Payer: Self-pay | Admitting: Family Medicine

## 2024-07-22 DIAGNOSIS — Z1231 Encounter for screening mammogram for malignant neoplasm of breast: Secondary | ICD-10-CM

## 2024-07-30 ENCOUNTER — Ambulatory Visit: Admitting: Podiatry

## 2024-07-30 DIAGNOSIS — Z91199 Patient's noncompliance with other medical treatment and regimen due to unspecified reason: Secondary | ICD-10-CM

## 2024-07-30 NOTE — Progress Notes (Signed)
 No show

## 2024-07-31 ENCOUNTER — Ambulatory Visit
Admission: RE | Admit: 2024-07-31 | Discharge: 2024-07-31 | Disposition: A | Discharge status: Home / Self Care | Source: Ambulatory Visit

## 2024-07-31 ENCOUNTER — Ambulatory Visit (HOSPITAL_COMMUNITY)

## 2024-07-31 DIAGNOSIS — Z1231 Encounter for screening mammogram for malignant neoplasm of breast: Secondary | ICD-10-CM

## 2024-08-06 ENCOUNTER — Ambulatory Visit (HOSPITAL_COMMUNITY)

## 2024-08-07 DIAGNOSIS — G8929 Other chronic pain: Secondary | ICD-10-CM | POA: Diagnosis not present

## 2024-08-07 DIAGNOSIS — M170 Bilateral primary osteoarthritis of knee: Secondary | ICD-10-CM | POA: Diagnosis not present

## 2024-08-12 ENCOUNTER — Encounter: Payer: Self-pay | Admitting: Radiology

## 2024-08-15 ENCOUNTER — Ambulatory Visit

## 2024-08-16 ENCOUNTER — Ambulatory Visit (HOSPITAL_COMMUNITY)

## 2024-08-16 ENCOUNTER — Ambulatory Visit (HOSPITAL_COMMUNITY)
Admission: RE | Admit: 2024-08-16 | Discharge: 2024-08-16 | Disposition: A | Discharge status: Home / Self Care | Source: Ambulatory Visit | Attending: Emergency Medicine | Admitting: Emergency Medicine

## 2024-08-16 ENCOUNTER — Encounter (HOSPITAL_COMMUNITY): Payer: Self-pay

## 2024-08-16 VITALS — BP 135/83 | HR 68 | Temp 98.0°F | Resp 16

## 2024-08-16 DIAGNOSIS — M79641 Pain in right hand: Secondary | ICD-10-CM

## 2024-08-16 DIAGNOSIS — M7989 Other specified soft tissue disorders: Secondary | ICD-10-CM | POA: Diagnosis not present

## 2024-08-16 MED ORDER — PREDNISONE 20 MG PO TABS: 40.0000 mg | ORAL_TABLET | Freq: Every day | ORAL | 0 refills | Status: DC

## 2024-08-16 NOTE — ED Provider Notes (Signed)
 [facility]    CSN: [id number] Arrival date & time: 08/16/24  1820      History   Chief Complaint Chief Complaint  Patient presents with   Hand Problem    Swelling hurt  very bad - Entered by patient    HPI LAIKYNN POLLIO is a 53 y.o. female.   Patient presents with right posterior hand pain and swelling that began about 2 days ago.  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  Patient states that swelling has been more significant than it is today.  Patient denies taking any medication for the pain and swelling.  Patient denies any previous history of issues with this hand.  Patient denies any known injuries or recent falls.  The history is provided by the patient and medical records.    Past Medical History:  Diagnosis Date   Allergy    Anemia    Anxiety    Arthritis    Contraceptive management 04/21/2014   Environmental allergies 12/30/2014   Herpes    History of anemia 05/05/2016   History of herpes simplex infection 04/21/2014   Irregular bleeding 05/05/2015   Itching in the vaginal area 05/05/2016   Retained tampon 05/05/2015   Umbilical hernia 12/30/2014   Vaginal discharge 05/05/2015    Patient Active Problem List   Diagnosis Date Noted   Right knee pain 04/24/2023   Left knee pain 04/24/2023   Back pain 03/10/2023   Acute vaginitis 04/21/2022   Encounter for annual general medical examination with abnormal findings in adult 01/21/2022   Atopic dermatitis in adult 01/21/2022   Seasonal allergies 01/21/2022   Caregiver stress 01/21/2022   Depression, major, single episode, 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   Trichomoniasis 10/15/2019   Vaginal odor 10/09/2019   Vaginal bleeding 10/09/2019   Screening examination for STD (sexually transmitted disease) 10/09/2019   Elevated BP without diagnosis of hypertension 10/09/2019   Iron deficiency anemia  due to chronic blood loss 10/09/2019   Uterine fibroids 09/11/2017   Screening for colorectal cancer 06/27/2017   History of anemia 05/05/2016   Irregular bleeding 05/05/2015   Retained tampon 05/05/2015   Umbilical hernia 12/30/2014   Environmental allergies 12/30/2014   History of herpes simplex infection 04/21/2014    Past Surgical History:  Procedure Laterality Date   BREAST BIOPSY Left 12/2021   CERVICAL CERCLAGE  2010 and 2004   [employer] cerclage   CESAREAN SECTION  10/10/2010   Women[facility]   HERNIA REPAIR     umbilical   UMBILICAL HERNIA REPAIR  01/30/2012   Procedure: HERNIA REPAIR UMBILICAL ADULT;  Surgeon: Thresa JAYSON Pulling, MD;  Location: [facility];  Service: General;  Laterality: N/A;  With mesh    OB History     Gravida  5   Para  3   Term      Preterm      AB  2   Living  3      SAB  2   IAB      Ectopic      Multiple      Live Births               Home Medications    Prior to Admission medications   Medication Sig Start Date End Date Taking? Authorizing Provider  predniSONE  (DELTASONE ) 20 MG tablet Take 2 tablets (40  mg total) by mouth daily for 5 days. 08/16/24 08/21/24 Yes Yonah Tangeman A, NP  diclofenac  Sodium (VOLTAREN ) 1 % GEL Apply 4 g topically 4 (four) times daily. Apply to knee 02/29/24   Iola Lukes, FNP  fluticasone [facility]) 50 MCG/ACT nasal spray Place 1 spray into both nostrils daily as needed for allergies.    [provider]  lidocaine  (LIDODERM ) 5 % Place 1 patch onto the skin daily. Remove & Discard patch within 12 hours or as directed by MD 02/27/23   Suellen Cantor A, PA-C  naproxen  (NAPROSYN ) 500 MG tablet Take 1 tablet (500 mg total) by mouth 2 (two) times daily with a meal. 02/29/24   Iola Lukes, FNP  PARoxetine  (PAXIL ) 10 MG tablet Take 1 tablet (10 mg total) by mouth daily. 11/28/23   Jaycee Greig PARAS, NP  PARoxetine  Mesylate 7.5 MG CAPS Take 7.5 mg by mouth at bedtime. 10/27/23   Jaycee Greig PARAS, NP   traMADol  (ULTRAM ) 50 MG tablet Take 1 tablet (50 mg total) by mouth every 6 (six) hours as needed for severe pain (pain score 7-10). 02/29/24   Iola Lukes, FNP  Vitamin D , Ergocalciferol , (DRISDOL ) 1.25 MG (50000 UNIT) CAPS capsule Take 1 capsule (50,000 Units total) by mouth every 7 (seven) days. 08/01/23   Bacchus, Meade PEDLAR, FNP  cetirizine  (ZYRTEC ) 10 MG tablet Take 1 tablet (10 mg total) by mouth daily as needed for allergies. 09/18/20 02/02/21  Signa Delon LABOR, NP    Family History Family History  Problem Relation Age of Onset   Diabetes Mother    Hypertension Mother    Diabetes Father    Hypertension Father    Multiple sclerosis Father    Cancer Maternal Grandfather        prostate   Asthma Daughter    Asthma Daughter    Anesthesia problems Neg Hx    Hypotension Neg Hx    Malignant hyperthermia Neg Hx    Pseudochol deficiency Neg Hx    Colon cancer Neg Hx    Esophageal cancer Neg Hx    Stomach cancer Neg Hx    Rectal cancer Neg Hx     Social History Social History   Tobacco Use   Smoking status: Never    Passive exposure: Never   Smokeless tobacco: Never  Vaping Use   Vaping status: Never Used  Substance Use Topics   Alcohol use: Yes    Comment: special occasions   Drug use: Never     Allergies   Sulfa antibiotics   Review of Systems Review of Systems  Per HPI  Physical Exam Triage Vital Signs ED Triage Vitals  Encounter Vitals Group     BP 08/16/24 1833 135/83     Girls Systolic BP Percentile --      Girls Diastolic BP Percentile --      Boys Systolic BP Percentile --      Boys Diastolic BP Percentile --      Pulse Rate 08/16/24 1833 68     Resp 08/16/24 1833 16     Temp 08/16/24 1833 98 F (36.7 C)     Temp Source 08/16/24 1833 Oral     SpO2 08/16/24 1833 98 %     Weight --      Height --      Head Circumference --      Peak Flow --      Pain Score 08/16/24 1831 10     Pain Loc --  Pain Education --      Exclude from  Growth Chart --    No data found.  Updated Vital Signs BP 135/83 (BP Location: Left Arm)   Pulse 68   Temp 98 F (36.7 C) (Oral)   Resp 16   LMP 11/18/2021 (Approximate)   SpO2 98%   Visual Acuity Right Eye Distance:   Left Eye Distance:   Bilateral Distance:    Right Eye Near:   Left Eye Near:    Bilateral Near:     Physical Exam Vitals and nursing note reviewed.  Constitutional:      General: She is awake. She is not in acute distress.    Appearance: Normal appearance. She is well-developed and well-groomed. She is not ill-appearing.  Musculoskeletal:     Right hand: Swelling and tenderness present. No deformity or lacerations. Normal range of motion. Normal strength. Normal sensation. There is no disruption of two-point discrimination. Normal capillary refill. Normal pulse.     Comments: Mild swelling and tenderness noted to posterior right hand  Skin:    General: Skin is warm and dry.  Neurological:     Mental Status: She is alert.  Psychiatric:        Behavior: Behavior is cooperative.      UC Treatments / Results  Labs (all labs ordered are listed, but only abnormal results are displayed) Labs Reviewed - No data to display  EKG   Radiology No results found.  Procedures Procedures (including critical care time)  Medications Ordered in UC Medications - No data to display  Initial Impression / Assessment and Plan / UC Course  I have reviewed the triage vital signs and the nursing notes.  Pertinent labs & imaging results that were available during my care of the patient were reviewed by me and considered in my medical decision making (see chart for details).     Patient is overall well-appearing.  Vitals are stable.  X-ray ordered to rule out any underlying injury.  I independently interpreted these images and there is no acute osseous abnormality.  Suspect pain and inflammation could be related to underlying tendinitis.  Prescribed short prednisone   burst for this.  Provided patient with Ace wrap to provide compression to this area.  Recommended applying ice to the area as well.  Recommended taking Tylenol  as needed for additional pain relief.  Given orthopedic follow-up if needed.  Discussed follow-up and return precautions. Final Clinical Impressions(s) / UC Diagnoses   Final diagnoses:  Right hand pain     Discharge Instructions      Your x-ray is negative for any underlying fracture or dislocation. I believe your symptoms could likely be related to some tendinitis. Start taking 2 tablets of prednisone  once daily for 5 days for relief of this. You can wear the Ace wrap that we provided for you today to help with swelling and provide compression. You can take 500 to 1000 mg of Tylenol  every 6-8 hours as needed for pain. You can also apply ice to help with swelling. Follow-up with [facility] for further evaluation if your pain swelling continues. Otherwise follow-up with your primary care provider or return here as needed.   ED Prescriptions     Medication Sig Dispense Auth. Provider   predniSONE  (DELTASONE ) 20 MG tablet Take 2 tablets (40 mg total) by mouth daily for 5 days. 10 tablet Johnie Flaming A, NP      PDMP not reviewed this encounter.   Johnie,  Olive Motyka A, NP 08/16/24 1951

## 2024-08-16 NOTE — ED Triage Notes (Signed)
 Pt states right hand pain and swelling for the past 2 days. 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  Right hand sl swollen.

## 2024-08-16 NOTE — Discharge Instructions (Addendum)
 Your x-ray is negative for any underlying fracture or dislocation. I believe your symptoms could likely be related to some tendinitis. Start taking 2 tablets of prednisone  once daily for 5 days for relief of this. You can wear the Ace wrap that we provided for you today to help with swelling and provide compression. You can take 500 to 1000 mg of Tylenol  every 6-8 hours as needed for pain. You can also apply ice to help with swelling. Follow-up with [facility] for further evaluation if your pain swelling continues. Otherwise follow-up with your primary care provider or return here as needed.

## 2024-08-18 ENCOUNTER — Ambulatory Visit (HOSPITAL_COMMUNITY)

## 2024-08-19 ENCOUNTER — Encounter (HOSPITAL_COMMUNITY): Payer: Self-pay

## 2024-08-19 ENCOUNTER — Ambulatory Visit (HOSPITAL_COMMUNITY)
Admission: RE | Admit: 2024-08-19 | Discharge: 2024-08-19 | Disposition: A | Discharge status: Home / Self Care | Source: Ambulatory Visit | Attending: Physician Assistant | Admitting: Physician Assistant

## 2024-08-19 VITALS — BP 138/88 | HR 70 | Temp 97.9°F | Resp 18

## 2024-08-19 DIAGNOSIS — M79641 Pain in right hand: Secondary | ICD-10-CM

## 2024-08-19 DIAGNOSIS — S6391XD Sprain of unspecified part of right wrist and hand, subsequent encounter: Secondary | ICD-10-CM | POA: Diagnosis not present

## 2024-08-19 MED ORDER — MELOXICAM 15 MG PO TABS: 15.0000 mg | ORAL_TABLET | Freq: Every day | ORAL | 0 refills | Status: AC

## 2024-08-19 NOTE — ED Triage Notes (Signed)
 Pt states here on Friday d/t swelling to rt hand. States has one more dose of prednisone . States it getting better but still hurting and needs work note extension.

## 2024-08-19 NOTE — ED Provider Notes (Addendum)
 [facility]    CSN: [id number] Arrival date & time: 08/19/24  1543      History   Chief Complaint Chief Complaint  Patient presents with   Hand Problem    Follow up and need an extra day in a lot of pain still. And I just want to make sure the medicine doesn't have any sideerfecrs  on me - Entered by patient   Appt- hand pain    HPI Donna [name] is a 52 y.o. female.   Patient presents today for evaluation of right hand pain.  She denies any known injury or trauma before symptoms began but she did start a new job and wonders if she could have injured it while opening boxes several days ago.  She was seen by our clinic on 08/16/2024 at which point x-ray was obtained that showed no acute osseous abnormality and she was started on prednisone  burst.  She reports that her symptoms have improved and pain is currently rated 3 on a 0-10 pain scale but she continues to have some discomfort and so is requesting an extended work note and potential brace.  She has not been taking additional medications for symptom management.  She is right-handed.  Denies any numbness, paresthesias, fever, nausea, vomiting.  She denies previous injury or surgery involving her hands.  She does have a history of osteoarthritis but denies history of rheumatoid arthritis.    Past Medical History:  Diagnosis Date   Allergy    Anemia    Anxiety    Arthritis    Contraceptive management 04/21/2014   Environmental allergies 12/30/2014   Herpes    History of anemia 05/05/2016   History of herpes simplex infection 04/21/2014   Irregular bleeding 05/05/2015   Itching in the vaginal area 05/05/2016   Retained tampon 05/05/2015   Umbilical hernia 12/30/2014   Vaginal discharge 05/05/2015    Patient Active Problem List   Diagnosis Date Noted   Right knee pain 04/24/2023   Left knee pain 04/24/2023   Back pain 03/10/2023   Acute vaginitis 04/21/2022   Encounter for annual general medical examination  with abnormal findings in adult 01/21/2022   Atopic dermatitis in adult 01/21/2022   Seasonal allergies 01/21/2022   Caregiver stress 01/21/2022   Depression, major, single episode, 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   Trichomoniasis 10/15/2019   Vaginal odor 10/09/2019   Vaginal bleeding 10/09/2019   Screening examination for STD (sexually transmitted disease) 10/09/2019   Elevated BP without diagnosis of hypertension 10/09/2019   Iron deficiency anemia due to chronic blood loss 10/09/2019   Uterine fibroids 09/11/2017   Screening for colorectal cancer 06/27/2017   History of anemia 05/05/2016   Irregular bleeding 05/05/2015   Retained tampon 05/05/2015   Umbilical hernia 12/30/2014   Environmental allergies 12/30/2014   History of herpes simplex infection 04/21/2014    Past Surgical History:  Procedure Laterality Date   BREAST BIOPSY Left 12/2021   CERVICAL CERCLAGE  2010 and 2004   [employer] cerclage   CESAREAN SECTION  10/10/2010   [facility]   HERNIA REPAIR     umbilical   UMBILICAL HERNIA REPAIR  01/30/2012   Procedure: HERNIA REPAIR UMBILICAL ADULT;  Surgeon: Thresa JAYSON Pulling, MD;  Location: [facility];  Service: General;  Laterality: N/A;  With mesh    OB History     Gravida  5   Para  3   Term      Preterm      AB  2   Living  3      SAB  2   IAB      Ectopic      Multiple      Live Births               Home Medications    Prior to Admission medications   Medication Sig Start Date End Date Taking? Authorizing Provider  meloxicam  (MOBIC ) 15 MG tablet Take 1 tablet (15 mg total) by mouth daily. 08/19/24  Yes Laveah Gloster K, PA-C  fluticasone (FLONASE) 50 MCG/ACT nasal spray Place 1 spray into both nostrils daily as needed for allergies.    [provider]  lidocaine  (LIDODERM ) 5 % Place 1 patch onto the skin daily. Remove & Discard patch within 12 hours or as directed by MD 02/27/23   Suellen Cantor A, PA-C  PARoxetine  (PAXIL ) 10 MG tablet Take 1 tablet  (10 mg total) by mouth daily. 11/28/23   Jaycee Greig PARAS, NP  PARoxetine  Mesylate 7.5 MG CAPS Take 7.5 mg by mouth at bedtime. 10/27/23   Jaycee Greig PARAS, NP  Vitamin D , Ergocalciferol , (DRISDOL ) 1.25 MG (50000 UNIT) CAPS capsule Take 1 capsule (50,000 Units total) by mouth every 7 (seven) days. 08/01/23   Bacchus, Meade PEDLAR, FNP  cetirizine  (ZYRTEC ) 10 MG tablet Take 1 tablet (10 mg total) by mouth daily as needed for allergies. 09/18/20 02/02/21  Signa Delon LABOR, NP    Family History Family History  Problem Relation Age of Onset   Diabetes Mother    Hypertension Mother    Diabetes Father    Hypertension Father    Multiple sclerosis Father    Cancer Maternal Grandfather        prostate   Asthma Daughter    Asthma Daughter    Anesthesia problems Neg Hx    Hypotension Neg Hx    Malignant hyperthermia Neg Hx    Pseudochol deficiency Neg Hx    Colon cancer Neg Hx    Esophageal cancer Neg Hx    Stomach cancer Neg Hx    Rectal cancer Neg Hx     Social History Social History   Tobacco Use   Smoking status: Never    Passive exposure: Never   Smokeless tobacco: Never  Vaping Use   Vaping status: Never Used  Substance Use Topics   Alcohol use: Yes    Comment: special occasions   Drug use: Never     Allergies   Sulfa antibiotics   Review of Systems Review of Systems  Constitutional:  Positive for activity change. Negative for appetite change, fatigue and fever.  Gastrointestinal:  Negative for diarrhea, nausea and vomiting.  Musculoskeletal:  Positive for arthralgias. Negative for gait problem, joint swelling and myalgias.  Skin:  Negative for color change and wound.  Neurological:  Negative for weakness and numbness.     Physical Exam Triage Vital Signs ED Triage Vitals  Encounter Vitals Group     BP 08/19/24 1631 138/88     Girls Systolic BP Percentile --      Girls Diastolic BP Percentile --      Boys Systolic BP Percentile --      Boys Diastolic BP Percentile  --      Pulse Rate 08/19/24 1631 70     Resp 08/19/24 1631 18     Temp 08/19/24 1631 97.9 F (36.6 C)     Temp Source 08/19/24 1631 Oral     SpO2 08/19/24 1631 98 %  Weight --      Height --      Head Circumference --      Peak Flow --      Pain Score 08/19/24 1629 4     Pain Loc --      Pain Education --      Exclude from Growth Chart --    No data found.  Updated Vital Signs BP 138/88 (BP Location: Left Arm)   Pulse 70   Temp 97.9 F (36.6 C) (Oral)   Resp 18   LMP 11/18/2021 (Approximate)   SpO2 98%   Visual Acuity Right Eye Distance:   Left Eye Distance:   Bilateral Distance:    Right Eye Near:   Left Eye Near:    Bilateral Near:     Physical Exam Vitals reviewed.  Constitutional:      General: She is awake. She is not in acute distress.    Appearance: Normal appearance. She is well-developed. She is not ill-appearing.     Comments: Very pleasant female appears stated age in no acute distress sitting comfortably in exam room  HENT:     Head: Normocephalic and atraumatic.  Cardiovascular:     Rate and Rhythm: Normal rate and regular rhythm.     Heart sounds: Normal heart sounds, S1 normal and S2 normal. No murmur heard.    Comments: Capillary refill within 2 seconds bilateral hands Pulmonary:     Effort: Pulmonary effort is normal.     Breath sounds: Normal breath sounds. No wheezing, rhonchi or rales.     Comments: Clear auscultation bilaterally Musculoskeletal:     Right hand: Swelling and tenderness present. No deformity or bony tenderness. Normal range of motion. Normal sensation. There is no disruption of two-point discrimination. Normal capillary refill.     Comments: Right hand: No deformity noted.  Mild tenderness palpation over fifth, fourth, third metacarpal.  Hand is neurovascular intact.  Negative Finkelstein.  Normal active range of motion at fingers and wrist.  Normal pincer and grip strength.  Psychiatric:        Behavior: Behavior is  cooperative.      UC Treatments / Results  Labs (all labs ordered are listed, but only abnormal results are displayed) Labs Reviewed - No data to display  EKG   Radiology No results found.  Procedures Procedures (including critical care time)  Medications Ordered in UC Medications - No data to display  Initial Impression / Assessment and Plan / UC Course  I have reviewed the triage vital signs and the nursing notes.  Pertinent labs & imaging results that were available during my care of the patient were reviewed by me and considered in my medical decision making (see chart for details).     Patient is well-appearing, afebrile, nontoxic, nontachycardic.  Hand is neurovascularly intact.  Plain films were deferred as she had normal x-rays a few days ago and has not had additional trauma with no focal bony tenderness.  I suspect she sprained her hand and recommended that she use RICE protocol for symptom relief.  She was placed in a brace for comfort and support.  Will start meloxicam  after she has completed course of prednisone  we discussed that she should not combine this with additional NSAIDs due to risk of GI bleeding including aspirin, ibuprofen /Advil , naproxen /Aleve , previously prescribed diclofenac .  No indication for dose adjustment based on metabolic panel from 07/12/2023 with a creatinine of 0.86 and calculated creatinine clearance of 92 mL/min.  We  did discuss that she should avoid strenuous activity including heavy lifting and was provided work excuse note for several days until her symptoms improved.  If her symptoms are not improving despite conservative treatment she is to follow-up with hand specialist and was given the contact information for local provider with instruction to call to schedule an appointment.  Strict return precautions given.  Excuse note provided.  Final Clinical Impressions(s) / UC Diagnoses   Final diagnoses:  Hand sprain, right, subsequent encounter   Right hand pain     Discharge Instructions      Start Mobic  daily.  Do not take additional NSAIDs with this medication including aspirin, ibuprofen /Advil , naproxen /Aleve , diclofenac /Voltaren .  You can use acetaminophen /Tylenol  for breakthrough pain.  This should be started after you have been off the steroids for 24 hours (prednisone ).  Use the brace for comfort and support.  I suggest putting ice on your hand 15 minutes at a time 3-4 times a day.  If your symptoms are not improving you may benefit from seeing a hand specialist; call to schedule an appointment.  If anything worsens you have increasing pain, swelling, fever, numbness or tingling in the hand you need to be seen immediately.     ED Prescriptions     Medication Sig Dispense Auth. Provider   meloxicam  (MOBIC ) 15 MG tablet Take 1 tablet (15 mg total) by mouth daily. 30 tablet Juelle Dickmann K, PA-C      PDMP not reviewed this encounter.   Sherrell Rocky POUR, PA-C 08/19/24 1718    Sherrell Rocky POUR, PA-C 08/19/24 1719

## 2024-08-19 NOTE — Discharge Instructions (Signed)
 Start Mobic  daily.  Do not take additional NSAIDs with this medication including aspirin, ibuprofen /Advil , naproxen /Aleve , diclofenac /Voltaren .  You can use acetaminophen /Tylenol  for breakthrough pain.  This should be started after you have been off the steroids for 24 hours (prednisone ).  Use the brace for comfort and support.  I suggest putting ice on your hand 15 minutes at a time 3-4 times a day.  If your symptoms are not improving you may benefit from seeing a hand specialist; call to schedule an appointment.  If anything worsens you have increasing pain, swelling, fever, numbness or tingling in the hand you need to be seen immediately.

## 2024-08-22 ENCOUNTER — Ambulatory Visit (HOSPITAL_COMMUNITY)

## 2024-08-23 ENCOUNTER — Encounter (HOSPITAL_COMMUNITY): Payer: Self-pay

## 2024-08-23 ENCOUNTER — Ambulatory Visit (HOSPITAL_COMMUNITY)
Admission: RE | Admit: 2024-08-23 | Discharge: 2024-08-23 | Disposition: A | Discharge status: Home / Self Care | Source: Ambulatory Visit | Attending: Family Medicine | Admitting: Family Medicine

## 2024-08-23 VITALS — BP 135/92 | HR 78 | Temp 97.4°F | Resp 16

## 2024-08-23 DIAGNOSIS — N898 Other specified noninflammatory disorders of vagina: Secondary | ICD-10-CM

## 2024-08-23 DIAGNOSIS — R350 Frequency of micturition: Secondary | ICD-10-CM | POA: Diagnosis not present

## 2024-08-23 LAB — POCT URINE DIPSTICK
Bilirubin, UA: NEGATIVE
Blood, UA: NEGATIVE
Glucose, UA: NEGATIVE mg/dL
Ketones, POC UA: NEGATIVE mg/dL
Leukocytes, UA: NEGATIVE
Nitrite, UA: NEGATIVE
POC PROTEIN,UA: NEGATIVE
Spec Grav, UA: 1.01 (ref 1.010–1.025)
Urobilinogen, UA: 0.2 U/dL
pH, UA: 6.5 (ref 5.0–8.0)

## 2024-08-23 MED ORDER — FLUCONAZOLE 150 MG PO TABS: 150.0000 mg | ORAL_TABLET | Freq: Once | ORAL | 0 refills | Status: AC

## 2024-08-23 NOTE — ED Provider Notes (Signed)
 [facility]    CSN: [id number] Arrival date & time: 08/23/24  1200      History   Chief Complaint Chief Complaint  Patient presents with   Urinary Frequency    Need to see if I got an uti - Entered by patient    HPI Donna [name] is a 53 y.o. female.   Patient presents today with a weeklong history of urinary frequency.  She reports some urinary urgency as well as general irritation in her vulva.  She has not experienced any vaginal discharge, genital lesion, fever, abdominal pain, nausea, vomiting.  Her symptoms began after she was prescribed prednisone  for musculoskeletal injury.  She completed course several days ago but continues to have urinary symptoms.  She denies any history of diabetes and does not take an SGLT2 inhibitor.  Denies any recent antibiotics.  Denies any recent catheterization, single kidney, history of nephrolithiasis.  She has no concern for STI but has had a history of BV and yeast.  She has not tried any over the medications for symptom management.  She does report that she thinks she is going through menopause as her last menstrual cycle was over a year ago and does not know if this could be contributing to her symptoms.  She is set to establish with a primary care in December and intends to discuss this with them.    Past Medical History:  Diagnosis Date   Allergy    Anemia    Anxiety    Arthritis    Contraceptive management 04/21/2014   Environmental allergies 12/30/2014   Herpes    History of anemia 05/05/2016   History of herpes simplex infection 04/21/2014   Irregular bleeding 05/05/2015   Itching in the vaginal area 05/05/2016   Retained tampon 05/05/2015   Umbilical hernia 12/30/2014   Vaginal discharge 05/05/2015    Patient Active Problem List   Diagnosis Date Noted   Right knee pain 04/24/2023   Left knee pain 04/24/2023   Back pain 03/10/2023   Acute vaginitis 04/21/2022   Encounter for annual general medical  examination with abnormal findings in adult 01/21/2022   Atopic dermatitis in adult 01/21/2022   Seasonal allergies 01/21/2022   Caregiver stress 01/21/2022   Depression, major, single episode, 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   Trichomoniasis 10/15/2019   Vaginal odor 10/09/2019   Vaginal bleeding 10/09/2019   Screening examination for STD (sexually transmitted disease) 10/09/2019   Elevated BP without diagnosis of hypertension 10/09/2019   Iron deficiency anemia due to chronic blood loss 10/09/2019   Uterine fibroids 09/11/2017   Screening for colorectal cancer 06/27/2017   History of anemia 05/05/2016   Irregular bleeding 05/05/2015   Retained tampon 05/05/2015   Umbilical hernia 12/30/2014   Environmental allergies 12/30/2014   History of herpes simplex infection 04/21/2014    Past Surgical History:  Procedure Laterality Date   BREAST BIOPSY Left 12/2021   CERVICAL CERCLAGE  2010 and 2004   [employer] cerclage   CESAREAN SECTION  10/10/2010   [facility]   HERNIA REPAIR     umbilical   UMBILICAL HERNIA REPAIR  01/30/2012   Procedure: HERNIA REPAIR UMBILICAL ADULT;  Surgeon: Thresa JAYSON Pulling, MD;  Location: [facility];  Service: General;  Laterality: N/A;  With mesh    OB History     Gravida  5   Para  3   Term      Preterm      AB  2   Living  3      SAB  2   IAB      Ectopic      Multiple      Live Births               Home Medications    Prior to Admission medications   Medication Sig Start Date End Date Taking? Authorizing Provider  diclofenac  (VOLTAREN ) 75 MG EC tablet Take 75 mg by mouth 2 (two) times daily as needed for moderate pain (pain score 4-6). 08/07/24  Yes [provider]  fluconazole (DIFLUCAN) 150 MG tablet Take 1 tablet (150 mg total) by mouth once for 1 dose. 08/23/24 08/23/24 Yes Square Jowett K, PA-C  fluticasone (FLONASE) 50 MCG/ACT nasal spray Place 1 spray into both nostrils daily as needed for allergies.    [provider]   meloxicam  (MOBIC ) 15 MG tablet Take 1 tablet (15 mg total) by mouth daily. 08/19/24   Mc Bloodworth K, PA-C  Vitamin D , Ergocalciferol , (DRISDOL ) 1.25 MG (50000 UNIT) CAPS capsule Take 1 capsule (50,000 Units total) by mouth every 7 (seven) days. 08/01/23   Bacchus, Meade PEDLAR, FNP  cetirizine  (ZYRTEC ) 10 MG tablet Take 1 tablet (10 mg total) by mouth daily as needed for allergies. 09/18/20 02/02/21  Signa Delon LABOR, NP    Family History Family History  Problem Relation Age of Onset   Diabetes Mother    Hypertension Mother    Diabetes Father    Hypertension Father    Multiple sclerosis Father    Cancer Maternal Grandfather        prostate   Asthma Daughter    Asthma Daughter    Anesthesia problems Neg Hx    Hypotension Neg Hx    Malignant hyperthermia Neg Hx    Pseudochol deficiency Neg Hx    Colon cancer Neg Hx    Esophageal cancer Neg Hx    Stomach cancer Neg Hx    Rectal cancer Neg Hx     Social History Social History   Tobacco Use   Smoking status: Never    Passive exposure: Never   Smokeless tobacco: Never  Vaping Use   Vaping status: Never Used  Substance Use Topics   Alcohol use: Yes    Comment: special occasions   Drug use: Never     Allergies   Sulfa antibiotics   Review of Systems Review of Systems  Constitutional:  Positive for activity change. Negative for appetite change, fatigue and fever.  Gastrointestinal:  Negative for anal bleeding, diarrhea, nausea and vomiting.  Genitourinary:  Positive for frequency and urgency. Negative for dysuria, flank pain, vaginal bleeding, vaginal discharge and vaginal pain.     Physical Exam Triage Vital Signs ED Triage Vitals [08/23/24 1211]  Encounter Vitals Group     BP (!) 135/92     Girls Systolic BP Percentile      Girls Diastolic BP Percentile      Boys Systolic BP Percentile      Boys Diastolic BP Percentile      Pulse Rate 78     Resp 16     Temp (!) 97.4 F (36.3 C)     Temp Source Oral      SpO2 96 %     Weight      Height      Head Circumference      Peak Flow      Pain Score 0     Pain Loc  Pain Education      Exclude from Growth Chart    No data found.  Updated Vital Signs BP (!) 135/92 (BP Location: Left Arm)   Pulse 78   Temp (!) 97.4 F (36.3 C) (Oral)   Resp 16   LMP 11/18/2021 (Approximate)   SpO2 96%   Visual Acuity Right Eye Distance:   Left Eye Distance:   Bilateral Distance:    Right Eye Near:   Left Eye Near:    Bilateral Near:     Physical Exam Vitals reviewed.  Constitutional:      General: She is awake. She is not in acute distress.    Appearance: Normal appearance. She is well-developed. She is not ill-appearing.     Comments: Very pleasant female appears stated age in no acute distress sitting comfortably in exam room  HENT:     Head: Normocephalic and atraumatic.  Cardiovascular:     Rate and Rhythm: Normal rate and regular rhythm.     Heart sounds: Normal heart sounds, S1 normal and S2 normal. No murmur heard. Pulmonary:     Effort: Pulmonary effort is normal.     Breath sounds: Normal breath sounds. No wheezing, rhonchi or rales.     Comments: Clear to auscultation bilaterally Abdominal:     General: Bowel sounds are normal.     Palpations: Abdomen is soft.     Tenderness: There is no abdominal tenderness. There is no right CVA tenderness, left CVA tenderness, guarding or rebound.     Comments: Benign abdominal exam  Psychiatric:        Behavior: Behavior is cooperative.      UC Treatments / Results  Labs (all labs ordered are listed, but only abnormal results are displayed) Labs Reviewed  POCT URINE DIPSTICK  CERVICOVAGINAL ANCILLARY ONLY    EKG   Radiology No results found.  Procedures Procedures (including critical care time)  Medications Ordered in UC Medications - No data to display  Initial Impression / Assessment and Plan / UC Course  I have reviewed the triage vital signs and the nursing  notes.  Pertinent labs & imaging results that were available during my care of the patient were reviewed by me and considered in my medical decision making (see chart for details).     Patient is well-appearing, afebrile, nontoxic, nontachycardic.  Her UA was normal with no evidence of infection.  She does report some vaginal irritation discussed that it is possible that this was the beginning of a vaginal yeast infection triggered by the prednisone  use.  She was encouraged use hypoallergenic soaps and detergents and will treat with 1 dose of Diflucan which was sent to the pharmacy.  Swab was collected and we will contact her if she is actually positive for BV or requires additional treatment for yeast.  She declined STI testing.  We also discussed that her symptoms could be related to atrophic vaginitis as she thinks that she has been through menopause.  She has an appointment scheduled with primary care to investigate menopause symptoms further and will strongly encouraged to keep this appointment.  We discussed that if anything worsens or changes she should be seen immediately.  Strict return precautions given.  Excuse note provided.  Final Clinical Impressions(s) / UC Diagnoses   Final diagnoses:  Vaginal irritation  Urinary frequency     Discharge Instructions      Your urine was normal.  I believe that your symptoms are related to a vaginal yeast  infection.  Take Diflucan.  Will hypoallergenic soaps and detergents and wear loosefitting cotton underwear.  I will contact you if your swab is abnormal and changes our treatment plan.  It is also possible your symptoms are related to menopause so follow-up with your primary care schedule.  If anything changes or worsens please return for reevaluation.     ED Prescriptions     Medication Sig Dispense Auth. Provider   fluconazole (DIFLUCAN) 150 MG tablet Take 1 tablet (150 mg total) by mouth once for 1 dose. 1 tablet Nadir Vasques K, PA-C       PDMP not reviewed this encounter.   Sherrell Rocky POUR, PA-C 08/23/24 1327

## 2024-08-23 NOTE — ED Triage Notes (Signed)
 Patient here today with c/o urinary frequency X 1 week. Patient states that it is worsening. Patient states that she is going through menopause and was recently on Prednisone  and thinks that it could be part of that.

## 2024-08-23 NOTE — Discharge Instructions (Signed)
 Your urine was normal.  I believe that your symptoms are related to a vaginal yeast infection.  Take Diflucan.  Will hypoallergenic soaps and detergents and wear loosefitting cotton underwear.  I will contact you if your swab is abnormal and changes our treatment plan.  It is also possible your symptoms are related to menopause so follow-up with your primary care schedule.  If anything changes or worsens please return for reevaluation.

## 2024-08-26 ENCOUNTER — Ambulatory Visit

## 2024-08-26 LAB — CERVICOVAGINAL ANCILLARY ONLY
Bacterial Vaginitis (gardnerella): NEGATIVE
Candida Glabrata: NEGATIVE
Candida Vaginitis: NEGATIVE
Comment: NEGATIVE
Comment: NEGATIVE
Comment: NEGATIVE

## 2024-08-27 ENCOUNTER — Ambulatory Visit (HOSPITAL_COMMUNITY)

## 2024-08-28 ENCOUNTER — Ambulatory Visit (HOSPITAL_BASED_OUTPATIENT_CLINIC_OR_DEPARTMENT_OTHER): Admitting: Orthopaedic Surgery

## 2024-09-18 ENCOUNTER — Encounter

## 2024-09-19 ENCOUNTER — Ambulatory Visit

## 2024-09-19 ENCOUNTER — Ambulatory Visit (HOSPITAL_COMMUNITY)

## 2024-09-19 ENCOUNTER — Telehealth

## 2024-10-19 ENCOUNTER — Ambulatory Visit (HOSPITAL_COMMUNITY)

## 2024-10-31 ENCOUNTER — Telehealth
